# Patient Record
Sex: Male | Born: 1950
Health system: Southern US, Community
[De-identification: ages and names within clinical notes are randomized; demographics above are authoritative.]

## PROBLEM LIST (undated history)

## (undated) DIAGNOSIS — E119 Type 2 diabetes mellitus without complications: Secondary | ICD-10-CM

## (undated) DIAGNOSIS — E7800 Pure hypercholesterolemia, unspecified: Secondary | ICD-10-CM

## (undated) DIAGNOSIS — R0600 Dyspnea, unspecified: Secondary | ICD-10-CM

## (undated) DIAGNOSIS — I10 Essential (primary) hypertension: Secondary | ICD-10-CM

## (undated) DIAGNOSIS — J449 Chronic obstructive pulmonary disease, unspecified: Secondary | ICD-10-CM

## (undated) DIAGNOSIS — H409 Unspecified glaucoma: Secondary | ICD-10-CM

## (undated) DIAGNOSIS — E109 Type 1 diabetes mellitus without complications: Secondary | ICD-10-CM

## (undated) DIAGNOSIS — C61 Malignant neoplasm of prostate: Secondary | ICD-10-CM

## (undated) DIAGNOSIS — Z973 Presence of spectacles and contact lenses: Secondary | ICD-10-CM

## (undated) HISTORY — PX: PROSTATE BIOPSY: SHX241

## (undated) HISTORY — PX: NO PAST SURGERIES: SHX2092

---

## 2001-07-19 ENCOUNTER — Ambulatory Visit (HOSPITAL_COMMUNITY): Admission: RE | Admit: 2001-07-19 | Discharge: 2001-07-19 | Payer: Self-pay | Admitting: Family Medicine

## 2001-07-19 ENCOUNTER — Encounter: Payer: Self-pay | Admitting: Family Medicine

## 2004-11-26 ENCOUNTER — Ambulatory Visit (HOSPITAL_COMMUNITY): Admission: RE | Admit: 2004-11-26 | Discharge: 2004-11-26 | Payer: Self-pay | Admitting: Family Medicine

## 2008-03-04 ENCOUNTER — Encounter: Payer: Self-pay | Admitting: Internal Medicine

## 2008-03-04 ENCOUNTER — Ambulatory Visit (HOSPITAL_COMMUNITY): Admission: RE | Admit: 2008-03-04 | Discharge: 2008-03-04 | Payer: Self-pay | Admitting: Family Medicine

## 2008-03-10 ENCOUNTER — Ambulatory Visit (HOSPITAL_COMMUNITY): Admission: RE | Admit: 2008-03-10 | Discharge: 2008-03-10 | Payer: Self-pay | Admitting: Family Medicine

## 2008-03-10 ENCOUNTER — Encounter: Payer: Self-pay | Admitting: Internal Medicine

## 2008-03-24 ENCOUNTER — Ambulatory Visit: Payer: Self-pay | Admitting: Internal Medicine

## 2008-03-24 DIAGNOSIS — J449 Chronic obstructive pulmonary disease, unspecified: Secondary | ICD-10-CM | POA: Insufficient documentation

## 2008-03-24 DIAGNOSIS — J438 Other emphysema: Secondary | ICD-10-CM | POA: Insufficient documentation

## 2008-03-24 DIAGNOSIS — J4489 Other specified chronic obstructive pulmonary disease: Secondary | ICD-10-CM | POA: Insufficient documentation

## 2008-03-24 DIAGNOSIS — J189 Pneumonia, unspecified organism: Secondary | ICD-10-CM | POA: Insufficient documentation

## 2008-03-24 DIAGNOSIS — E785 Hyperlipidemia, unspecified: Secondary | ICD-10-CM | POA: Insufficient documentation

## 2008-04-10 ENCOUNTER — Ambulatory Visit: Payer: Self-pay | Admitting: Internal Medicine

## 2008-04-10 DIAGNOSIS — J984 Other disorders of lung: Secondary | ICD-10-CM

## 2008-07-21 ENCOUNTER — Telehealth (INDEPENDENT_AMBULATORY_CARE_PROVIDER_SITE_OTHER): Payer: Self-pay | Admitting: *Deleted

## 2008-07-29 ENCOUNTER — Encounter: Payer: Self-pay | Admitting: Internal Medicine

## 2008-08-04 ENCOUNTER — Ambulatory Visit (HOSPITAL_COMMUNITY): Admission: RE | Admit: 2008-08-04 | Discharge: 2008-08-04 | Payer: Self-pay | Admitting: Internal Medicine

## 2009-10-09 IMAGING — CR DG CHEST 2V
2 series · 2 of 2 positions shown · non-contrast
Comparison: None

CLINICAL DATA: Cough, wheezing, sore throat, fever

CHEST - 2 VIEW

[view not recorded (1 of 2)]
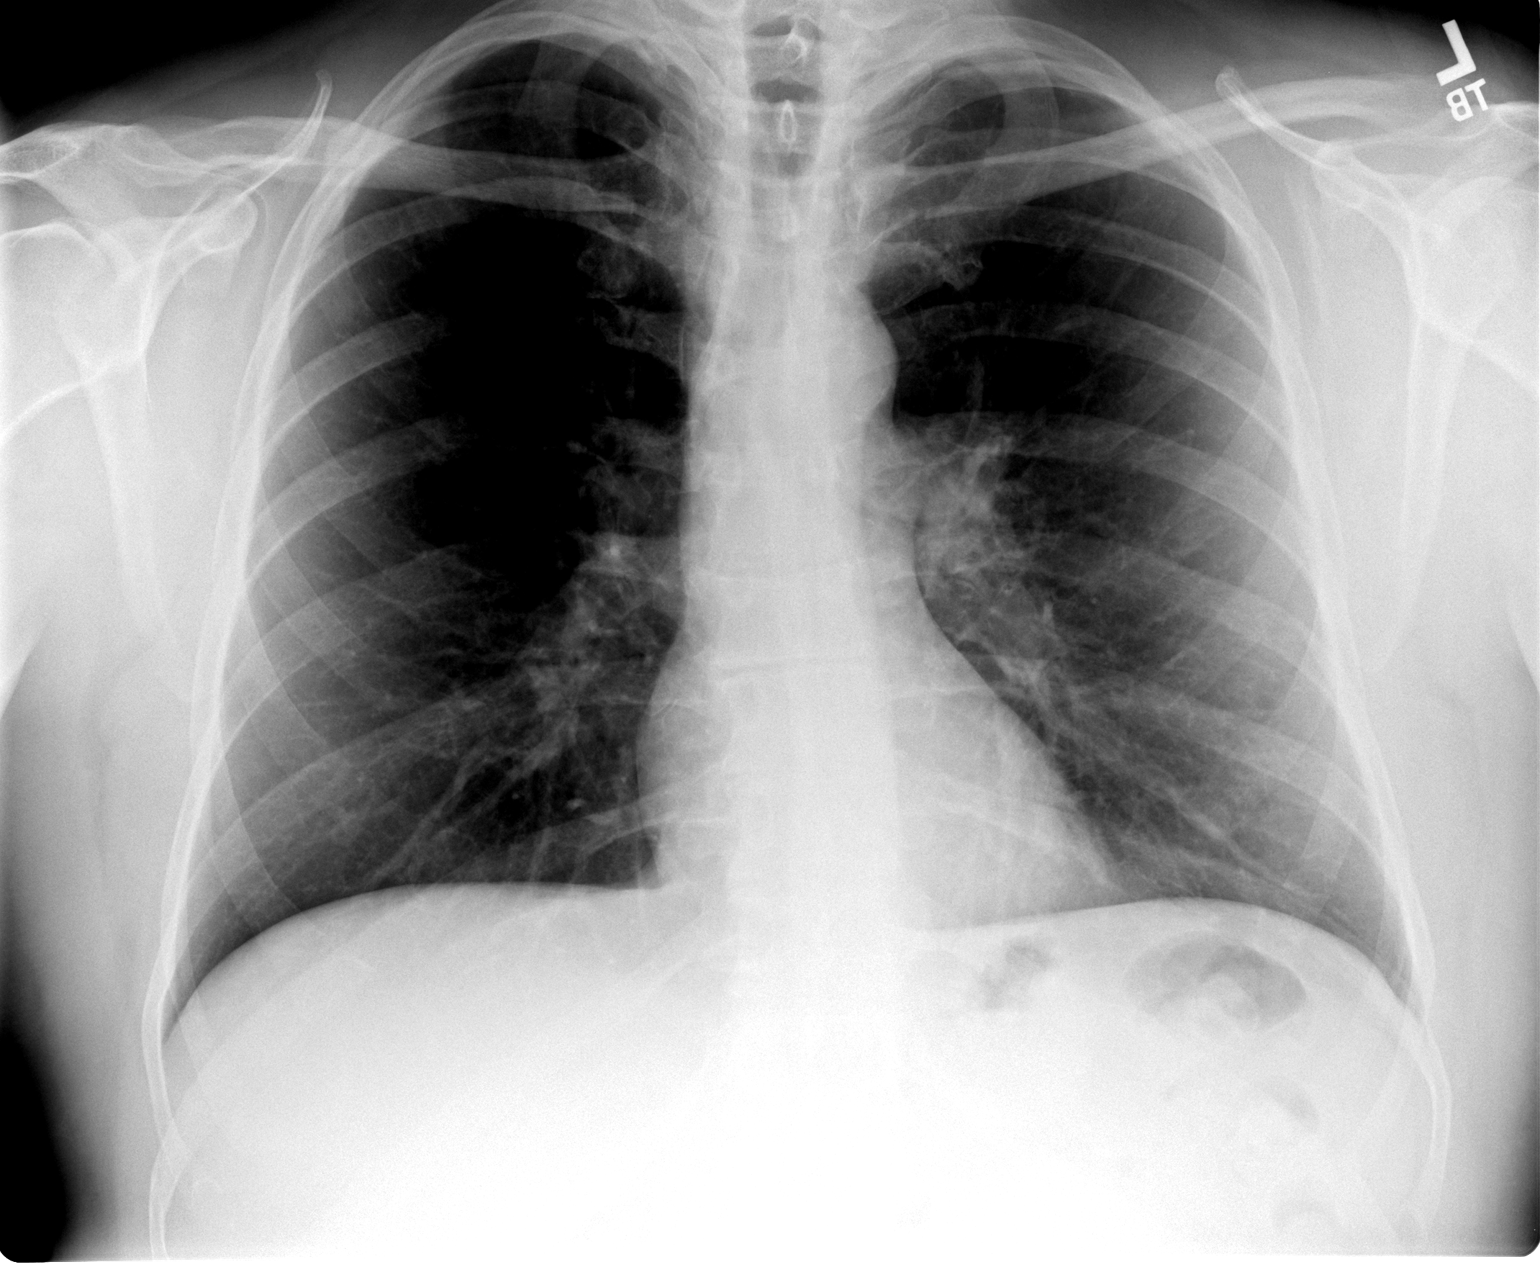

[view not recorded (2 of 2)]
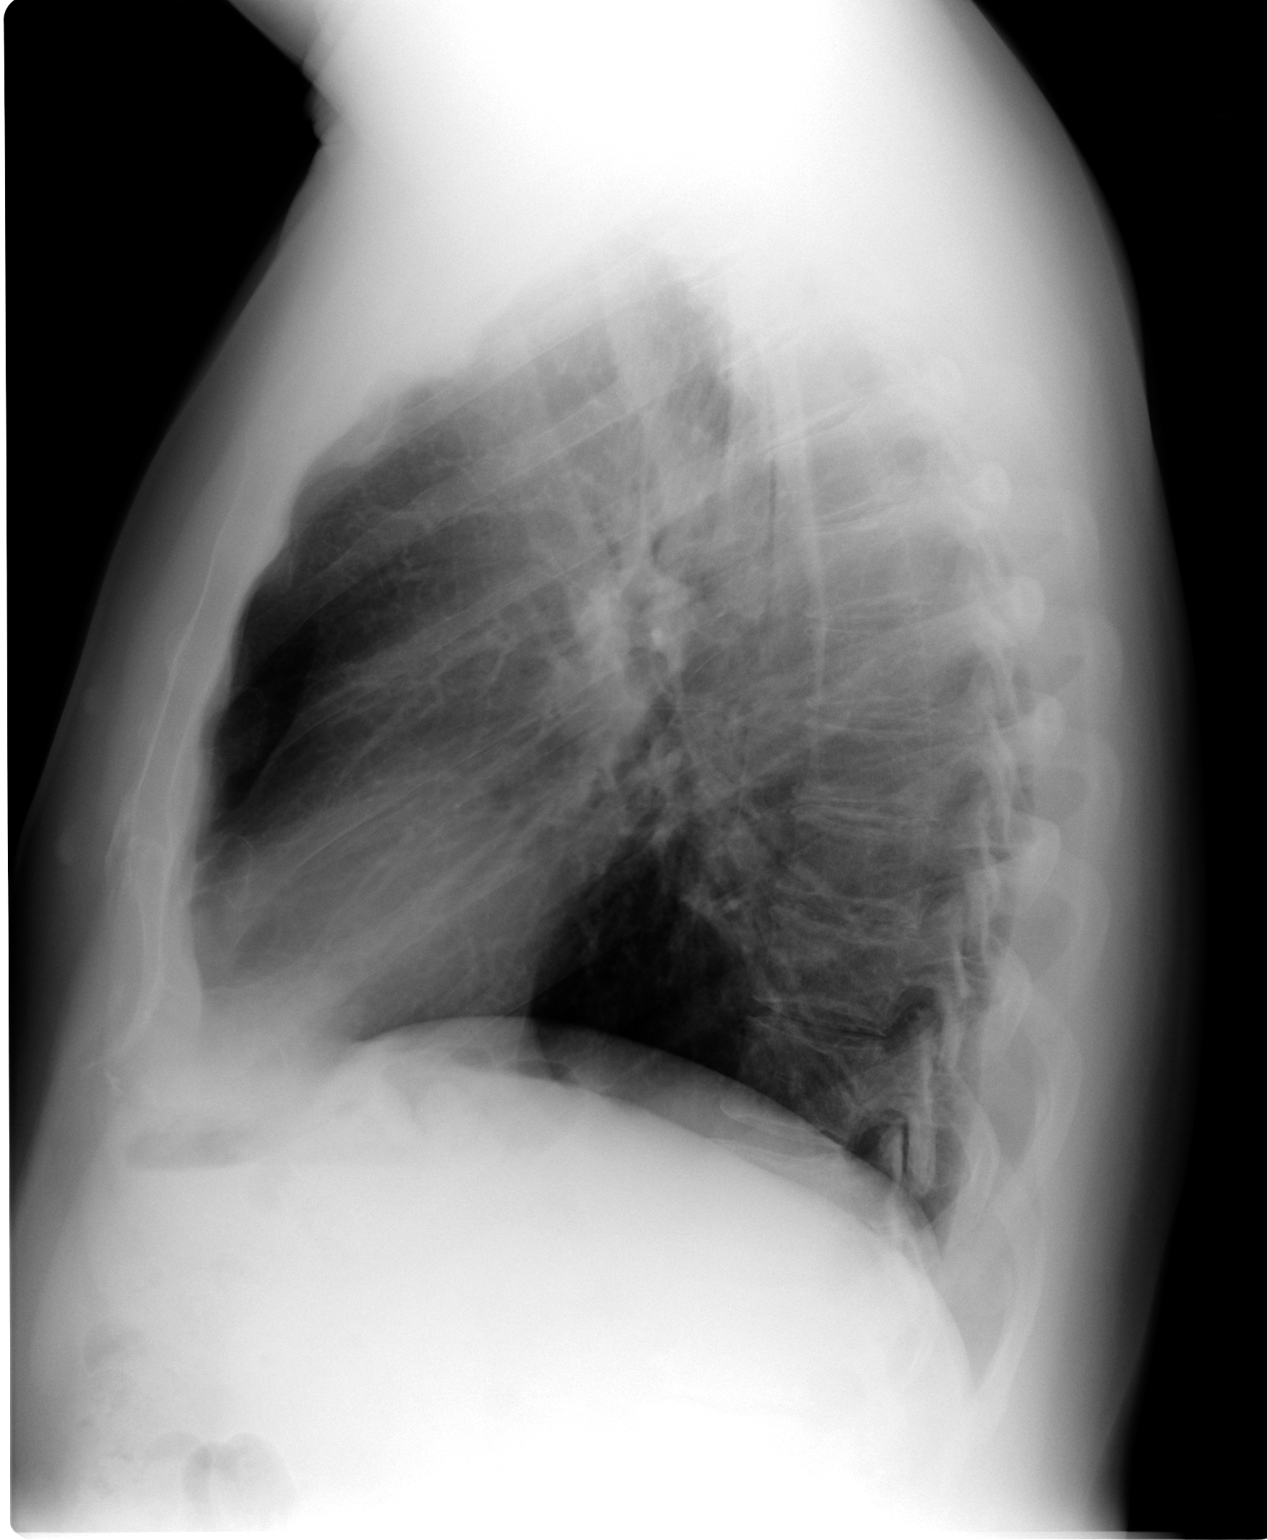

[2 of 2 positions shown; findings below may reference images not displayed]

FINDINGS: Normal heart size.
Slightly prominent left pulmonary hilum.
Intrapulmonary vascular markings normal.
No pulmonary infiltrate or pleural effusion.
Minimal bronchitic changes.
Bones unremarkable.
IMPRESSION: Minimal bronchitic changes.
Prominence of left pulmonary hilum, potentially related to vascular
markings but adenopathy not completely excluded.
If the patient has prior outside chest radiographs, recommend these
be obtained for comparison.
In the absence of prior studies, recommend CT chest with contrast
to exclude hilar adenopathy.

## 2009-11-15 IMAGING — CR DG CHEST 2V
2 series · 2 of 2 positions shown · non-contrast
Comparison: 03/04/2008

CLINICAL DATA: Follow up pneumonia.

CHEST - 2 VIEW

[view not recorded (1 of 2)]
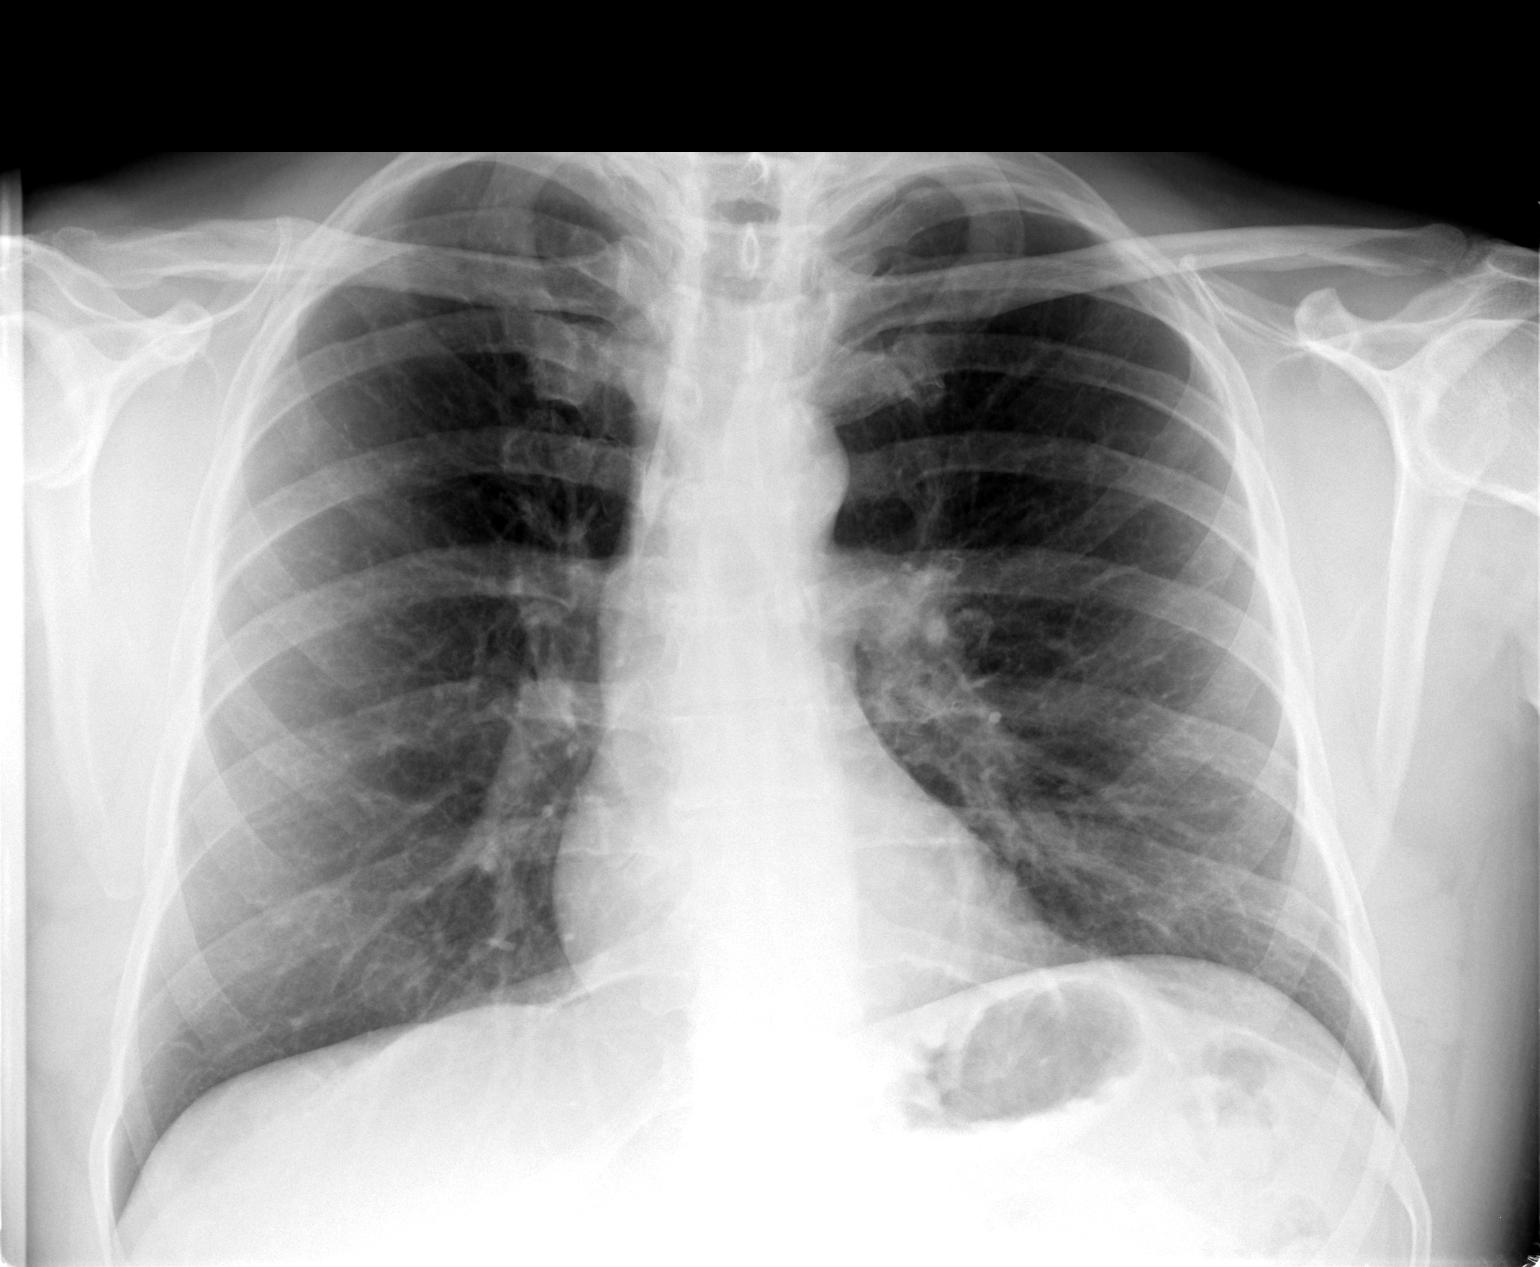

[view not recorded (2 of 2)]
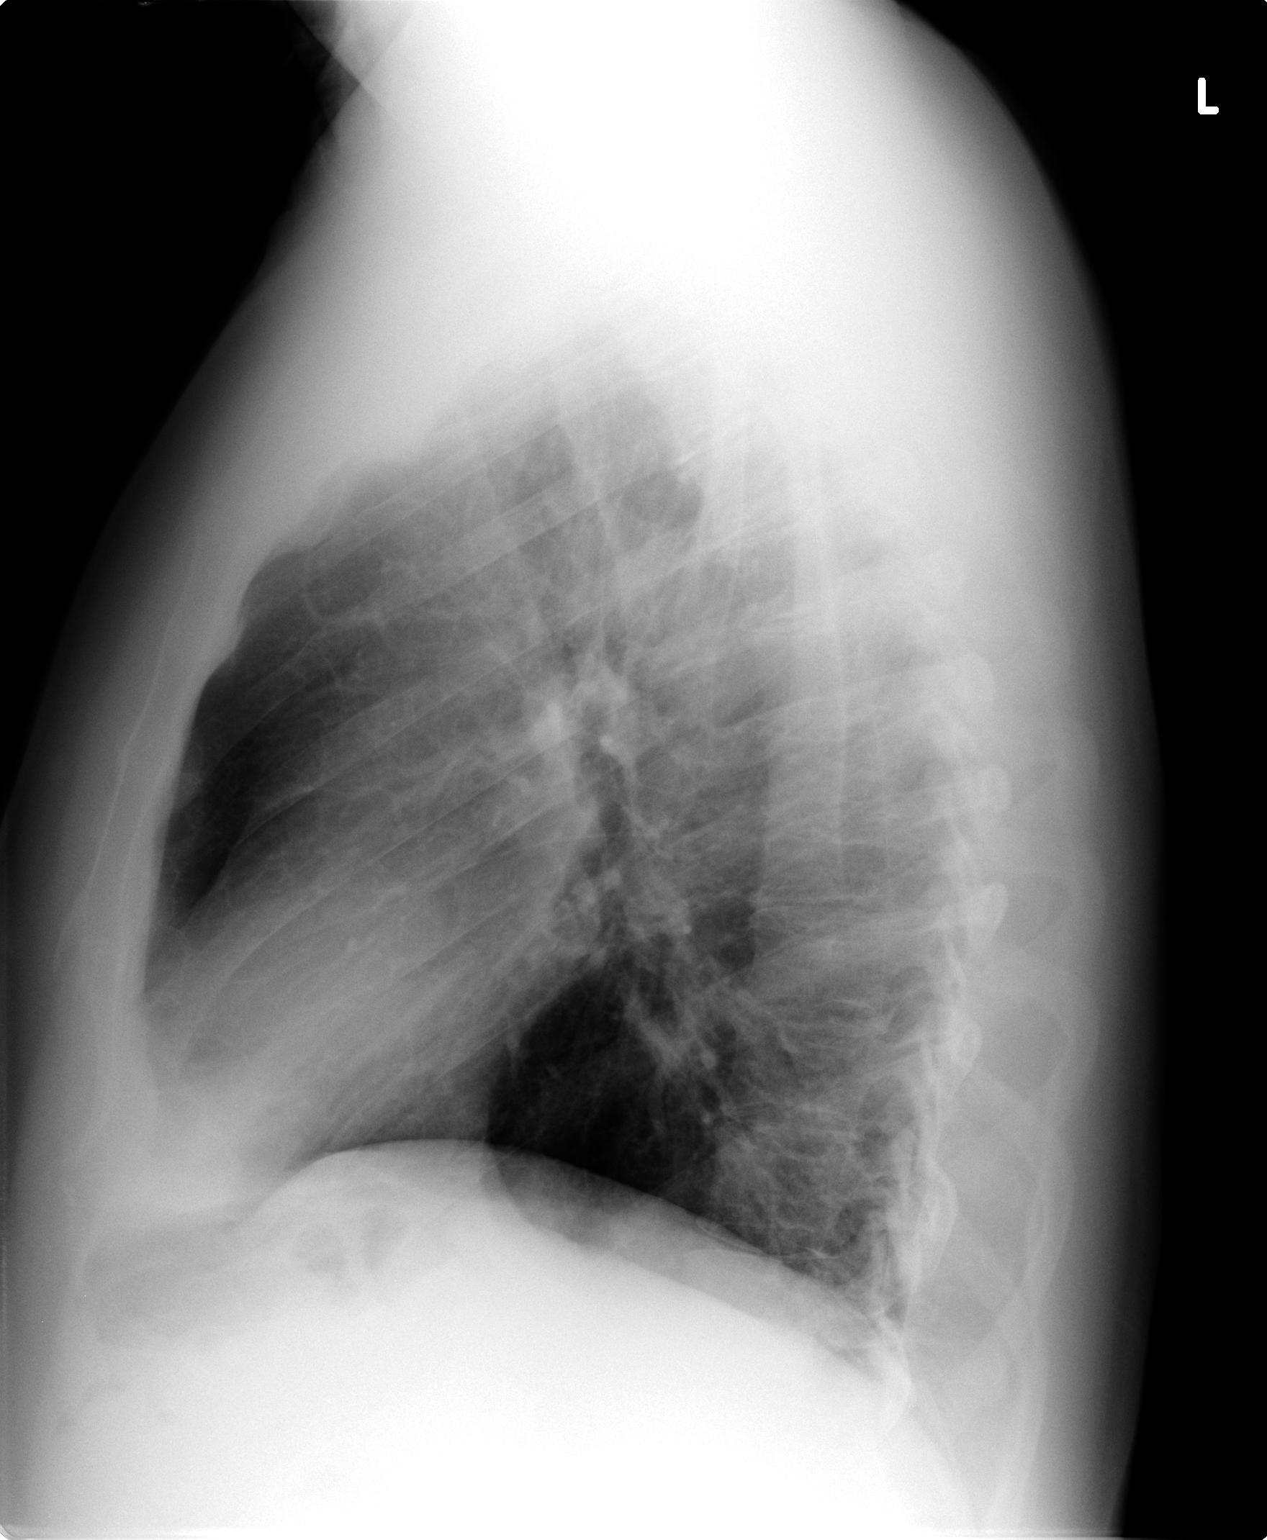

[2 of 2 positions shown; findings below may reference images not displayed]

FINDINGS: The heart size and mediastinal contours are within normal
limits.  Both lungs are clear.  The visualized skeletal structures
are unremarkable.
IMPRESSION: No active cardiopulmonary disease.

## 2010-12-08 ENCOUNTER — Ambulatory Visit (INDEPENDENT_AMBULATORY_CARE_PROVIDER_SITE_OTHER): Payer: Medicare Other | Admitting: Ophthalmology

## 2010-12-08 DIAGNOSIS — H2510 Age-related nuclear cataract, unspecified eye: Secondary | ICD-10-CM

## 2010-12-08 DIAGNOSIS — H43819 Vitreous degeneration, unspecified eye: Secondary | ICD-10-CM

## 2010-12-08 DIAGNOSIS — E11319 Type 2 diabetes mellitus with unspecified diabetic retinopathy without macular edema: Secondary | ICD-10-CM

## 2010-12-08 DIAGNOSIS — H3581 Retinal edema: Secondary | ICD-10-CM

## 2011-04-08 ENCOUNTER — Ambulatory Visit (INDEPENDENT_AMBULATORY_CARE_PROVIDER_SITE_OTHER): Payer: Medicare Other | Admitting: Ophthalmology

## 2011-08-02 DIAGNOSIS — E109 Type 1 diabetes mellitus without complications: Secondary | ICD-10-CM | POA: Insufficient documentation

## 2011-10-17 ENCOUNTER — Ambulatory Visit (INDEPENDENT_AMBULATORY_CARE_PROVIDER_SITE_OTHER): Payer: Managed Care, Other (non HMO) | Admitting: Ophthalmology

## 2011-10-17 DIAGNOSIS — E11319 Type 2 diabetes mellitus with unspecified diabetic retinopathy without macular edema: Secondary | ICD-10-CM

## 2011-10-17 DIAGNOSIS — E11359 Type 2 diabetes mellitus with proliferative diabetic retinopathy without macular edema: Secondary | ICD-10-CM

## 2011-10-17 DIAGNOSIS — H43819 Vitreous degeneration, unspecified eye: Secondary | ICD-10-CM

## 2011-10-17 DIAGNOSIS — E1039 Type 1 diabetes mellitus with other diabetic ophthalmic complication: Secondary | ICD-10-CM

## 2011-10-17 DIAGNOSIS — E11311 Type 2 diabetes mellitus with unspecified diabetic retinopathy with macular edema: Secondary | ICD-10-CM

## 2011-10-17 DIAGNOSIS — H35039 Hypertensive retinopathy, unspecified eye: Secondary | ICD-10-CM

## 2011-10-17 DIAGNOSIS — I10 Essential (primary) hypertension: Secondary | ICD-10-CM

## 2011-10-17 DIAGNOSIS — H2510 Age-related nuclear cataract, unspecified eye: Secondary | ICD-10-CM

## 2011-10-26 ENCOUNTER — Ambulatory Visit (INDEPENDENT_AMBULATORY_CARE_PROVIDER_SITE_OTHER): Payer: Managed Care, Other (non HMO) | Admitting: Ophthalmology

## 2011-10-26 DIAGNOSIS — E11311 Type 2 diabetes mellitus with unspecified diabetic retinopathy with macular edema: Secondary | ICD-10-CM

## 2011-10-26 DIAGNOSIS — H2510 Age-related nuclear cataract, unspecified eye: Secondary | ICD-10-CM

## 2011-10-26 DIAGNOSIS — H35039 Hypertensive retinopathy, unspecified eye: Secondary | ICD-10-CM

## 2011-10-26 DIAGNOSIS — E1039 Type 1 diabetes mellitus with other diabetic ophthalmic complication: Secondary | ICD-10-CM

## 2011-10-26 DIAGNOSIS — E11359 Type 2 diabetes mellitus with proliferative diabetic retinopathy without macular edema: Secondary | ICD-10-CM

## 2011-10-26 DIAGNOSIS — I10 Essential (primary) hypertension: Secondary | ICD-10-CM

## 2011-10-26 DIAGNOSIS — H43819 Vitreous degeneration, unspecified eye: Secondary | ICD-10-CM

## 2011-10-26 DIAGNOSIS — E11319 Type 2 diabetes mellitus with unspecified diabetic retinopathy without macular edema: Secondary | ICD-10-CM

## 2011-11-21 ENCOUNTER — Encounter (INDEPENDENT_AMBULATORY_CARE_PROVIDER_SITE_OTHER): Payer: Managed Care, Other (non HMO) | Admitting: Ophthalmology

## 2011-11-21 DIAGNOSIS — H35039 Hypertensive retinopathy, unspecified eye: Secondary | ICD-10-CM

## 2011-11-21 DIAGNOSIS — H43819 Vitreous degeneration, unspecified eye: Secondary | ICD-10-CM

## 2011-11-21 DIAGNOSIS — I10 Essential (primary) hypertension: Secondary | ICD-10-CM

## 2011-11-21 DIAGNOSIS — E1039 Type 1 diabetes mellitus with other diabetic ophthalmic complication: Secondary | ICD-10-CM

## 2011-11-21 DIAGNOSIS — E1065 Type 1 diabetes mellitus with hyperglycemia: Secondary | ICD-10-CM

## 2011-11-21 DIAGNOSIS — E11359 Type 2 diabetes mellitus with proliferative diabetic retinopathy without macular edema: Secondary | ICD-10-CM

## 2011-11-21 DIAGNOSIS — H2510 Age-related nuclear cataract, unspecified eye: Secondary | ICD-10-CM

## 2011-11-21 DIAGNOSIS — E11311 Type 2 diabetes mellitus with unspecified diabetic retinopathy with macular edema: Secondary | ICD-10-CM

## 2011-11-21 DIAGNOSIS — E11319 Type 2 diabetes mellitus with unspecified diabetic retinopathy without macular edema: Secondary | ICD-10-CM

## 2011-12-26 ENCOUNTER — Encounter (INDEPENDENT_AMBULATORY_CARE_PROVIDER_SITE_OTHER): Payer: Managed Care, Other (non HMO) | Admitting: Ophthalmology

## 2011-12-26 DIAGNOSIS — E11311 Type 2 diabetes mellitus with unspecified diabetic retinopathy with macular edema: Secondary | ICD-10-CM

## 2011-12-26 DIAGNOSIS — I10 Essential (primary) hypertension: Secondary | ICD-10-CM

## 2011-12-26 DIAGNOSIS — E1039 Type 1 diabetes mellitus with other diabetic ophthalmic complication: Secondary | ICD-10-CM

## 2011-12-26 DIAGNOSIS — H2510 Age-related nuclear cataract, unspecified eye: Secondary | ICD-10-CM

## 2011-12-26 DIAGNOSIS — E11359 Type 2 diabetes mellitus with proliferative diabetic retinopathy without macular edema: Secondary | ICD-10-CM

## 2011-12-26 DIAGNOSIS — E11319 Type 2 diabetes mellitus with unspecified diabetic retinopathy without macular edema: Secondary | ICD-10-CM

## 2011-12-26 DIAGNOSIS — H35039 Hypertensive retinopathy, unspecified eye: Secondary | ICD-10-CM

## 2012-01-23 ENCOUNTER — Encounter (INDEPENDENT_AMBULATORY_CARE_PROVIDER_SITE_OTHER): Payer: Managed Care, Other (non HMO) | Admitting: Ophthalmology

## 2012-01-23 DIAGNOSIS — E11311 Type 2 diabetes mellitus with unspecified diabetic retinopathy with macular edema: Secondary | ICD-10-CM

## 2012-01-23 DIAGNOSIS — H35039 Hypertensive retinopathy, unspecified eye: Secondary | ICD-10-CM

## 2012-01-23 DIAGNOSIS — H43819 Vitreous degeneration, unspecified eye: Secondary | ICD-10-CM

## 2012-01-23 DIAGNOSIS — E11319 Type 2 diabetes mellitus with unspecified diabetic retinopathy without macular edema: Secondary | ICD-10-CM

## 2012-01-23 DIAGNOSIS — H2510 Age-related nuclear cataract, unspecified eye: Secondary | ICD-10-CM

## 2012-01-23 DIAGNOSIS — E1039 Type 1 diabetes mellitus with other diabetic ophthalmic complication: Secondary | ICD-10-CM

## 2012-01-23 DIAGNOSIS — E1065 Type 1 diabetes mellitus with hyperglycemia: Secondary | ICD-10-CM

## 2012-01-23 DIAGNOSIS — I10 Essential (primary) hypertension: Secondary | ICD-10-CM

## 2012-01-23 DIAGNOSIS — E11359 Type 2 diabetes mellitus with proliferative diabetic retinopathy without macular edema: Secondary | ICD-10-CM

## 2012-02-20 ENCOUNTER — Encounter (INDEPENDENT_AMBULATORY_CARE_PROVIDER_SITE_OTHER): Payer: Managed Care, Other (non HMO) | Admitting: Ophthalmology

## 2012-02-20 DIAGNOSIS — E11319 Type 2 diabetes mellitus with unspecified diabetic retinopathy without macular edema: Secondary | ICD-10-CM

## 2012-02-20 DIAGNOSIS — H43819 Vitreous degeneration, unspecified eye: Secondary | ICD-10-CM

## 2012-02-20 DIAGNOSIS — E1065 Type 1 diabetes mellitus with hyperglycemia: Secondary | ICD-10-CM

## 2012-02-20 DIAGNOSIS — E11311 Type 2 diabetes mellitus with unspecified diabetic retinopathy with macular edema: Secondary | ICD-10-CM

## 2012-02-20 DIAGNOSIS — I10 Essential (primary) hypertension: Secondary | ICD-10-CM

## 2012-02-20 DIAGNOSIS — H35039 Hypertensive retinopathy, unspecified eye: Secondary | ICD-10-CM

## 2012-02-20 DIAGNOSIS — H2510 Age-related nuclear cataract, unspecified eye: Secondary | ICD-10-CM

## 2012-02-20 DIAGNOSIS — E11359 Type 2 diabetes mellitus with proliferative diabetic retinopathy without macular edema: Secondary | ICD-10-CM

## 2012-03-08 ENCOUNTER — Other Ambulatory Visit (INDEPENDENT_AMBULATORY_CARE_PROVIDER_SITE_OTHER): Payer: Managed Care, Other (non HMO) | Admitting: Ophthalmology

## 2012-03-08 DIAGNOSIS — H3581 Retinal edema: Secondary | ICD-10-CM

## 2012-03-21 ENCOUNTER — Encounter (INDEPENDENT_AMBULATORY_CARE_PROVIDER_SITE_OTHER): Payer: Managed Care, Other (non HMO) | Admitting: Ophthalmology

## 2012-03-21 DIAGNOSIS — H2510 Age-related nuclear cataract, unspecified eye: Secondary | ICD-10-CM

## 2012-03-21 DIAGNOSIS — E1139 Type 2 diabetes mellitus with other diabetic ophthalmic complication: Secondary | ICD-10-CM

## 2012-03-21 DIAGNOSIS — E11359 Type 2 diabetes mellitus with proliferative diabetic retinopathy without macular edema: Secondary | ICD-10-CM

## 2012-03-21 DIAGNOSIS — E11311 Type 2 diabetes mellitus with unspecified diabetic retinopathy with macular edema: Secondary | ICD-10-CM

## 2012-03-21 DIAGNOSIS — E11319 Type 2 diabetes mellitus with unspecified diabetic retinopathy without macular edema: Secondary | ICD-10-CM

## 2012-03-21 DIAGNOSIS — H35039 Hypertensive retinopathy, unspecified eye: Secondary | ICD-10-CM

## 2012-03-21 DIAGNOSIS — I10 Essential (primary) hypertension: Secondary | ICD-10-CM

## 2012-03-21 DIAGNOSIS — H43819 Vitreous degeneration, unspecified eye: Secondary | ICD-10-CM

## 2012-04-26 ENCOUNTER — Encounter (INDEPENDENT_AMBULATORY_CARE_PROVIDER_SITE_OTHER): Payer: Managed Care, Other (non HMO) | Admitting: Ophthalmology

## 2012-04-26 DIAGNOSIS — E11319 Type 2 diabetes mellitus with unspecified diabetic retinopathy without macular edema: Secondary | ICD-10-CM

## 2012-04-26 DIAGNOSIS — H35039 Hypertensive retinopathy, unspecified eye: Secondary | ICD-10-CM

## 2012-04-26 DIAGNOSIS — E11359 Type 2 diabetes mellitus with proliferative diabetic retinopathy without macular edema: Secondary | ICD-10-CM

## 2012-04-26 DIAGNOSIS — E1165 Type 2 diabetes mellitus with hyperglycemia: Secondary | ICD-10-CM

## 2012-04-26 DIAGNOSIS — I10 Essential (primary) hypertension: Secondary | ICD-10-CM

## 2012-04-26 DIAGNOSIS — H2510 Age-related nuclear cataract, unspecified eye: Secondary | ICD-10-CM

## 2012-04-26 DIAGNOSIS — E11311 Type 2 diabetes mellitus with unspecified diabetic retinopathy with macular edema: Secondary | ICD-10-CM

## 2012-04-26 DIAGNOSIS — E1139 Type 2 diabetes mellitus with other diabetic ophthalmic complication: Secondary | ICD-10-CM

## 2012-06-07 ENCOUNTER — Encounter (INDEPENDENT_AMBULATORY_CARE_PROVIDER_SITE_OTHER): Payer: Managed Care, Other (non HMO) | Admitting: Ophthalmology

## 2012-06-07 DIAGNOSIS — I10 Essential (primary) hypertension: Secondary | ICD-10-CM

## 2012-06-07 DIAGNOSIS — H43819 Vitreous degeneration, unspecified eye: Secondary | ICD-10-CM

## 2012-06-07 DIAGNOSIS — H35039 Hypertensive retinopathy, unspecified eye: Secondary | ICD-10-CM

## 2012-06-07 DIAGNOSIS — E11359 Type 2 diabetes mellitus with proliferative diabetic retinopathy without macular edema: Secondary | ICD-10-CM

## 2012-06-07 DIAGNOSIS — H2510 Age-related nuclear cataract, unspecified eye: Secondary | ICD-10-CM

## 2012-06-07 DIAGNOSIS — E11311 Type 2 diabetes mellitus with unspecified diabetic retinopathy with macular edema: Secondary | ICD-10-CM

## 2012-07-10 ENCOUNTER — Encounter (INDEPENDENT_AMBULATORY_CARE_PROVIDER_SITE_OTHER): Payer: Managed Care, Other (non HMO) | Admitting: Ophthalmology

## 2012-07-12 ENCOUNTER — Encounter (INDEPENDENT_AMBULATORY_CARE_PROVIDER_SITE_OTHER): Payer: Managed Care, Other (non HMO) | Admitting: Ophthalmology

## 2012-07-12 DIAGNOSIS — E11359 Type 2 diabetes mellitus with proliferative diabetic retinopathy without macular edema: Secondary | ICD-10-CM

## 2012-07-12 DIAGNOSIS — E11311 Type 2 diabetes mellitus with unspecified diabetic retinopathy with macular edema: Secondary | ICD-10-CM

## 2012-07-12 DIAGNOSIS — E1039 Type 1 diabetes mellitus with other diabetic ophthalmic complication: Secondary | ICD-10-CM

## 2012-07-12 DIAGNOSIS — H43819 Vitreous degeneration, unspecified eye: Secondary | ICD-10-CM

## 2012-07-12 DIAGNOSIS — H35039 Hypertensive retinopathy, unspecified eye: Secondary | ICD-10-CM

## 2012-07-12 DIAGNOSIS — E11319 Type 2 diabetes mellitus with unspecified diabetic retinopathy without macular edema: Secondary | ICD-10-CM

## 2012-07-12 DIAGNOSIS — I10 Essential (primary) hypertension: Secondary | ICD-10-CM

## 2012-07-12 DIAGNOSIS — H2510 Age-related nuclear cataract, unspecified eye: Secondary | ICD-10-CM

## 2012-08-23 ENCOUNTER — Encounter (INDEPENDENT_AMBULATORY_CARE_PROVIDER_SITE_OTHER): Payer: Managed Care, Other (non HMO) | Admitting: Ophthalmology

## 2012-08-23 DIAGNOSIS — H35039 Hypertensive retinopathy, unspecified eye: Secondary | ICD-10-CM

## 2012-08-23 DIAGNOSIS — E11311 Type 2 diabetes mellitus with unspecified diabetic retinopathy with macular edema: Secondary | ICD-10-CM

## 2012-08-23 DIAGNOSIS — I10 Essential (primary) hypertension: Secondary | ICD-10-CM

## 2012-08-23 DIAGNOSIS — E1039 Type 1 diabetes mellitus with other diabetic ophthalmic complication: Secondary | ICD-10-CM

## 2012-08-23 DIAGNOSIS — H43819 Vitreous degeneration, unspecified eye: Secondary | ICD-10-CM

## 2012-08-23 DIAGNOSIS — E11319 Type 2 diabetes mellitus with unspecified diabetic retinopathy without macular edema: Secondary | ICD-10-CM

## 2012-08-23 DIAGNOSIS — E11359 Type 2 diabetes mellitus with proliferative diabetic retinopathy without macular edema: Secondary | ICD-10-CM

## 2012-10-04 ENCOUNTER — Encounter (INDEPENDENT_AMBULATORY_CARE_PROVIDER_SITE_OTHER): Payer: Managed Care, Other (non HMO) | Admitting: Ophthalmology

## 2012-10-04 DIAGNOSIS — I10 Essential (primary) hypertension: Secondary | ICD-10-CM

## 2012-10-04 DIAGNOSIS — H35039 Hypertensive retinopathy, unspecified eye: Secondary | ICD-10-CM

## 2012-10-04 DIAGNOSIS — H2510 Age-related nuclear cataract, unspecified eye: Secondary | ICD-10-CM

## 2012-10-04 DIAGNOSIS — E1039 Type 1 diabetes mellitus with other diabetic ophthalmic complication: Secondary | ICD-10-CM

## 2012-10-04 DIAGNOSIS — E11359 Type 2 diabetes mellitus with proliferative diabetic retinopathy without macular edema: Secondary | ICD-10-CM

## 2012-10-04 DIAGNOSIS — E11319 Type 2 diabetes mellitus with unspecified diabetic retinopathy without macular edema: Secondary | ICD-10-CM

## 2012-10-04 DIAGNOSIS — H43819 Vitreous degeneration, unspecified eye: Secondary | ICD-10-CM

## 2012-10-04 DIAGNOSIS — E11311 Type 2 diabetes mellitus with unspecified diabetic retinopathy with macular edema: Secondary | ICD-10-CM

## 2012-11-08 ENCOUNTER — Encounter (INDEPENDENT_AMBULATORY_CARE_PROVIDER_SITE_OTHER): Payer: Managed Care, Other (non HMO) | Admitting: Ophthalmology

## 2012-11-08 DIAGNOSIS — E11359 Type 2 diabetes mellitus with proliferative diabetic retinopathy without macular edema: Secondary | ICD-10-CM

## 2012-11-08 DIAGNOSIS — H2510 Age-related nuclear cataract, unspecified eye: Secondary | ICD-10-CM

## 2012-11-08 DIAGNOSIS — H35039 Hypertensive retinopathy, unspecified eye: Secondary | ICD-10-CM

## 2012-11-08 DIAGNOSIS — H43819 Vitreous degeneration, unspecified eye: Secondary | ICD-10-CM

## 2012-11-08 DIAGNOSIS — E11319 Type 2 diabetes mellitus with unspecified diabetic retinopathy without macular edema: Secondary | ICD-10-CM

## 2012-11-08 DIAGNOSIS — E11311 Type 2 diabetes mellitus with unspecified diabetic retinopathy with macular edema: Secondary | ICD-10-CM

## 2012-11-08 DIAGNOSIS — E1065 Type 1 diabetes mellitus with hyperglycemia: Secondary | ICD-10-CM

## 2012-11-08 DIAGNOSIS — I10 Essential (primary) hypertension: Secondary | ICD-10-CM

## 2013-01-09 ENCOUNTER — Encounter (INDEPENDENT_AMBULATORY_CARE_PROVIDER_SITE_OTHER): Payer: Managed Care, Other (non HMO) | Admitting: Ophthalmology

## 2013-01-09 DIAGNOSIS — I10 Essential (primary) hypertension: Secondary | ICD-10-CM

## 2013-01-09 DIAGNOSIS — E11359 Type 2 diabetes mellitus with proliferative diabetic retinopathy without macular edema: Secondary | ICD-10-CM

## 2013-01-09 DIAGNOSIS — E11311 Type 2 diabetes mellitus with unspecified diabetic retinopathy with macular edema: Secondary | ICD-10-CM

## 2013-01-09 DIAGNOSIS — E11319 Type 2 diabetes mellitus with unspecified diabetic retinopathy without macular edema: Secondary | ICD-10-CM

## 2013-01-09 DIAGNOSIS — E1039 Type 1 diabetes mellitus with other diabetic ophthalmic complication: Secondary | ICD-10-CM

## 2013-01-09 DIAGNOSIS — H35039 Hypertensive retinopathy, unspecified eye: Secondary | ICD-10-CM

## 2013-02-05 ENCOUNTER — Telehealth: Payer: Self-pay

## 2013-02-05 NOTE — Telephone Encounter (Signed)
Pt was referred by Dr. McGough for screening colonoscopy. LMOM for a return call.  

## 2013-02-08 DIAGNOSIS — I10 Essential (primary) hypertension: Secondary | ICD-10-CM | POA: Insufficient documentation

## 2013-02-21 NOTE — Telephone Encounter (Signed)
Letter to PCP

## 2013-03-08 NOTE — Telephone Encounter (Signed)
Patient called back to schedule his screening colonoscopy.  He can be reached at [phone].  Routing to [employer]

## 2013-03-11 ENCOUNTER — Encounter (INDEPENDENT_AMBULATORY_CARE_PROVIDER_SITE_OTHER): Payer: Managed Care, Other (non HMO) | Admitting: Ophthalmology

## 2013-03-11 DIAGNOSIS — H43819 Vitreous degeneration, unspecified eye: Secondary | ICD-10-CM

## 2013-03-11 DIAGNOSIS — H2510 Age-related nuclear cataract, unspecified eye: Secondary | ICD-10-CM

## 2013-03-11 DIAGNOSIS — H35039 Hypertensive retinopathy, unspecified eye: Secondary | ICD-10-CM

## 2013-03-11 DIAGNOSIS — E11359 Type 2 diabetes mellitus with proliferative diabetic retinopathy without macular edema: Secondary | ICD-10-CM

## 2013-03-11 DIAGNOSIS — I10 Essential (primary) hypertension: Secondary | ICD-10-CM

## 2013-03-11 DIAGNOSIS — E11319 Type 2 diabetes mellitus with unspecified diabetic retinopathy without macular edema: Secondary | ICD-10-CM

## 2013-03-11 DIAGNOSIS — E11311 Type 2 diabetes mellitus with unspecified diabetic retinopathy with macular edema: Secondary | ICD-10-CM

## 2013-03-11 DIAGNOSIS — E1139 Type 2 diabetes mellitus with other diabetic ophthalmic complication: Secondary | ICD-10-CM

## 2013-03-13 ENCOUNTER — Telehealth: Payer: Self-pay

## 2013-03-13 ENCOUNTER — Other Ambulatory Visit: Payer: Self-pay

## 2013-03-13 DIAGNOSIS — Z1211 Encounter for screening for malignant neoplasm of colon: Secondary | ICD-10-CM

## 2013-03-18 NOTE — Telephone Encounter (Signed)
Gastroenterology Pre-Procedure Review  Request Date: 03/13/2013 Requesting Physician: Dr. Regino Schultze  PATIENT REVIEW QUESTIONS: The patient responded to the following health history questions as indicated:    1. Diabetes Melitis: YES 2. Joint replacements in the past 12 months: no 3. Major health problems in the past 3 months: no 4. Has an artificial valve or MVP: no 5. Has a defibrillator: no 6. Has been advised in past to take antibiotics in advance of a procedure like teeth cleaning: no    MEDICATIONS & ALLERGIES:    Patient reports the following regarding taking any blood thinners:   Plavix? no Aspirin? no Coumadin? no  Patient confirms/reports the following medications:  Current Outpatient Prescriptions  Medication Sig Dispense Refill  . atorvastatin (LIPITOR) 40 MG tablet Take 40 mg by mouth daily.      . enalapril (VASOTEC) 10 MG tablet Take 10 mg by mouth daily.      Marland Kitchen FLUoxetine (PROZAC) 20 MG capsule Take 20 mg by mouth daily. PT TAKES 60 MG DAILY      . hydrochlorothiazide (HYDRODIURIL) 25 MG tablet Take 25 mg by mouth daily.      . NON FORMULARY HUMULOG INSULIN PUMP       No current facility-administered medications for this visit.    Patient confirms/reports the following allergies:  Not on File  No orders of the defined types were placed in this encounter.    AUTHORIZATION INFORMATION Primary Insurance:   ID #:   Group #:  Pre-Cert / Auth required Pre-Cert / Auth #:   Secondary Insurance:   ID #:  Group #:  Pre-Cert / Auth required:  Pre-Cert / Auth #:   SCHEDULE INFORMATION: Procedure has been scheduled as follows:  Date: 04/05/2013                Time: 8:30 AM  Location: [facility] Short Stay  This Gastroenterology Pre-Precedure Review Form is being routed to the following provider(s): Jonette Eva, MD

## 2013-03-18 NOTE — Telephone Encounter (Signed)
Please see separate triage.

## 2013-03-18 NOTE — Telephone Encounter (Signed)
MOVI PREP SPLIT DOSING- CLEAR LIQUIDS WITH BREAKFAST.  TAKE HALF HCTZ ON DAY BEFORE TCS. HOLD HCTZ ON DAY OF TCS.

## 2013-03-19 MED ORDER — PEG-KCL-NACL-NASULF-NA ASC-C 100 G PO SOLR: 1.0000 | ORAL | Status: DC

## 2013-03-19 NOTE — Telephone Encounter (Signed)
Rx sent to the pharmacy and instructions mailed to pt.  

## 2013-04-01 ENCOUNTER — Encounter (HOSPITAL_COMMUNITY): Payer: Self-pay | Admitting: Pharmacy Technician

## 2013-04-04 ENCOUNTER — Other Ambulatory Visit: Payer: Self-pay | Admitting: Gastroenterology

## 2013-04-04 DIAGNOSIS — Z1211 Encounter for screening for malignant neoplasm of colon: Secondary | ICD-10-CM

## 2013-04-05 ENCOUNTER — Ambulatory Visit (HOSPITAL_COMMUNITY)
Admission: RE | Admit: 2013-04-05 | Discharge: 2013-04-05 | Disposition: A | Discharge status: Home / Self Care | Payer: Managed Care, Other (non HMO) | Source: Ambulatory Visit | Attending: Gastroenterology | Admitting: Gastroenterology

## 2013-04-05 ENCOUNTER — Encounter (HOSPITAL_COMMUNITY): Payer: Self-pay | Admitting: *Deleted

## 2013-04-05 ENCOUNTER — Encounter (HOSPITAL_COMMUNITY)
Admission: RE | Disposition: A | Discharge status: Home / Self Care | Payer: Self-pay | Source: Ambulatory Visit | Attending: Gastroenterology

## 2013-04-05 DIAGNOSIS — K621 Rectal polyp: Secondary | ICD-10-CM

## 2013-04-05 DIAGNOSIS — I10 Essential (primary) hypertension: Secondary | ICD-10-CM | POA: Insufficient documentation

## 2013-04-05 DIAGNOSIS — K648 Other hemorrhoids: Secondary | ICD-10-CM | POA: Insufficient documentation

## 2013-04-05 DIAGNOSIS — J4489 Other specified chronic obstructive pulmonary disease: Secondary | ICD-10-CM | POA: Insufficient documentation

## 2013-04-05 DIAGNOSIS — D126 Benign neoplasm of colon, unspecified: Secondary | ICD-10-CM

## 2013-04-05 DIAGNOSIS — Z794 Long term (current) use of insulin: Secondary | ICD-10-CM | POA: Insufficient documentation

## 2013-04-05 DIAGNOSIS — Z1211 Encounter for screening for malignant neoplasm of colon: Secondary | ICD-10-CM

## 2013-04-05 DIAGNOSIS — K620 Anal polyp: Secondary | ICD-10-CM

## 2013-04-05 DIAGNOSIS — Z9641 Presence of insulin pump (external) (internal): Secondary | ICD-10-CM | POA: Insufficient documentation

## 2013-04-05 DIAGNOSIS — J449 Chronic obstructive pulmonary disease, unspecified: Secondary | ICD-10-CM | POA: Insufficient documentation

## 2013-04-05 DIAGNOSIS — D128 Benign neoplasm of rectum: Secondary | ICD-10-CM | POA: Insufficient documentation

## 2013-04-05 DIAGNOSIS — E7800 Pure hypercholesterolemia, unspecified: Secondary | ICD-10-CM | POA: Insufficient documentation

## 2013-04-05 DIAGNOSIS — E119 Type 2 diabetes mellitus without complications: Secondary | ICD-10-CM | POA: Insufficient documentation

## 2013-04-05 DIAGNOSIS — Z01812 Encounter for preprocedural laboratory examination: Secondary | ICD-10-CM | POA: Insufficient documentation

## 2013-04-05 HISTORY — DX: Type 2 diabetes mellitus without complications: E11.9

## 2013-04-05 HISTORY — DX: Pure hypercholesterolemia, unspecified: E78.00

## 2013-04-05 HISTORY — PX: COLONOSCOPY: SHX5424

## 2013-04-05 HISTORY — DX: Essential (primary) hypertension: I10

## 2013-04-05 HISTORY — DX: Chronic obstructive pulmonary disease, unspecified: J44.9

## 2013-04-05 LAB — GLUCOSE, CAPILLARY
Glucose-Capillary: 160 mg/dL — ABNORMAL HIGH (ref 70–99)
Glucose-Capillary: 225 mg/dL — ABNORMAL HIGH (ref 70–99)
Glucose-Capillary: 227 mg/dL — ABNORMAL HIGH (ref 70–99)
Glucose-Capillary: 229 mg/dL — ABNORMAL HIGH (ref 70–99)

## 2013-04-05 SURGERY — COLONOSCOPY
Anesthesia: Moderate Sedation

## 2013-04-05 MED ORDER — MEPERIDINE HCL 100 MG/ML IJ SOLN
INTRAMUSCULAR | Status: DC | PRN
  Administered 2013-04-05 (×2): 25 mg via INTRAVENOUS

## 2013-04-05 MED ORDER — SODIUM CHLORIDE 0.9 % IV SOLN
INTRAVENOUS | Status: DC
  Administered 2013-04-05: 08:00:00 via INTRAVENOUS

## 2013-04-05 MED ORDER — MIDAZOLAM HCL 5 MG/5ML IJ SOLN
INTRAMUSCULAR | Status: DC | PRN
  Administered 2013-04-05 (×2): 2 mg via INTRAVENOUS

## 2013-04-05 MED ORDER — MIDAZOLAM HCL 5 MG/5ML IJ SOLN
INTRAMUSCULAR | Status: AC
  Filled 2013-04-05: qty 10

## 2013-04-05 MED ORDER — MEPERIDINE HCL 100 MG/ML IJ SOLN
INTRAMUSCULAR | Status: AC
  Filled 2013-04-05: qty 2

## 2013-04-05 MED ORDER — SODIUM CHLORIDE 0.9 % IV SOLN: INTRAVENOUS | Status: DC

## 2013-04-05 NOTE — Op Note (Signed)
[facility] [address] [place], [address]   COLONOSCOPY PROCEDURE REPORT  PATIENT: Douglas [name], Douglas [name]  MR#: [id number] BIRTHDATE: December 25, 1950 , 62  yrs. old GENDER: Male ENDOSCOPIST: Jonette Eva, MD REFERRED NW:GNFAOZH Regino Schultze, M.D. PROCEDURE DATE:  04/05/2013 PROCEDURE:   Colonoscopy with snare polypectomy INDICATIONS:Average risk patient for colon cancer. MEDICATIONS: Demerol 50 mg IV and Versed 4 mg IV  DESCRIPTION OF PROCEDURE:    Physical exam was performed.  Informed consent was obtained from the patient after explaining the benefits, risks, and alternatives to procedure.  The patient was connected to monitor and placed in left lateral position. Continuous oxygen was provided by nasal cannula and IV medicine administered through an indwelling cannula.  After administration of sedation and rectal exam, the patients rectum was intubated and the EC-[id number] ([id number])  colonoscope was advanced under direct visualization to the ileum.  The scope was removed slowly by carefully examining the color, texture, anatomy, and integrity mucosa on the way out.  The patient was recovered in endoscopy and discharged home in satisfactory condition.    COLON FINDINGS: The mucosa appeared normal in the terminal ileum.  , Three sessile polyps measuring 6-12 mm in size were found in the distal transverse colon, ascending colon, and rectum.  A polypectomy was performed using snare cautery.  , and Moderate sized internal hemorrhoids were found.  PREP QUALITY: good.  CECAL W/D TIME: 12 minutes     COMPLICATIONS: None  ENDOSCOPIC IMPRESSION: 1.   Three COLON polyps REMOVED 2.   Moderate sized internal hemorrhoids  RECOMMENDATIONS: FOLLOW A HIGH FIBER DIET.  AVOID ITEMS THAT CAUSE BLOATING & GAS. BIOPSY RESULTS SHOULD BE BACK IN 7 DAYS. Next colonoscopy in 1 YEAR IF ADVANCED POLYPS AND 3 years IF SIMPLE ADENOMAS. ALL SISTERS, BROTHERS, CHILDREN, AND PARENTS NEED TO HAVE  A COLONOSCOPY STARTING AT THE AGE OF 40.       _______________________________ Rosalie DoctorJonette Eva, MD 04/05/2013 9:35 AM

## 2013-04-05 NOTE — OR Nursing (Signed)
Dr. Darrick Penna notified of patient having an insulin pump and disconnecting it. Blood sugar is 160. Will check blood sugar in 15 minutes and give IV insulin if necessary per Dr. Darrick Penna.

## 2013-04-05 NOTE — H&P (Signed)
  Primary Care Physician:  Kirk Ruths, MD Primary Gastroenterologist:  Dr. Darrick Penna  Pre-Procedure History & Physical: HPI:  Douglas Correa. is a 62 y.o. male here for COLON CANCER SCREENING.  Past Medical History  Diagnosis Date  . Diabetes mellitus without complication   . Hypertension   . Hypercholesteremia   . COPD (chronic obstructive pulmonary disease)     Past Surgical History  Procedure Laterality Date  . No past surgeries      Prior to Admission medications   Medication Sig Start Date End Date Taking? Authorizing Provider  atorvastatin (LIPITOR) 40 MG tablet Take 40 mg by mouth daily.   Yes Historical Provider, MD  brimonidine (ALPHAGAN) 0.15 % ophthalmic solution Place 1 drop into the left eye 2 (two) times daily.   Yes Historical Provider, MD  enalapril (VASOTEC) 10 MG tablet Take 10 mg by mouth daily.   Yes Historical Provider, MD  FLUoxetine (PROZAC) 20 MG capsule Take 60 mg by mouth daily.    Yes Historical Provider, MD  hydrochlorothiazide (HYDRODIURIL) 25 MG tablet Take 25 mg by mouth daily.   Yes Historical Provider, MD  Insulin Human (INSULIN PUMP) 100 unit/ml SOLN Inject into the skin. Humalog Insulin Pump.  Approx. 60 units a day.   Yes Historical Provider, MD  peg 3350 powder (MOVIPREP) 100 G SOLR Take 1 kit (200 g total) by mouth as directed. 03/19/13  Yes [place] Bali, MD    Allergies as of 03/13/2013  . (Not on File)    Family History  Problem Relation Age of Onset  . Colon cancer Neg Hx     History   Social History  . Marital Status: Married    Spouse Name: N/A    Number of Children: N/A  . Years of Education: N/A   Occupational History  . Not on file.   Social History Main Topics  . Smoking status: Former Smoker -- 2.00 packs/day for 35 years    Types: Cigarettes  . Smokeless tobacco: Not on file  . Alcohol Use: No  . Drug Use: No  . Sexual Activity: Not on file   Other Topics Concern  . Not on file   Social History  Narrative  . No narrative on file    Review of Systems: See HPI, otherwise negative ROS   Physical Exam: BP 140/76  Pulse 62  Temp(Src) 98.6 F (37 C) (Oral)  Resp 14  Ht 5\' 9"  (1.753 m)  Wt 180 lb (81.647 kg)  BMI 26.57 kg/m2  SpO2 98% General:   Alert,  pleasant and cooperative in NAD Head:  Normocephalic and atraumatic. Neck:  Supple; Lungs:  Clear throughout to auscultation.    Heart:  Regular rate and rhythm. Abdomen:  Soft, nontender and nondistended. Normal bowel sounds, without guarding, and without rebound.   Neurologic:  Alert and  oriented x4;  grossly normal neurologically.  Impression/Plan:     SCREENING  Plan:  1. TCS TODAY

## 2013-04-09 ENCOUNTER — Telehealth: Payer: Self-pay | Admitting: Gastroenterology

## 2013-04-09 ENCOUNTER — Encounter (HOSPITAL_COMMUNITY): Payer: Self-pay | Admitting: Gastroenterology

## 2013-04-09 NOTE — Telephone Encounter (Signed)
Please call pt. HE had simple adenomas removed from HIS colon.    FOLLOW A HIGH FIBER DIET. AVOID ITEMS THAT CAUSE BLOATING & GAS.  Next colonoscopy in 3 years. YOUR SISTERS, BROTHERS, CHILDREN, AND PARENTS NEED TO HAVE A COLONOSCOPY STARTING AT THE AGE OF 40.

## 2013-04-09 NOTE — Telephone Encounter (Signed)
Pt returned call and was informed.  

## 2013-04-09 NOTE — Telephone Encounter (Signed)
LMOM to call.

## 2013-05-13 ENCOUNTER — Encounter (INDEPENDENT_AMBULATORY_CARE_PROVIDER_SITE_OTHER): Payer: Managed Care, Other (non HMO) | Admitting: Ophthalmology

## 2013-05-13 DIAGNOSIS — H2510 Age-related nuclear cataract, unspecified eye: Secondary | ICD-10-CM

## 2013-05-13 DIAGNOSIS — E11319 Type 2 diabetes mellitus with unspecified diabetic retinopathy without macular edema: Secondary | ICD-10-CM

## 2013-05-13 DIAGNOSIS — E1139 Type 2 diabetes mellitus with other diabetic ophthalmic complication: Secondary | ICD-10-CM

## 2013-05-13 DIAGNOSIS — E11359 Type 2 diabetes mellitus with proliferative diabetic retinopathy without macular edema: Secondary | ICD-10-CM

## 2013-05-13 DIAGNOSIS — E11311 Type 2 diabetes mellitus with unspecified diabetic retinopathy with macular edema: Secondary | ICD-10-CM

## 2013-05-13 DIAGNOSIS — H43819 Vitreous degeneration, unspecified eye: Secondary | ICD-10-CM

## 2013-05-13 DIAGNOSIS — E1165 Type 2 diabetes mellitus with hyperglycemia: Secondary | ICD-10-CM

## 2013-05-13 DIAGNOSIS — I10 Essential (primary) hypertension: Secondary | ICD-10-CM

## 2013-05-13 DIAGNOSIS — H35039 Hypertensive retinopathy, unspecified eye: Secondary | ICD-10-CM

## 2013-05-28 ENCOUNTER — Ambulatory Visit (INDEPENDENT_AMBULATORY_CARE_PROVIDER_SITE_OTHER): Payer: Managed Care, Other (non HMO) | Admitting: Ophthalmology

## 2013-05-28 DIAGNOSIS — E1165 Type 2 diabetes mellitus with hyperglycemia: Secondary | ICD-10-CM

## 2013-05-28 DIAGNOSIS — E1139 Type 2 diabetes mellitus with other diabetic ophthalmic complication: Secondary | ICD-10-CM

## 2013-05-28 DIAGNOSIS — E11359 Type 2 diabetes mellitus with proliferative diabetic retinopathy without macular edema: Secondary | ICD-10-CM

## 2013-07-09 ENCOUNTER — Encounter (INDEPENDENT_AMBULATORY_CARE_PROVIDER_SITE_OTHER): Payer: Managed Care, Other (non HMO) | Admitting: Ophthalmology

## 2013-07-09 DIAGNOSIS — E1065 Type 1 diabetes mellitus with hyperglycemia: Secondary | ICD-10-CM

## 2013-07-09 DIAGNOSIS — E11311 Type 2 diabetes mellitus with unspecified diabetic retinopathy with macular edema: Secondary | ICD-10-CM

## 2013-07-09 DIAGNOSIS — E11359 Type 2 diabetes mellitus with proliferative diabetic retinopathy without macular edema: Secondary | ICD-10-CM

## 2013-07-09 DIAGNOSIS — E1039 Type 1 diabetes mellitus with other diabetic ophthalmic complication: Secondary | ICD-10-CM

## 2013-08-27 ENCOUNTER — Encounter (INDEPENDENT_AMBULATORY_CARE_PROVIDER_SITE_OTHER): Payer: Managed Care, Other (non HMO) | Admitting: Ophthalmology

## 2013-08-27 DIAGNOSIS — I10 Essential (primary) hypertension: Secondary | ICD-10-CM

## 2013-08-27 DIAGNOSIS — H2510 Age-related nuclear cataract, unspecified eye: Secondary | ICD-10-CM

## 2013-08-27 DIAGNOSIS — E11311 Type 2 diabetes mellitus with unspecified diabetic retinopathy with macular edema: Secondary | ICD-10-CM

## 2013-08-27 DIAGNOSIS — H43819 Vitreous degeneration, unspecified eye: Secondary | ICD-10-CM

## 2013-08-27 DIAGNOSIS — E1039 Type 1 diabetes mellitus with other diabetic ophthalmic complication: Secondary | ICD-10-CM

## 2013-08-27 DIAGNOSIS — E1065 Type 1 diabetes mellitus with hyperglycemia: Secondary | ICD-10-CM

## 2013-08-27 DIAGNOSIS — H35039 Hypertensive retinopathy, unspecified eye: Secondary | ICD-10-CM

## 2013-08-27 DIAGNOSIS — E11359 Type 2 diabetes mellitus with proliferative diabetic retinopathy without macular edema: Secondary | ICD-10-CM

## 2013-10-08 ENCOUNTER — Encounter (INDEPENDENT_AMBULATORY_CARE_PROVIDER_SITE_OTHER): Payer: Managed Care, Other (non HMO) | Admitting: Ophthalmology

## 2013-10-08 DIAGNOSIS — H35039 Hypertensive retinopathy, unspecified eye: Secondary | ICD-10-CM

## 2013-10-08 DIAGNOSIS — E11359 Type 2 diabetes mellitus with proliferative diabetic retinopathy without macular edema: Secondary | ICD-10-CM

## 2013-10-08 DIAGNOSIS — E1065 Type 1 diabetes mellitus with hyperglycemia: Secondary | ICD-10-CM

## 2013-10-08 DIAGNOSIS — I10 Essential (primary) hypertension: Secondary | ICD-10-CM

## 2013-10-08 DIAGNOSIS — E1039 Type 1 diabetes mellitus with other diabetic ophthalmic complication: Secondary | ICD-10-CM

## 2013-10-08 DIAGNOSIS — E11311 Type 2 diabetes mellitus with unspecified diabetic retinopathy with macular edema: Secondary | ICD-10-CM

## 2013-10-08 DIAGNOSIS — H43819 Vitreous degeneration, unspecified eye: Secondary | ICD-10-CM

## 2013-11-19 ENCOUNTER — Encounter (INDEPENDENT_AMBULATORY_CARE_PROVIDER_SITE_OTHER): Payer: Managed Care, Other (non HMO) | Admitting: Ophthalmology

## 2013-11-26 ENCOUNTER — Encounter (INDEPENDENT_AMBULATORY_CARE_PROVIDER_SITE_OTHER): Payer: Managed Care, Other (non HMO) | Admitting: Ophthalmology

## 2013-11-26 DIAGNOSIS — H35039 Hypertensive retinopathy, unspecified eye: Secondary | ICD-10-CM

## 2013-11-26 DIAGNOSIS — E1039 Type 1 diabetes mellitus with other diabetic ophthalmic complication: Secondary | ICD-10-CM

## 2013-11-26 DIAGNOSIS — E1065 Type 1 diabetes mellitus with hyperglycemia: Secondary | ICD-10-CM

## 2013-11-26 DIAGNOSIS — H43819 Vitreous degeneration, unspecified eye: Secondary | ICD-10-CM

## 2013-11-26 DIAGNOSIS — E11311 Type 2 diabetes mellitus with unspecified diabetic retinopathy with macular edema: Secondary | ICD-10-CM

## 2013-11-26 DIAGNOSIS — I10 Essential (primary) hypertension: Secondary | ICD-10-CM

## 2013-11-26 DIAGNOSIS — E11359 Type 2 diabetes mellitus with proliferative diabetic retinopathy without macular edema: Secondary | ICD-10-CM

## 2013-11-26 DIAGNOSIS — H2510 Age-related nuclear cataract, unspecified eye: Secondary | ICD-10-CM

## 2014-01-07 ENCOUNTER — Encounter (INDEPENDENT_AMBULATORY_CARE_PROVIDER_SITE_OTHER): Payer: Managed Care, Other (non HMO) | Admitting: Ophthalmology

## 2014-01-07 DIAGNOSIS — E11359 Type 2 diabetes mellitus with proliferative diabetic retinopathy without macular edema: Secondary | ICD-10-CM

## 2014-01-07 DIAGNOSIS — E1065 Type 1 diabetes mellitus with hyperglycemia: Secondary | ICD-10-CM

## 2014-01-07 DIAGNOSIS — H43819 Vitreous degeneration, unspecified eye: Secondary | ICD-10-CM

## 2014-01-07 DIAGNOSIS — E11311 Type 2 diabetes mellitus with unspecified diabetic retinopathy with macular edema: Secondary | ICD-10-CM

## 2014-01-07 DIAGNOSIS — H35039 Hypertensive retinopathy, unspecified eye: Secondary | ICD-10-CM

## 2014-01-07 DIAGNOSIS — E1039 Type 1 diabetes mellitus with other diabetic ophthalmic complication: Secondary | ICD-10-CM

## 2014-01-07 DIAGNOSIS — I10 Essential (primary) hypertension: Secondary | ICD-10-CM

## 2014-02-18 ENCOUNTER — Encounter (INDEPENDENT_AMBULATORY_CARE_PROVIDER_SITE_OTHER): Payer: Managed Care, Other (non HMO) | Admitting: Ophthalmology

## 2014-02-18 DIAGNOSIS — E10311 Type 1 diabetes mellitus with unspecified diabetic retinopathy with macular edema: Secondary | ICD-10-CM

## 2014-02-18 DIAGNOSIS — E10351 Type 1 diabetes mellitus with proliferative diabetic retinopathy with macular edema: Secondary | ICD-10-CM

## 2014-02-18 DIAGNOSIS — H43813 Vitreous degeneration, bilateral: Secondary | ICD-10-CM

## 2014-02-18 DIAGNOSIS — H35033 Hypertensive retinopathy, bilateral: Secondary | ICD-10-CM

## 2014-02-18 DIAGNOSIS — I10 Essential (primary) hypertension: Secondary | ICD-10-CM

## 2014-02-26 ENCOUNTER — Encounter: Payer: Self-pay | Admitting: Gastroenterology

## 2014-04-01 ENCOUNTER — Encounter (INDEPENDENT_AMBULATORY_CARE_PROVIDER_SITE_OTHER): Payer: Managed Care, Other (non HMO) | Admitting: Ophthalmology

## 2014-04-01 DIAGNOSIS — E10351 Type 1 diabetes mellitus with proliferative diabetic retinopathy with macular edema: Secondary | ICD-10-CM

## 2014-04-01 DIAGNOSIS — H43813 Vitreous degeneration, bilateral: Secondary | ICD-10-CM

## 2014-04-01 DIAGNOSIS — I10 Essential (primary) hypertension: Secondary | ICD-10-CM

## 2014-04-01 DIAGNOSIS — E10311 Type 1 diabetes mellitus with unspecified diabetic retinopathy with macular edema: Secondary | ICD-10-CM

## 2014-04-01 DIAGNOSIS — H35033 Hypertensive retinopathy, bilateral: Secondary | ICD-10-CM

## 2014-05-13 ENCOUNTER — Encounter (INDEPENDENT_AMBULATORY_CARE_PROVIDER_SITE_OTHER): Payer: Managed Care, Other (non HMO) | Admitting: Ophthalmology

## 2014-05-13 DIAGNOSIS — E10351 Type 1 diabetes mellitus with proliferative diabetic retinopathy with macular edema: Secondary | ICD-10-CM

## 2014-05-13 DIAGNOSIS — H43813 Vitreous degeneration, bilateral: Secondary | ICD-10-CM

## 2014-05-13 DIAGNOSIS — E10311 Type 1 diabetes mellitus with unspecified diabetic retinopathy with macular edema: Secondary | ICD-10-CM

## 2014-05-13 DIAGNOSIS — H35033 Hypertensive retinopathy, bilateral: Secondary | ICD-10-CM

## 2014-05-13 DIAGNOSIS — I10 Essential (primary) hypertension: Secondary | ICD-10-CM

## 2014-06-24 ENCOUNTER — Encounter (INDEPENDENT_AMBULATORY_CARE_PROVIDER_SITE_OTHER): Payer: Managed Care, Other (non HMO) | Admitting: Ophthalmology

## 2014-07-02 ENCOUNTER — Encounter (INDEPENDENT_AMBULATORY_CARE_PROVIDER_SITE_OTHER): Payer: Managed Care, Other (non HMO) | Admitting: Ophthalmology

## 2014-08-26 ENCOUNTER — Encounter (INDEPENDENT_AMBULATORY_CARE_PROVIDER_SITE_OTHER): Payer: Medicare Other | Admitting: Ophthalmology

## 2014-08-26 DIAGNOSIS — H35033 Hypertensive retinopathy, bilateral: Secondary | ICD-10-CM

## 2014-08-26 DIAGNOSIS — I10 Essential (primary) hypertension: Secondary | ICD-10-CM

## 2014-08-26 DIAGNOSIS — H43813 Vitreous degeneration, bilateral: Secondary | ICD-10-CM

## 2014-08-26 DIAGNOSIS — E10351 Type 1 diabetes mellitus with proliferative diabetic retinopathy with macular edema: Secondary | ICD-10-CM

## 2014-08-26 DIAGNOSIS — E10311 Type 1 diabetes mellitus with unspecified diabetic retinopathy with macular edema: Secondary | ICD-10-CM | POA: Diagnosis not present

## 2014-10-07 ENCOUNTER — Encounter (INDEPENDENT_AMBULATORY_CARE_PROVIDER_SITE_OTHER): Payer: Medicare Other | Admitting: Ophthalmology

## 2014-10-07 DIAGNOSIS — E10351 Type 1 diabetes mellitus with proliferative diabetic retinopathy with macular edema: Secondary | ICD-10-CM | POA: Diagnosis not present

## 2014-10-07 DIAGNOSIS — I10 Essential (primary) hypertension: Secondary | ICD-10-CM | POA: Diagnosis not present

## 2014-10-07 DIAGNOSIS — E10311 Type 1 diabetes mellitus with unspecified diabetic retinopathy with macular edema: Secondary | ICD-10-CM | POA: Diagnosis not present

## 2014-10-07 DIAGNOSIS — H35033 Hypertensive retinopathy, bilateral: Secondary | ICD-10-CM | POA: Diagnosis not present

## 2014-10-07 DIAGNOSIS — H43813 Vitreous degeneration, bilateral: Secondary | ICD-10-CM | POA: Diagnosis not present

## 2014-11-25 ENCOUNTER — Encounter (INDEPENDENT_AMBULATORY_CARE_PROVIDER_SITE_OTHER): Payer: Medicare Other | Admitting: Ophthalmology

## 2014-11-25 DIAGNOSIS — I10 Essential (primary) hypertension: Secondary | ICD-10-CM

## 2014-11-25 DIAGNOSIS — E10351 Type 1 diabetes mellitus with proliferative diabetic retinopathy with macular edema: Secondary | ICD-10-CM | POA: Diagnosis not present

## 2014-11-25 DIAGNOSIS — H43813 Vitreous degeneration, bilateral: Secondary | ICD-10-CM

## 2014-11-25 DIAGNOSIS — H35033 Hypertensive retinopathy, bilateral: Secondary | ICD-10-CM

## 2014-11-25 DIAGNOSIS — E10311 Type 1 diabetes mellitus with unspecified diabetic retinopathy with macular edema: Secondary | ICD-10-CM

## 2015-01-06 ENCOUNTER — Encounter (INDEPENDENT_AMBULATORY_CARE_PROVIDER_SITE_OTHER): Payer: Medicare Other | Admitting: Ophthalmology

## 2015-01-06 DIAGNOSIS — I10 Essential (primary) hypertension: Secondary | ICD-10-CM

## 2015-01-06 DIAGNOSIS — H35033 Hypertensive retinopathy, bilateral: Secondary | ICD-10-CM

## 2015-01-06 DIAGNOSIS — H43813 Vitreous degeneration, bilateral: Secondary | ICD-10-CM

## 2015-01-06 DIAGNOSIS — E10351 Type 1 diabetes mellitus with proliferative diabetic retinopathy with macular edema: Secondary | ICD-10-CM

## 2015-01-06 DIAGNOSIS — E10311 Type 1 diabetes mellitus with unspecified diabetic retinopathy with macular edema: Secondary | ICD-10-CM | POA: Diagnosis not present

## 2015-01-19 ENCOUNTER — Encounter (INDEPENDENT_AMBULATORY_CARE_PROVIDER_SITE_OTHER): Payer: Medicare Other | Admitting: Ophthalmology

## 2015-01-19 DIAGNOSIS — E103513 Type 1 diabetes mellitus with proliferative diabetic retinopathy with macular edema, bilateral: Secondary | ICD-10-CM | POA: Diagnosis not present

## 2015-01-19 DIAGNOSIS — E10311 Type 1 diabetes mellitus with unspecified diabetic retinopathy with macular edema: Secondary | ICD-10-CM | POA: Diagnosis not present

## 2015-03-03 ENCOUNTER — Encounter (INDEPENDENT_AMBULATORY_CARE_PROVIDER_SITE_OTHER): Payer: Medicare Other | Admitting: Ophthalmology

## 2015-03-03 DIAGNOSIS — E103513 Type 1 diabetes mellitus with proliferative diabetic retinopathy with macular edema, bilateral: Secondary | ICD-10-CM | POA: Diagnosis not present

## 2015-03-03 DIAGNOSIS — H35033 Hypertensive retinopathy, bilateral: Secondary | ICD-10-CM

## 2015-03-03 DIAGNOSIS — H43813 Vitreous degeneration, bilateral: Secondary | ICD-10-CM | POA: Diagnosis not present

## 2015-03-03 DIAGNOSIS — E10311 Type 1 diabetes mellitus with unspecified diabetic retinopathy with macular edema: Secondary | ICD-10-CM | POA: Diagnosis not present

## 2015-03-03 DIAGNOSIS — I10 Essential (primary) hypertension: Secondary | ICD-10-CM

## 2015-04-27 ENCOUNTER — Encounter (INDEPENDENT_AMBULATORY_CARE_PROVIDER_SITE_OTHER): Payer: Medicare Other | Admitting: Ophthalmology

## 2015-05-06 ENCOUNTER — Encounter (INDEPENDENT_AMBULATORY_CARE_PROVIDER_SITE_OTHER): Payer: Medicare Other | Admitting: Ophthalmology

## 2015-05-06 DIAGNOSIS — H35033 Hypertensive retinopathy, bilateral: Secondary | ICD-10-CM | POA: Diagnosis not present

## 2015-05-06 DIAGNOSIS — E11311 Type 2 diabetes mellitus with unspecified diabetic retinopathy with macular edema: Secondary | ICD-10-CM | POA: Diagnosis not present

## 2015-05-06 DIAGNOSIS — I10 Essential (primary) hypertension: Secondary | ICD-10-CM

## 2015-05-06 DIAGNOSIS — H43813 Vitreous degeneration, bilateral: Secondary | ICD-10-CM | POA: Diagnosis not present

## 2015-05-06 DIAGNOSIS — E113513 Type 2 diabetes mellitus with proliferative diabetic retinopathy with macular edema, bilateral: Secondary | ICD-10-CM

## 2015-06-17 ENCOUNTER — Encounter (INDEPENDENT_AMBULATORY_CARE_PROVIDER_SITE_OTHER): Payer: Medicare Other | Admitting: Ophthalmology

## 2015-07-02 ENCOUNTER — Encounter (INDEPENDENT_AMBULATORY_CARE_PROVIDER_SITE_OTHER): Payer: Medicare Other | Admitting: Ophthalmology

## 2015-07-02 DIAGNOSIS — H35033 Hypertensive retinopathy, bilateral: Secondary | ICD-10-CM | POA: Diagnosis not present

## 2015-07-02 DIAGNOSIS — E103513 Type 1 diabetes mellitus with proliferative diabetic retinopathy with macular edema, bilateral: Secondary | ICD-10-CM | POA: Diagnosis not present

## 2015-07-02 DIAGNOSIS — H26493 Other secondary cataract, bilateral: Secondary | ICD-10-CM | POA: Diagnosis not present

## 2015-07-02 DIAGNOSIS — H43813 Vitreous degeneration, bilateral: Secondary | ICD-10-CM

## 2015-07-02 DIAGNOSIS — I10 Essential (primary) hypertension: Secondary | ICD-10-CM

## 2015-07-02 DIAGNOSIS — E10311 Type 1 diabetes mellitus with unspecified diabetic retinopathy with macular edema: Secondary | ICD-10-CM

## 2015-08-13 ENCOUNTER — Encounter (INDEPENDENT_AMBULATORY_CARE_PROVIDER_SITE_OTHER): Payer: Medicare Other | Admitting: Ophthalmology

## 2015-08-13 DIAGNOSIS — E10311 Type 1 diabetes mellitus with unspecified diabetic retinopathy with macular edema: Secondary | ICD-10-CM | POA: Diagnosis not present

## 2015-08-13 DIAGNOSIS — H35033 Hypertensive retinopathy, bilateral: Secondary | ICD-10-CM | POA: Diagnosis not present

## 2015-08-13 DIAGNOSIS — I10 Essential (primary) hypertension: Secondary | ICD-10-CM

## 2015-08-13 DIAGNOSIS — H43813 Vitreous degeneration, bilateral: Secondary | ICD-10-CM | POA: Diagnosis not present

## 2015-08-13 DIAGNOSIS — E103513 Type 1 diabetes mellitus with proliferative diabetic retinopathy with macular edema, bilateral: Secondary | ICD-10-CM | POA: Diagnosis not present

## 2015-09-24 ENCOUNTER — Encounter (INDEPENDENT_AMBULATORY_CARE_PROVIDER_SITE_OTHER): Payer: Medicare Other | Admitting: Ophthalmology

## 2015-09-24 DIAGNOSIS — I10 Essential (primary) hypertension: Secondary | ICD-10-CM

## 2015-09-24 DIAGNOSIS — E10311 Type 1 diabetes mellitus with unspecified diabetic retinopathy with macular edema: Secondary | ICD-10-CM | POA: Diagnosis not present

## 2015-09-24 DIAGNOSIS — H35033 Hypertensive retinopathy, bilateral: Secondary | ICD-10-CM

## 2015-09-24 DIAGNOSIS — E103513 Type 1 diabetes mellitus with proliferative diabetic retinopathy with macular edema, bilateral: Secondary | ICD-10-CM | POA: Diagnosis not present

## 2015-09-24 DIAGNOSIS — H43813 Vitreous degeneration, bilateral: Secondary | ICD-10-CM

## 2015-11-05 ENCOUNTER — Encounter (INDEPENDENT_AMBULATORY_CARE_PROVIDER_SITE_OTHER): Payer: Medicare Other | Admitting: Ophthalmology

## 2015-11-05 DIAGNOSIS — H43813 Vitreous degeneration, bilateral: Secondary | ICD-10-CM

## 2015-11-05 DIAGNOSIS — H35033 Hypertensive retinopathy, bilateral: Secondary | ICD-10-CM | POA: Diagnosis not present

## 2015-11-05 DIAGNOSIS — E11311 Type 2 diabetes mellitus with unspecified diabetic retinopathy with macular edema: Secondary | ICD-10-CM

## 2015-11-05 DIAGNOSIS — I10 Essential (primary) hypertension: Secondary | ICD-10-CM | POA: Diagnosis not present

## 2015-11-05 DIAGNOSIS — E113513 Type 2 diabetes mellitus with proliferative diabetic retinopathy with macular edema, bilateral: Secondary | ICD-10-CM

## 2015-12-17 ENCOUNTER — Encounter (INDEPENDENT_AMBULATORY_CARE_PROVIDER_SITE_OTHER): Payer: Medicare Other | Admitting: Ophthalmology

## 2015-12-17 DIAGNOSIS — H35033 Hypertensive retinopathy, bilateral: Secondary | ICD-10-CM

## 2015-12-17 DIAGNOSIS — H43813 Vitreous degeneration, bilateral: Secondary | ICD-10-CM | POA: Diagnosis not present

## 2015-12-17 DIAGNOSIS — I10 Essential (primary) hypertension: Secondary | ICD-10-CM | POA: Diagnosis not present

## 2015-12-17 DIAGNOSIS — E10311 Type 1 diabetes mellitus with unspecified diabetic retinopathy with macular edema: Secondary | ICD-10-CM

## 2015-12-17 DIAGNOSIS — E103513 Type 1 diabetes mellitus with proliferative diabetic retinopathy with macular edema, bilateral: Secondary | ICD-10-CM

## 2016-01-28 ENCOUNTER — Encounter (INDEPENDENT_AMBULATORY_CARE_PROVIDER_SITE_OTHER): Payer: Medicare Other | Admitting: Ophthalmology

## 2016-01-28 DIAGNOSIS — H43813 Vitreous degeneration, bilateral: Secondary | ICD-10-CM | POA: Diagnosis not present

## 2016-01-28 DIAGNOSIS — E103513 Type 1 diabetes mellitus with proliferative diabetic retinopathy with macular edema, bilateral: Secondary | ICD-10-CM | POA: Diagnosis not present

## 2016-01-28 DIAGNOSIS — I10 Essential (primary) hypertension: Secondary | ICD-10-CM | POA: Diagnosis not present

## 2016-01-28 DIAGNOSIS — E10311 Type 1 diabetes mellitus with unspecified diabetic retinopathy with macular edema: Secondary | ICD-10-CM

## 2016-01-28 DIAGNOSIS — H35033 Hypertensive retinopathy, bilateral: Secondary | ICD-10-CM | POA: Diagnosis not present

## 2016-03-14 ENCOUNTER — Encounter (INDEPENDENT_AMBULATORY_CARE_PROVIDER_SITE_OTHER): Payer: Medicare Other | Admitting: Ophthalmology

## 2016-04-06 ENCOUNTER — Encounter (INDEPENDENT_AMBULATORY_CARE_PROVIDER_SITE_OTHER): Payer: Medicare Other | Admitting: Ophthalmology

## 2016-04-06 DIAGNOSIS — H35033 Hypertensive retinopathy, bilateral: Secondary | ICD-10-CM

## 2016-04-06 DIAGNOSIS — I10 Essential (primary) hypertension: Secondary | ICD-10-CM

## 2016-04-06 DIAGNOSIS — E10311 Type 1 diabetes mellitus with unspecified diabetic retinopathy with macular edema: Secondary | ICD-10-CM

## 2016-04-06 DIAGNOSIS — E103513 Type 1 diabetes mellitus with proliferative diabetic retinopathy with macular edema, bilateral: Secondary | ICD-10-CM

## 2016-04-06 DIAGNOSIS — H43813 Vitreous degeneration, bilateral: Secondary | ICD-10-CM | POA: Diagnosis not present

## 2016-05-18 ENCOUNTER — Encounter (INDEPENDENT_AMBULATORY_CARE_PROVIDER_SITE_OTHER): Payer: Medicare Other | Admitting: Ophthalmology

## 2016-05-18 DIAGNOSIS — H43813 Vitreous degeneration, bilateral: Secondary | ICD-10-CM

## 2016-05-18 DIAGNOSIS — H35373 Puckering of macula, bilateral: Secondary | ICD-10-CM

## 2016-05-18 DIAGNOSIS — I10 Essential (primary) hypertension: Secondary | ICD-10-CM

## 2016-05-18 DIAGNOSIS — E103513 Type 1 diabetes mellitus with proliferative diabetic retinopathy with macular edema, bilateral: Secondary | ICD-10-CM | POA: Diagnosis not present

## 2016-05-18 DIAGNOSIS — H35033 Hypertensive retinopathy, bilateral: Secondary | ICD-10-CM

## 2016-05-18 DIAGNOSIS — E10311 Type 1 diabetes mellitus with unspecified diabetic retinopathy with macular edema: Secondary | ICD-10-CM

## 2016-06-29 ENCOUNTER — Encounter (INDEPENDENT_AMBULATORY_CARE_PROVIDER_SITE_OTHER): Payer: Medicare Other | Admitting: Ophthalmology

## 2016-06-29 DIAGNOSIS — E103513 Type 1 diabetes mellitus with proliferative diabetic retinopathy with macular edema, bilateral: Secondary | ICD-10-CM

## 2016-06-29 DIAGNOSIS — E10311 Type 1 diabetes mellitus with unspecified diabetic retinopathy with macular edema: Secondary | ICD-10-CM | POA: Diagnosis not present

## 2016-06-29 DIAGNOSIS — H43813 Vitreous degeneration, bilateral: Secondary | ICD-10-CM | POA: Diagnosis not present

## 2016-06-29 DIAGNOSIS — I10 Essential (primary) hypertension: Secondary | ICD-10-CM

## 2016-06-29 DIAGNOSIS — H35033 Hypertensive retinopathy, bilateral: Secondary | ICD-10-CM

## 2016-08-10 ENCOUNTER — Encounter (INDEPENDENT_AMBULATORY_CARE_PROVIDER_SITE_OTHER): Payer: Medicare Other | Admitting: Ophthalmology

## 2016-08-17 ENCOUNTER — Encounter (INDEPENDENT_AMBULATORY_CARE_PROVIDER_SITE_OTHER): Payer: Medicare Other | Admitting: Ophthalmology

## 2016-08-17 DIAGNOSIS — E103513 Type 1 diabetes mellitus with proliferative diabetic retinopathy with macular edema, bilateral: Secondary | ICD-10-CM

## 2016-08-17 DIAGNOSIS — I10 Essential (primary) hypertension: Secondary | ICD-10-CM

## 2016-08-17 DIAGNOSIS — H35033 Hypertensive retinopathy, bilateral: Secondary | ICD-10-CM

## 2016-08-17 DIAGNOSIS — H35372 Puckering of macula, left eye: Secondary | ICD-10-CM

## 2016-08-17 DIAGNOSIS — E10311 Type 1 diabetes mellitus with unspecified diabetic retinopathy with macular edema: Secondary | ICD-10-CM | POA: Diagnosis not present

## 2016-08-17 DIAGNOSIS — H43813 Vitreous degeneration, bilateral: Secondary | ICD-10-CM

## 2016-09-28 ENCOUNTER — Encounter (INDEPENDENT_AMBULATORY_CARE_PROVIDER_SITE_OTHER): Payer: Medicare Other | Admitting: Ophthalmology

## 2016-09-28 DIAGNOSIS — H43813 Vitreous degeneration, bilateral: Secondary | ICD-10-CM

## 2016-09-28 DIAGNOSIS — E10311 Type 1 diabetes mellitus with unspecified diabetic retinopathy with macular edema: Secondary | ICD-10-CM

## 2016-09-28 DIAGNOSIS — I10 Essential (primary) hypertension: Secondary | ICD-10-CM

## 2016-09-28 DIAGNOSIS — E103513 Type 1 diabetes mellitus with proliferative diabetic retinopathy with macular edema, bilateral: Secondary | ICD-10-CM | POA: Diagnosis not present

## 2016-09-28 DIAGNOSIS — H35033 Hypertensive retinopathy, bilateral: Secondary | ICD-10-CM

## 2016-09-29 ENCOUNTER — Encounter (INDEPENDENT_AMBULATORY_CARE_PROVIDER_SITE_OTHER): Payer: Medicare Other | Admitting: Ophthalmology

## 2016-11-09 ENCOUNTER — Encounter (INDEPENDENT_AMBULATORY_CARE_PROVIDER_SITE_OTHER): Payer: Medicare Other | Admitting: Ophthalmology

## 2016-11-09 DIAGNOSIS — H43813 Vitreous degeneration, bilateral: Secondary | ICD-10-CM

## 2016-11-09 DIAGNOSIS — H35033 Hypertensive retinopathy, bilateral: Secondary | ICD-10-CM | POA: Diagnosis not present

## 2016-11-09 DIAGNOSIS — I10 Essential (primary) hypertension: Secondary | ICD-10-CM | POA: Diagnosis not present

## 2016-11-09 DIAGNOSIS — E10311 Type 1 diabetes mellitus with unspecified diabetic retinopathy with macular edema: Secondary | ICD-10-CM

## 2016-11-09 DIAGNOSIS — E103513 Type 1 diabetes mellitus with proliferative diabetic retinopathy with macular edema, bilateral: Secondary | ICD-10-CM | POA: Diagnosis not present

## 2016-12-21 ENCOUNTER — Encounter (INDEPENDENT_AMBULATORY_CARE_PROVIDER_SITE_OTHER): Payer: Medicare Other | Admitting: Ophthalmology

## 2016-12-21 DIAGNOSIS — I10 Essential (primary) hypertension: Secondary | ICD-10-CM

## 2016-12-21 DIAGNOSIS — E10311 Type 1 diabetes mellitus with unspecified diabetic retinopathy with macular edema: Secondary | ICD-10-CM | POA: Diagnosis not present

## 2016-12-21 DIAGNOSIS — H43813 Vitreous degeneration, bilateral: Secondary | ICD-10-CM | POA: Diagnosis not present

## 2016-12-21 DIAGNOSIS — E103513 Type 1 diabetes mellitus with proliferative diabetic retinopathy with macular edema, bilateral: Secondary | ICD-10-CM

## 2016-12-21 DIAGNOSIS — H35033 Hypertensive retinopathy, bilateral: Secondary | ICD-10-CM | POA: Diagnosis not present

## 2016-12-21 DIAGNOSIS — H35371 Puckering of macula, right eye: Secondary | ICD-10-CM | POA: Diagnosis not present

## 2017-02-01 ENCOUNTER — Encounter (INDEPENDENT_AMBULATORY_CARE_PROVIDER_SITE_OTHER): Payer: Medicare Other | Admitting: Ophthalmology

## 2017-02-01 DIAGNOSIS — E11311 Type 2 diabetes mellitus with unspecified diabetic retinopathy with macular edema: Secondary | ICD-10-CM | POA: Diagnosis not present

## 2017-02-01 DIAGNOSIS — I10 Essential (primary) hypertension: Secondary | ICD-10-CM | POA: Diagnosis not present

## 2017-02-01 DIAGNOSIS — E113513 Type 2 diabetes mellitus with proliferative diabetic retinopathy with macular edema, bilateral: Secondary | ICD-10-CM | POA: Diagnosis not present

## 2017-02-01 DIAGNOSIS — H43813 Vitreous degeneration, bilateral: Secondary | ICD-10-CM

## 2017-02-01 DIAGNOSIS — H35033 Hypertensive retinopathy, bilateral: Secondary | ICD-10-CM | POA: Diagnosis not present

## 2017-03-15 ENCOUNTER — Encounter (INDEPENDENT_AMBULATORY_CARE_PROVIDER_SITE_OTHER): Payer: Medicare Other | Admitting: Ophthalmology

## 2017-03-15 DIAGNOSIS — I10 Essential (primary) hypertension: Secondary | ICD-10-CM

## 2017-03-15 DIAGNOSIS — H43813 Vitreous degeneration, bilateral: Secondary | ICD-10-CM

## 2017-03-15 DIAGNOSIS — E103513 Type 1 diabetes mellitus with proliferative diabetic retinopathy with macular edema, bilateral: Secondary | ICD-10-CM

## 2017-03-15 DIAGNOSIS — E10311 Type 1 diabetes mellitus with unspecified diabetic retinopathy with macular edema: Secondary | ICD-10-CM

## 2017-03-15 DIAGNOSIS — H35033 Hypertensive retinopathy, bilateral: Secondary | ICD-10-CM | POA: Diagnosis not present

## 2017-04-26 ENCOUNTER — Encounter (INDEPENDENT_AMBULATORY_CARE_PROVIDER_SITE_OTHER): Payer: Medicare Other | Admitting: Ophthalmology

## 2017-04-26 DIAGNOSIS — E10311 Type 1 diabetes mellitus with unspecified diabetic retinopathy with macular edema: Secondary | ICD-10-CM

## 2017-04-26 DIAGNOSIS — H35033 Hypertensive retinopathy, bilateral: Secondary | ICD-10-CM | POA: Diagnosis not present

## 2017-04-26 DIAGNOSIS — E103513 Type 1 diabetes mellitus with proliferative diabetic retinopathy with macular edema, bilateral: Secondary | ICD-10-CM | POA: Diagnosis not present

## 2017-04-26 DIAGNOSIS — I10 Essential (primary) hypertension: Secondary | ICD-10-CM

## 2017-05-31 DIAGNOSIS — E109 Type 1 diabetes mellitus without complications: Secondary | ICD-10-CM | POA: Diagnosis not present

## 2017-05-31 DIAGNOSIS — Z794 Long term (current) use of insulin: Secondary | ICD-10-CM | POA: Diagnosis not present

## 2017-05-31 DIAGNOSIS — E119 Type 2 diabetes mellitus without complications: Secondary | ICD-10-CM | POA: Diagnosis not present

## 2017-06-07 ENCOUNTER — Encounter (INDEPENDENT_AMBULATORY_CARE_PROVIDER_SITE_OTHER): Payer: Medicare Other | Admitting: Ophthalmology

## 2017-06-12 ENCOUNTER — Encounter (INDEPENDENT_AMBULATORY_CARE_PROVIDER_SITE_OTHER): Payer: Medicare Other | Admitting: Ophthalmology

## 2017-06-12 DIAGNOSIS — I10 Essential (primary) hypertension: Secondary | ICD-10-CM

## 2017-06-12 DIAGNOSIS — H35033 Hypertensive retinopathy, bilateral: Secondary | ICD-10-CM | POA: Diagnosis not present

## 2017-06-12 DIAGNOSIS — E103513 Type 1 diabetes mellitus with proliferative diabetic retinopathy with macular edema, bilateral: Secondary | ICD-10-CM

## 2017-06-12 DIAGNOSIS — H43813 Vitreous degeneration, bilateral: Secondary | ICD-10-CM | POA: Diagnosis not present

## 2017-06-12 DIAGNOSIS — E10311 Type 1 diabetes mellitus with unspecified diabetic retinopathy with macular edema: Secondary | ICD-10-CM | POA: Diagnosis not present

## 2017-07-10 DIAGNOSIS — E109 Type 1 diabetes mellitus without complications: Secondary | ICD-10-CM | POA: Diagnosis not present

## 2017-07-25 ENCOUNTER — Encounter (INDEPENDENT_AMBULATORY_CARE_PROVIDER_SITE_OTHER): Payer: Medicare Other | Admitting: Ophthalmology

## 2017-07-25 DIAGNOSIS — H35033 Hypertensive retinopathy, bilateral: Secondary | ICD-10-CM

## 2017-07-25 DIAGNOSIS — E103513 Type 1 diabetes mellitus with proliferative diabetic retinopathy with macular edema, bilateral: Secondary | ICD-10-CM

## 2017-07-25 DIAGNOSIS — E10311 Type 1 diabetes mellitus with unspecified diabetic retinopathy with macular edema: Secondary | ICD-10-CM

## 2017-07-25 DIAGNOSIS — H43813 Vitreous degeneration, bilateral: Secondary | ICD-10-CM | POA: Diagnosis not present

## 2017-07-25 DIAGNOSIS — I10 Essential (primary) hypertension: Secondary | ICD-10-CM

## 2017-08-16 DIAGNOSIS — E109 Type 1 diabetes mellitus without complications: Secondary | ICD-10-CM | POA: Diagnosis not present

## 2017-09-05 ENCOUNTER — Encounter (INDEPENDENT_AMBULATORY_CARE_PROVIDER_SITE_OTHER): Payer: Medicare Other | Admitting: Ophthalmology

## 2017-09-05 DIAGNOSIS — E10311 Type 1 diabetes mellitus with unspecified diabetic retinopathy with macular edema: Secondary | ICD-10-CM

## 2017-09-05 DIAGNOSIS — H35033 Hypertensive retinopathy, bilateral: Secondary | ICD-10-CM | POA: Diagnosis not present

## 2017-09-05 DIAGNOSIS — H35371 Puckering of macula, right eye: Secondary | ICD-10-CM | POA: Diagnosis not present

## 2017-09-05 DIAGNOSIS — E103513 Type 1 diabetes mellitus with proliferative diabetic retinopathy with macular edema, bilateral: Secondary | ICD-10-CM

## 2017-09-05 DIAGNOSIS — I10 Essential (primary) hypertension: Secondary | ICD-10-CM | POA: Diagnosis not present

## 2017-09-05 DIAGNOSIS — H43813 Vitreous degeneration, bilateral: Secondary | ICD-10-CM | POA: Diagnosis not present

## 2017-10-02 DIAGNOSIS — E109 Type 1 diabetes mellitus without complications: Secondary | ICD-10-CM | POA: Diagnosis not present

## 2017-10-02 DIAGNOSIS — E119 Type 2 diabetes mellitus without complications: Secondary | ICD-10-CM | POA: Diagnosis not present

## 2017-10-17 ENCOUNTER — Encounter (INDEPENDENT_AMBULATORY_CARE_PROVIDER_SITE_OTHER): Payer: Medicare Other | Admitting: Ophthalmology

## 2017-10-17 DIAGNOSIS — E11311 Type 2 diabetes mellitus with unspecified diabetic retinopathy with macular edema: Secondary | ICD-10-CM | POA: Diagnosis not present

## 2017-10-17 DIAGNOSIS — E113513 Type 2 diabetes mellitus with proliferative diabetic retinopathy with macular edema, bilateral: Secondary | ICD-10-CM | POA: Diagnosis not present

## 2017-10-17 DIAGNOSIS — H43813 Vitreous degeneration, bilateral: Secondary | ICD-10-CM | POA: Diagnosis not present

## 2017-10-17 DIAGNOSIS — I10 Essential (primary) hypertension: Secondary | ICD-10-CM

## 2017-10-17 DIAGNOSIS — H35033 Hypertensive retinopathy, bilateral: Secondary | ICD-10-CM | POA: Diagnosis not present

## 2017-10-23 DIAGNOSIS — H401131 Primary open-angle glaucoma, bilateral, mild stage: Secondary | ICD-10-CM | POA: Diagnosis not present

## 2017-10-23 DIAGNOSIS — Z961 Presence of intraocular lens: Secondary | ICD-10-CM | POA: Diagnosis not present

## 2017-10-23 DIAGNOSIS — H26492 Other secondary cataract, left eye: Secondary | ICD-10-CM | POA: Diagnosis not present

## 2017-10-23 DIAGNOSIS — E083513 Diabetes mellitus due to underlying condition with proliferative diabetic retinopathy with macular edema, bilateral: Secondary | ICD-10-CM | POA: Diagnosis not present

## 2017-10-24 DIAGNOSIS — E1065 Type 1 diabetes mellitus with hyperglycemia: Secondary | ICD-10-CM | POA: Diagnosis not present

## 2017-11-11 DIAGNOSIS — E119 Type 2 diabetes mellitus without complications: Secondary | ICD-10-CM | POA: Diagnosis not present

## 2017-11-11 DIAGNOSIS — E109 Type 1 diabetes mellitus without complications: Secondary | ICD-10-CM | POA: Diagnosis not present

## 2017-11-29 ENCOUNTER — Encounter (INDEPENDENT_AMBULATORY_CARE_PROVIDER_SITE_OTHER): Payer: Medicare Other | Admitting: Ophthalmology

## 2017-11-29 DIAGNOSIS — H35033 Hypertensive retinopathy, bilateral: Secondary | ICD-10-CM | POA: Diagnosis not present

## 2017-11-29 DIAGNOSIS — E10311 Type 1 diabetes mellitus with unspecified diabetic retinopathy with macular edema: Secondary | ICD-10-CM

## 2017-11-29 DIAGNOSIS — E103513 Type 1 diabetes mellitus with proliferative diabetic retinopathy with macular edema, bilateral: Secondary | ICD-10-CM | POA: Diagnosis not present

## 2017-11-29 DIAGNOSIS — I10 Essential (primary) hypertension: Secondary | ICD-10-CM | POA: Diagnosis not present

## 2017-11-29 DIAGNOSIS — H43813 Vitreous degeneration, bilateral: Secondary | ICD-10-CM

## 2017-12-23 DIAGNOSIS — Z794 Long term (current) use of insulin: Secondary | ICD-10-CM | POA: Diagnosis not present

## 2017-12-23 DIAGNOSIS — E109 Type 1 diabetes mellitus without complications: Secondary | ICD-10-CM | POA: Diagnosis not present

## 2017-12-23 DIAGNOSIS — E119 Type 2 diabetes mellitus without complications: Secondary | ICD-10-CM | POA: Diagnosis not present

## 2018-01-10 ENCOUNTER — Encounter (INDEPENDENT_AMBULATORY_CARE_PROVIDER_SITE_OTHER): Payer: Medicare Other | Admitting: Ophthalmology

## 2018-01-10 DIAGNOSIS — I10 Essential (primary) hypertension: Secondary | ICD-10-CM | POA: Diagnosis not present

## 2018-01-10 DIAGNOSIS — H35372 Puckering of macula, left eye: Secondary | ICD-10-CM

## 2018-01-10 DIAGNOSIS — H35033 Hypertensive retinopathy, bilateral: Secondary | ICD-10-CM | POA: Diagnosis not present

## 2018-01-10 DIAGNOSIS — E11311 Type 2 diabetes mellitus with unspecified diabetic retinopathy with macular edema: Secondary | ICD-10-CM | POA: Diagnosis not present

## 2018-01-10 DIAGNOSIS — E113513 Type 2 diabetes mellitus with proliferative diabetic retinopathy with macular edema, bilateral: Secondary | ICD-10-CM | POA: Diagnosis not present

## 2018-01-10 DIAGNOSIS — H43813 Vitreous degeneration, bilateral: Secondary | ICD-10-CM

## 2018-01-18 DIAGNOSIS — Z23 Encounter for immunization: Secondary | ICD-10-CM | POA: Diagnosis not present

## 2018-01-19 DIAGNOSIS — Z23 Encounter for immunization: Secondary | ICD-10-CM | POA: Diagnosis not present

## 2018-01-26 DIAGNOSIS — E109 Type 1 diabetes mellitus without complications: Secondary | ICD-10-CM | POA: Diagnosis not present

## 2018-01-26 DIAGNOSIS — E119 Type 2 diabetes mellitus without complications: Secondary | ICD-10-CM | POA: Diagnosis not present

## 2018-01-26 DIAGNOSIS — Z794 Long term (current) use of insulin: Secondary | ICD-10-CM | POA: Diagnosis not present

## 2018-02-22 ENCOUNTER — Encounter (INDEPENDENT_AMBULATORY_CARE_PROVIDER_SITE_OTHER): Payer: Medicare Other | Admitting: Ophthalmology

## 2018-02-22 DIAGNOSIS — I10 Essential (primary) hypertension: Secondary | ICD-10-CM

## 2018-02-22 DIAGNOSIS — E11311 Type 2 diabetes mellitus with unspecified diabetic retinopathy with macular edema: Secondary | ICD-10-CM

## 2018-02-22 DIAGNOSIS — H35033 Hypertensive retinopathy, bilateral: Secondary | ICD-10-CM

## 2018-02-22 DIAGNOSIS — E113513 Type 2 diabetes mellitus with proliferative diabetic retinopathy with macular edema, bilateral: Secondary | ICD-10-CM | POA: Diagnosis not present

## 2018-02-22 DIAGNOSIS — H43813 Vitreous degeneration, bilateral: Secondary | ICD-10-CM

## 2018-03-17 DIAGNOSIS — E109 Type 1 diabetes mellitus without complications: Secondary | ICD-10-CM | POA: Diagnosis not present

## 2018-03-17 DIAGNOSIS — Z794 Long term (current) use of insulin: Secondary | ICD-10-CM | POA: Diagnosis not present

## 2018-03-17 DIAGNOSIS — E119 Type 2 diabetes mellitus without complications: Secondary | ICD-10-CM | POA: Diagnosis not present

## 2018-04-05 ENCOUNTER — Encounter (INDEPENDENT_AMBULATORY_CARE_PROVIDER_SITE_OTHER): Payer: Medicare Other | Admitting: Ophthalmology

## 2018-04-05 DIAGNOSIS — H35033 Hypertensive retinopathy, bilateral: Secondary | ICD-10-CM

## 2018-04-05 DIAGNOSIS — H35372 Puckering of macula, left eye: Secondary | ICD-10-CM

## 2018-04-05 DIAGNOSIS — I10 Essential (primary) hypertension: Secondary | ICD-10-CM | POA: Diagnosis not present

## 2018-04-05 DIAGNOSIS — H43813 Vitreous degeneration, bilateral: Secondary | ICD-10-CM

## 2018-04-05 DIAGNOSIS — E11311 Type 2 diabetes mellitus with unspecified diabetic retinopathy with macular edema: Secondary | ICD-10-CM | POA: Diagnosis not present

## 2018-04-05 DIAGNOSIS — E113513 Type 2 diabetes mellitus with proliferative diabetic retinopathy with macular edema, bilateral: Secondary | ICD-10-CM | POA: Diagnosis not present

## 2018-04-17 DIAGNOSIS — J209 Acute bronchitis, unspecified: Secondary | ICD-10-CM | POA: Diagnosis not present

## 2018-04-17 DIAGNOSIS — J029 Acute pharyngitis, unspecified: Secondary | ICD-10-CM | POA: Diagnosis not present

## 2018-04-17 DIAGNOSIS — J069 Acute upper respiratory infection, unspecified: Secondary | ICD-10-CM | POA: Diagnosis not present

## 2018-04-24 DIAGNOSIS — H401131 Primary open-angle glaucoma, bilateral, mild stage: Secondary | ICD-10-CM | POA: Diagnosis not present

## 2018-04-24 DIAGNOSIS — H26492 Other secondary cataract, left eye: Secondary | ICD-10-CM | POA: Diagnosis not present

## 2018-04-27 DIAGNOSIS — E119 Type 2 diabetes mellitus without complications: Secondary | ICD-10-CM | POA: Diagnosis not present

## 2018-04-27 DIAGNOSIS — Z794 Long term (current) use of insulin: Secondary | ICD-10-CM | POA: Diagnosis not present

## 2018-04-27 DIAGNOSIS — E109 Type 1 diabetes mellitus without complications: Secondary | ICD-10-CM | POA: Diagnosis not present

## 2018-05-17 ENCOUNTER — Encounter (INDEPENDENT_AMBULATORY_CARE_PROVIDER_SITE_OTHER): Payer: Medicare Other | Admitting: Ophthalmology

## 2018-05-17 DIAGNOSIS — E113513 Type 2 diabetes mellitus with proliferative diabetic retinopathy with macular edema, bilateral: Secondary | ICD-10-CM | POA: Diagnosis not present

## 2018-05-17 DIAGNOSIS — H35371 Puckering of macula, right eye: Secondary | ICD-10-CM

## 2018-05-17 DIAGNOSIS — E11311 Type 2 diabetes mellitus with unspecified diabetic retinopathy with macular edema: Secondary | ICD-10-CM | POA: Diagnosis not present

## 2018-05-17 DIAGNOSIS — H43813 Vitreous degeneration, bilateral: Secondary | ICD-10-CM

## 2018-05-17 DIAGNOSIS — I10 Essential (primary) hypertension: Secondary | ICD-10-CM

## 2018-05-17 DIAGNOSIS — H35033 Hypertensive retinopathy, bilateral: Secondary | ICD-10-CM

## 2018-05-31 DIAGNOSIS — E119 Type 2 diabetes mellitus without complications: Secondary | ICD-10-CM | POA: Diagnosis not present

## 2018-05-31 DIAGNOSIS — E109 Type 1 diabetes mellitus without complications: Secondary | ICD-10-CM | POA: Diagnosis not present

## 2018-05-31 DIAGNOSIS — Z794 Long term (current) use of insulin: Secondary | ICD-10-CM | POA: Diagnosis not present

## 2018-06-27 ENCOUNTER — Encounter (INDEPENDENT_AMBULATORY_CARE_PROVIDER_SITE_OTHER): Payer: Medicare Other | Admitting: Ophthalmology

## 2018-06-27 DIAGNOSIS — E113513 Type 2 diabetes mellitus with proliferative diabetic retinopathy with macular edema, bilateral: Secondary | ICD-10-CM

## 2018-06-27 DIAGNOSIS — H43813 Vitreous degeneration, bilateral: Secondary | ICD-10-CM

## 2018-06-27 DIAGNOSIS — E11311 Type 2 diabetes mellitus with unspecified diabetic retinopathy with macular edema: Secondary | ICD-10-CM

## 2018-06-27 DIAGNOSIS — I10 Essential (primary) hypertension: Secondary | ICD-10-CM

## 2018-06-27 DIAGNOSIS — H35033 Hypertensive retinopathy, bilateral: Secondary | ICD-10-CM | POA: Diagnosis not present

## 2018-07-03 DIAGNOSIS — E119 Type 2 diabetes mellitus without complications: Secondary | ICD-10-CM | POA: Diagnosis not present

## 2018-07-03 DIAGNOSIS — Z794 Long term (current) use of insulin: Secondary | ICD-10-CM | POA: Diagnosis not present

## 2018-07-03 DIAGNOSIS — E109 Type 1 diabetes mellitus without complications: Secondary | ICD-10-CM | POA: Diagnosis not present

## 2018-08-08 ENCOUNTER — Encounter (INDEPENDENT_AMBULATORY_CARE_PROVIDER_SITE_OTHER): Payer: Medicare Other | Admitting: Ophthalmology

## 2018-08-09 DIAGNOSIS — E785 Hyperlipidemia, unspecified: Secondary | ICD-10-CM | POA: Diagnosis not present

## 2018-08-09 DIAGNOSIS — J309 Allergic rhinitis, unspecified: Secondary | ICD-10-CM | POA: Diagnosis not present

## 2018-08-09 DIAGNOSIS — F17210 Nicotine dependence, cigarettes, uncomplicated: Secondary | ICD-10-CM | POA: Diagnosis not present

## 2018-08-09 DIAGNOSIS — E11319 Type 2 diabetes mellitus with unspecified diabetic retinopathy without macular edema: Secondary | ICD-10-CM | POA: Diagnosis not present

## 2018-08-14 ENCOUNTER — Encounter (HOSPITAL_COMMUNITY): Payer: Self-pay | Admitting: Emergency Medicine

## 2018-08-14 ENCOUNTER — Other Ambulatory Visit: Payer: Self-pay

## 2018-08-14 ENCOUNTER — Emergency Department (HOSPITAL_COMMUNITY)
Admission: EM | Admit: 2018-08-14 | Discharge: 2018-08-15 | Disposition: A | Discharge status: Home / Self Care | Payer: Medicare Other | Attending: Emergency Medicine | Admitting: Emergency Medicine

## 2018-08-14 DIAGNOSIS — R410 Disorientation, unspecified: Secondary | ICD-10-CM | POA: Diagnosis not present

## 2018-08-14 DIAGNOSIS — Z79899 Other long term (current) drug therapy: Secondary | ICD-10-CM | POA: Diagnosis not present

## 2018-08-14 DIAGNOSIS — J449 Chronic obstructive pulmonary disease, unspecified: Secondary | ICD-10-CM | POA: Diagnosis not present

## 2018-08-14 DIAGNOSIS — Z794 Long term (current) use of insulin: Secondary | ICD-10-CM | POA: Diagnosis not present

## 2018-08-14 DIAGNOSIS — E161 Other hypoglycemia: Secondary | ICD-10-CM | POA: Diagnosis not present

## 2018-08-14 DIAGNOSIS — E162 Hypoglycemia, unspecified: Secondary | ICD-10-CM

## 2018-08-14 DIAGNOSIS — E11649 Type 2 diabetes mellitus with hypoglycemia without coma: Secondary | ICD-10-CM | POA: Insufficient documentation

## 2018-08-14 DIAGNOSIS — Z87891 Personal history of nicotine dependence: Secondary | ICD-10-CM | POA: Insufficient documentation

## 2018-08-14 DIAGNOSIS — I10 Essential (primary) hypertension: Secondary | ICD-10-CM | POA: Diagnosis not present

## 2018-08-14 DIAGNOSIS — E109 Type 1 diabetes mellitus without complications: Secondary | ICD-10-CM | POA: Diagnosis not present

## 2018-08-14 DIAGNOSIS — E119 Type 2 diabetes mellitus without complications: Secondary | ICD-10-CM | POA: Diagnosis not present

## 2018-08-14 LAB — CBC WITH DIFFERENTIAL/PLATELET
Abs Immature Granulocytes: 0.03 10*3/uL (ref 0.00–0.07)
Basophils Absolute: 0 10*3/uL (ref 0.0–0.1)
Basophils Relative: 0 %
Eosinophils Absolute: 0 10*3/uL (ref 0.0–0.5)
Eosinophils Relative: 0 %
HCT: 45.6 % (ref 39.0–52.0)
Hemoglobin: 15.8 g/dL (ref 13.0–17.0)
Immature Granulocytes: 0 %
Lymphocytes Relative: 8 %
Lymphs Abs: 0.8 10*3/uL (ref 0.7–4.0)
MCH: 32.6 pg (ref 26.0–34.0)
MCHC: 34.6 g/dL (ref 30.0–36.0)
MCV: 94 fL (ref 80.0–100.0)
Monocytes Absolute: 0.4 10*3/uL (ref 0.1–1.0)
Monocytes Relative: 4 %
Neutro Abs: 9.9 10*3/uL — ABNORMAL HIGH (ref 1.7–7.7)
Neutrophils Relative %: 88 %
Platelets: 295 10*3/uL (ref 150–400)
RBC: 4.85 MIL/uL (ref 4.22–5.81)
RDW: 12.8 % (ref 11.5–15.5)
WBC: 11.2 10*3/uL — ABNORMAL HIGH (ref 4.0–10.5)
nRBC: 0 % (ref 0.0–0.2)

## 2018-08-14 LAB — COMPREHENSIVE METABOLIC PANEL
ALT: 28 U/L (ref 0–44)
AST: 26 U/L (ref 15–41)
Albumin: 4.3 g/dL (ref 3.5–5.0)
Alkaline Phosphatase: 92 U/L (ref 38–126)
Anion gap: 13 (ref 5–15)
BUN: 18 mg/dL (ref 8–23)
CO2: 30 mmol/L (ref 22–32)
Calcium: 9.3 mg/dL (ref 8.9–10.3)
Chloride: 96 mmol/L — ABNORMAL LOW (ref 98–111)
Creatinine, Ser: 0.97 mg/dL (ref 0.61–1.24)
GFR calc Af Amer: >60 mL/min (ref >60)
GFR calc non Af Amer: >60 mL/min (ref >60)
Glucose, Bld: 224 mg/dL — ABNORMAL HIGH (ref 70–99)
Potassium: 4.2 mmol/L (ref 3.5–5.1)
Sodium: 139 mmol/L (ref 135–145)
Total Bilirubin: 1 mg/dL (ref 0.3–1.2)
Total Protein: 7.5 g/dL (ref 6.5–8.1)

## 2018-08-14 LAB — URINALYSIS, ROUTINE W REFLEX MICROSCOPIC
Bilirubin Urine: NEGATIVE
Glucose, UA: 50 mg/dL — AB
Hgb urine dipstick: NEGATIVE
Ketones, ur: 20 mg/dL — AB
Leukocytes,Ua: NEGATIVE
Nitrite: NEGATIVE
Protein, ur: NEGATIVE mg/dL
Specific Gravity, Urine: 1.013 (ref 1.005–1.030)
pH: 5 (ref 5.0–8.0)

## 2018-08-14 LAB — CBG MONITORING, ED: Glucose-Capillary: 142 mg/dL — ABNORMAL HIGH (ref 70–99)

## 2018-08-14 NOTE — ED Notes (Signed)
Pt ambulated  to bathroom no help did  just fine.

## 2018-08-14 NOTE — ED Notes (Signed)
Pt ambulated to restroom without difficulty

## 2018-08-14 NOTE — Discharge Instructions (Addendum)
Return if any problems. Monitor your glucose carefully

## 2018-08-14 NOTE — ED Triage Notes (Signed)
Pt brought from home via [facility]. Pt states around 1300 today he walked to store and he "felt funny when I came out of the store." Pt states he got home around 1330 and the next he remembers is "waking up at 1900 this afternoon. Pt states he ate a banana, crackers, and peanut butter then felt better. Also reports he felt weak in his legs and knees and could not get up from his chair.

## 2018-08-15 NOTE — ED Provider Notes (Signed)
[facility] EMERGENCY DEPARTMENT Provider Note   CSN: [id number] Arrival date & time: 08/14/18  2026    History   Chief Complaint Chief Complaint  Patient presents with  . Hypoglycemia    HPI Douglas [name]. is a 68 y.o. male.     The history is provided by the patient. No language interpreter was used.  Hypoglycemia  Initial blood sugar:  224 Severity:  Moderate Onset quality:  Gradual Timing:  Constant Diabetic status:  Controlled with insulin Context: exercise   Relieved by:  Eating Ineffective treatments:  None tried Associated symptoms: no altered mental status   Risk factors: no alcohol abuse   Pt reports he went for a walk around 3 and sat down in his recliner.  Pt reports when he woke up it was 7pm. Pt thinks his glucose may have dropped.  Pt reports he has had episodes of hypoglycemia in the past.  Pt reports when e woke up both legs were numb and tingly.  Pt reports both legs are normal now  Past Medical History:  Diagnosis Date  . COPD (chronic obstructive pulmonary disease) ([place])   . Diabetes mellitus without complication ([place])   . Hypercholesteremia   . Hypertension     Patient Active Problem List   Diagnosis Date Noted  . PULMONARY NODULE 04/10/2008  . HYPERLIPIDEMIA 03/24/2008  . PNEUMONIA ORGANISM NOS 03/24/2008  . EMPHYSEMA 03/24/2008  . COPD UNSPECIFIED 03/24/2008    Past Surgical History:  Procedure Laterality Date  . COLONOSCOPY N/A 04/05/2013   Procedure: COLONOSCOPY;  Surgeon: Danie Binder, MD;  Location: [facility];  Service: Endoscopy;  Laterality: N/A;  8:30 AM  . NO PAST SURGERIES          Home Medications    Prior to Admission medications   Medication Sig Start Date End Date Taking? Authorizing Provider  atorvastatin (LIPITOR) 40 MG tablet Take 40 mg by mouth daily.   Yes [provider]  brimonidine (ALPHAGAN) 0.2 % ophthalmic solution Place 1 drop into the left eye 2 (two) times daily.    Yes [provider]  dorzolamide (TRUSOPT) 2 % ophthalmic solution  07/25/18  Yes [provider]  enalapril (VASOTEC) 10 MG tablet Take 10 mg by mouth daily.   Yes [provider]  fluticasone (FLONASE) 50 MCG/ACT nasal spray Place 2 sprays into both nostrils daily.  08/09/18  Yes [provider]  hydrochlorothiazide (HYDRODIURIL) 25 MG tablet Take 25 mg by mouth daily.   Yes [provider]  Insulin Human (INSULIN PUMP) 100 unit/ml SOLN Inject into the skin. Humalog Insulin Pump.  Approx. 60 units a day.   Yes [provider]  insulin lispro (HUMALOG KWIKPEN) 100 UNIT/ML KwikPen USE BEFORE MEALS INJECT SUBCUTANEOUSLY UP TO 65 UNITS DAILY. 02/21/18  Yes [provider]  insulin NPH Human (HUMULIN N) 100 UNIT/ML injection INJECT 10 UNITS IN THE MORNING AND 5 UNITS AT BEDTIME. 06/26/18  Yes [provider]  timolol (TIMOPTIC) 0.25 % ophthalmic solution  07/25/18  Yes [provider]  FLUoxetine (PROZAC) 20 MG capsule Take 60 mg by mouth daily.     [provider]    Family History Family History  Problem Relation Age of Onset  . Colon cancer Neg Hx     Social History Social History   Tobacco Use  . Smoking status: Former Smoker    Packs/day: 2.00    Years: 35.00    Pack years: 70.00  Types: Cigarettes  . Smokeless tobacco: Never Used  Substance Use Topics  . Alcohol use: No  . Drug use: No     Allergies   Patient has no known allergies.   Review of Systems Review of Systems  All other systems reviewed and are negative.    Physical Exam Updated Vital Signs BP (!) 148/75 Comment: Simultaneous filing. User may not have seen previous data.  Pulse 86   Temp (!) 97.5 F (36.4 C) (Oral)   Resp 16   Ht 5\' 9"  (1.753 m)   Wt 78 kg   SpO2 97%   BMI 25.40 kg/m   Physical Exam Vitals signs and nursing note reviewed.  Constitutional:      Appearance: He is well-developed.  HENT:     Head:  Normocephalic.     Right Ear: External ear normal.     Left Ear: External ear normal.     Mouth/Throat:     Mouth: Mucous membranes are moist.  Eyes:     Pupils: Pupils are equal, round, and reactive to light.  Neck:     Musculoskeletal: Normal range of motion.  Cardiovascular:     Rate and Rhythm: Normal rate and regular rhythm.  Pulmonary:     Effort: Pulmonary effort is normal.  Abdominal:     General: Abdomen is flat. There is no distension.  Musculoskeletal: Normal range of motion.  Skin:    General: Skin is warm.  Neurological:     General: No focal deficit present.     Mental Status: He is alert and oriented to person, place, and time.  Psychiatric:        Mood and Affect: Mood normal.      ED Treatments / Results  Labs (all labs ordered are listed, but only abnormal results are displayed) Labs Reviewed  CBC WITH DIFFERENTIAL/PLATELET - Abnormal; Notable for the following components:      Result Value   WBC 11.2 (*)    Neutro Abs 9.9 (*)    All other components within normal limits  COMPREHENSIVE METABOLIC PANEL - Abnormal; Notable for the following components:   Chloride 96 (*)    Glucose, Bld 224 (*)    All other components within normal limits  URINALYSIS, ROUTINE W REFLEX MICROSCOPIC - Abnormal; Notable for the following components:   APPearance HAZY (*)    Glucose, UA 50 (*)    Ketones, ur 20 (*)    All other components within normal limits  CBG MONITORING, ED - Abnormal; Notable for the following components:   Glucose-Capillary 142 (*)    All other components within normal limits    EKG None  Radiology No results found.  Procedures Procedures (including critical care time)  Medications Ordered in ED Medications - No data to display   Initial Impression / Assessment and Plan / ED Course  I have reviewed the triage vital signs and the nursing notes.  Pertinent labs & imaging results that were available during my care of the patient were  reviewed by me and considered in my medical decision making (see chart for details).        MDM  Pt has a normal gait.  Pt feels normal.  Dr. Rogene Houston in to see and examine.  Pt has normal neuro exam.  Pt observed.  Pt advised to monitor glucose carefully   Final Clinical Impressions(s) / ED Diagnoses   Final diagnoses:  Hypoglycemia    ED Discharge Orders    None  Fransico Meadow, PA-C 08/16/18 0004    Fredia Sorrow, MD 08/21/18 [id number]

## 2018-08-23 ENCOUNTER — Other Ambulatory Visit: Payer: Self-pay

## 2018-08-23 ENCOUNTER — Encounter (INDEPENDENT_AMBULATORY_CARE_PROVIDER_SITE_OTHER): Payer: Medicare Other | Admitting: Ophthalmology

## 2018-08-23 DIAGNOSIS — I10 Essential (primary) hypertension: Secondary | ICD-10-CM

## 2018-08-23 DIAGNOSIS — E11311 Type 2 diabetes mellitus with unspecified diabetic retinopathy with macular edema: Secondary | ICD-10-CM | POA: Diagnosis not present

## 2018-08-23 DIAGNOSIS — E113513 Type 2 diabetes mellitus with proliferative diabetic retinopathy with macular edema, bilateral: Secondary | ICD-10-CM

## 2018-08-23 DIAGNOSIS — H43813 Vitreous degeneration, bilateral: Secondary | ICD-10-CM

## 2018-08-23 DIAGNOSIS — H35033 Hypertensive retinopathy, bilateral: Secondary | ICD-10-CM | POA: Diagnosis not present

## 2018-09-24 DIAGNOSIS — E109 Type 1 diabetes mellitus without complications: Secondary | ICD-10-CM | POA: Diagnosis not present

## 2018-09-24 DIAGNOSIS — E119 Type 2 diabetes mellitus without complications: Secondary | ICD-10-CM | POA: Diagnosis not present

## 2018-09-24 DIAGNOSIS — Z794 Long term (current) use of insulin: Secondary | ICD-10-CM | POA: Diagnosis not present

## 2018-10-04 ENCOUNTER — Encounter (INDEPENDENT_AMBULATORY_CARE_PROVIDER_SITE_OTHER): Payer: Medicare Other | Admitting: Ophthalmology

## 2018-10-04 ENCOUNTER — Other Ambulatory Visit: Payer: Self-pay

## 2018-10-04 DIAGNOSIS — E113513 Type 2 diabetes mellitus with proliferative diabetic retinopathy with macular edema, bilateral: Secondary | ICD-10-CM

## 2018-10-04 DIAGNOSIS — E11311 Type 2 diabetes mellitus with unspecified diabetic retinopathy with macular edema: Secondary | ICD-10-CM

## 2018-10-04 DIAGNOSIS — H35371 Puckering of macula, right eye: Secondary | ICD-10-CM

## 2018-10-04 DIAGNOSIS — I10 Essential (primary) hypertension: Secondary | ICD-10-CM

## 2018-10-04 DIAGNOSIS — H43813 Vitreous degeneration, bilateral: Secondary | ICD-10-CM

## 2018-10-04 DIAGNOSIS — H35033 Hypertensive retinopathy, bilateral: Secondary | ICD-10-CM | POA: Diagnosis not present

## 2018-11-01 DIAGNOSIS — Z961 Presence of intraocular lens: Secondary | ICD-10-CM | POA: Diagnosis not present

## 2018-11-01 DIAGNOSIS — H35033 Hypertensive retinopathy, bilateral: Secondary | ICD-10-CM | POA: Diagnosis not present

## 2018-11-01 DIAGNOSIS — H401131 Primary open-angle glaucoma, bilateral, mild stage: Secondary | ICD-10-CM | POA: Diagnosis not present

## 2018-11-01 DIAGNOSIS — E113513 Type 2 diabetes mellitus with proliferative diabetic retinopathy with macular edema, bilateral: Secondary | ICD-10-CM | POA: Diagnosis not present

## 2018-11-06 DIAGNOSIS — E1042 Type 1 diabetes mellitus with diabetic polyneuropathy: Secondary | ICD-10-CM | POA: Diagnosis not present

## 2018-11-06 DIAGNOSIS — E162 Hypoglycemia, unspecified: Secondary | ICD-10-CM | POA: Diagnosis not present

## 2018-11-06 DIAGNOSIS — Z794 Long term (current) use of insulin: Secondary | ICD-10-CM | POA: Diagnosis not present

## 2018-11-06 DIAGNOSIS — E7800 Pure hypercholesterolemia, unspecified: Secondary | ICD-10-CM | POA: Diagnosis not present

## 2018-11-06 DIAGNOSIS — E10319 Type 1 diabetes mellitus with unspecified diabetic retinopathy without macular edema: Secondary | ICD-10-CM | POA: Diagnosis not present

## 2018-11-06 DIAGNOSIS — I10 Essential (primary) hypertension: Secondary | ICD-10-CM | POA: Diagnosis not present

## 2018-11-06 DIAGNOSIS — E1065 Type 1 diabetes mellitus with hyperglycemia: Secondary | ICD-10-CM | POA: Diagnosis not present

## 2018-11-06 DIAGNOSIS — Z79899 Other long term (current) drug therapy: Secondary | ICD-10-CM | POA: Diagnosis not present

## 2018-11-07 DIAGNOSIS — E785 Hyperlipidemia, unspecified: Secondary | ICD-10-CM | POA: Diagnosis not present

## 2018-11-07 DIAGNOSIS — Z125 Encounter for screening for malignant neoplasm of prostate: Secondary | ICD-10-CM | POA: Diagnosis not present

## 2018-11-07 DIAGNOSIS — Z79899 Other long term (current) drug therapy: Secondary | ICD-10-CM | POA: Diagnosis not present

## 2018-11-07 DIAGNOSIS — I10 Essential (primary) hypertension: Secondary | ICD-10-CM | POA: Diagnosis not present

## 2018-11-08 ENCOUNTER — Encounter (INDEPENDENT_AMBULATORY_CARE_PROVIDER_SITE_OTHER): Payer: Medicare Other | Admitting: Ophthalmology

## 2018-11-08 ENCOUNTER — Other Ambulatory Visit: Payer: Self-pay

## 2018-11-08 DIAGNOSIS — I10 Essential (primary) hypertension: Secondary | ICD-10-CM | POA: Diagnosis not present

## 2018-11-08 DIAGNOSIS — E103513 Type 1 diabetes mellitus with proliferative diabetic retinopathy with macular edema, bilateral: Secondary | ICD-10-CM | POA: Diagnosis not present

## 2018-11-08 DIAGNOSIS — H35033 Hypertensive retinopathy, bilateral: Secondary | ICD-10-CM | POA: Diagnosis not present

## 2018-11-08 DIAGNOSIS — H43813 Vitreous degeneration, bilateral: Secondary | ICD-10-CM

## 2018-11-08 DIAGNOSIS — E10311 Type 1 diabetes mellitus with unspecified diabetic retinopathy with macular edema: Secondary | ICD-10-CM | POA: Diagnosis not present

## 2018-11-13 DIAGNOSIS — H409 Unspecified glaucoma: Secondary | ICD-10-CM | POA: Diagnosis not present

## 2018-11-13 DIAGNOSIS — E103513 Type 1 diabetes mellitus with proliferative diabetic retinopathy with macular edema, bilateral: Secondary | ICD-10-CM | POA: Diagnosis not present

## 2018-11-13 DIAGNOSIS — I10 Essential (primary) hypertension: Secondary | ICD-10-CM | POA: Diagnosis not present

## 2018-11-13 DIAGNOSIS — Z794 Long term (current) use of insulin: Secondary | ICD-10-CM | POA: Diagnosis not present

## 2018-12-12 ENCOUNTER — Other Ambulatory Visit: Payer: Self-pay

## 2018-12-12 ENCOUNTER — Encounter (INDEPENDENT_AMBULATORY_CARE_PROVIDER_SITE_OTHER): Payer: Medicare Other | Admitting: Ophthalmology

## 2018-12-12 DIAGNOSIS — H35033 Hypertensive retinopathy, bilateral: Secondary | ICD-10-CM

## 2018-12-12 DIAGNOSIS — I10 Essential (primary) hypertension: Secondary | ICD-10-CM | POA: Diagnosis not present

## 2018-12-12 DIAGNOSIS — E10311 Type 1 diabetes mellitus with unspecified diabetic retinopathy with macular edema: Secondary | ICD-10-CM | POA: Diagnosis not present

## 2018-12-12 DIAGNOSIS — H43813 Vitreous degeneration, bilateral: Secondary | ICD-10-CM

## 2018-12-12 DIAGNOSIS — E103513 Type 1 diabetes mellitus with proliferative diabetic retinopathy with macular edema, bilateral: Secondary | ICD-10-CM | POA: Diagnosis not present

## 2019-01-10 ENCOUNTER — Encounter (INDEPENDENT_AMBULATORY_CARE_PROVIDER_SITE_OTHER): Payer: Medicare Other | Admitting: Ophthalmology

## 2019-01-10 ENCOUNTER — Other Ambulatory Visit: Payer: Self-pay

## 2019-01-10 DIAGNOSIS — H35372 Puckering of macula, left eye: Secondary | ICD-10-CM

## 2019-01-10 DIAGNOSIS — E113513 Type 2 diabetes mellitus with proliferative diabetic retinopathy with macular edema, bilateral: Secondary | ICD-10-CM | POA: Diagnosis not present

## 2019-01-10 DIAGNOSIS — E11311 Type 2 diabetes mellitus with unspecified diabetic retinopathy with macular edema: Secondary | ICD-10-CM

## 2019-01-10 DIAGNOSIS — I10 Essential (primary) hypertension: Secondary | ICD-10-CM | POA: Diagnosis not present

## 2019-01-10 DIAGNOSIS — H35033 Hypertensive retinopathy, bilateral: Secondary | ICD-10-CM | POA: Diagnosis not present

## 2019-01-10 DIAGNOSIS — H43813 Vitreous degeneration, bilateral: Secondary | ICD-10-CM

## 2019-02-15 ENCOUNTER — Other Ambulatory Visit: Payer: Self-pay

## 2019-02-15 ENCOUNTER — Encounter (INDEPENDENT_AMBULATORY_CARE_PROVIDER_SITE_OTHER): Payer: Medicare Other | Admitting: Ophthalmology

## 2019-02-15 DIAGNOSIS — H43813 Vitreous degeneration, bilateral: Secondary | ICD-10-CM

## 2019-02-15 DIAGNOSIS — E10311 Type 1 diabetes mellitus with unspecified diabetic retinopathy with macular edema: Secondary | ICD-10-CM | POA: Diagnosis not present

## 2019-02-15 DIAGNOSIS — E103513 Type 1 diabetes mellitus with proliferative diabetic retinopathy with macular edema, bilateral: Secondary | ICD-10-CM | POA: Diagnosis not present

## 2019-02-15 DIAGNOSIS — H35033 Hypertensive retinopathy, bilateral: Secondary | ICD-10-CM

## 2019-02-15 DIAGNOSIS — I10 Essential (primary) hypertension: Secondary | ICD-10-CM | POA: Diagnosis not present

## 2019-03-11 DIAGNOSIS — R972 Elevated prostate specific antigen [PSA]: Secondary | ICD-10-CM | POA: Diagnosis not present

## 2019-03-11 DIAGNOSIS — I10 Essential (primary) hypertension: Secondary | ICD-10-CM | POA: Diagnosis not present

## 2019-03-11 DIAGNOSIS — E785 Hyperlipidemia, unspecified: Secondary | ICD-10-CM | POA: Diagnosis not present

## 2019-03-18 DIAGNOSIS — R972 Elevated prostate specific antigen [PSA]: Secondary | ICD-10-CM | POA: Diagnosis not present

## 2019-03-18 DIAGNOSIS — E785 Hyperlipidemia, unspecified: Secondary | ICD-10-CM | POA: Diagnosis not present

## 2019-03-18 DIAGNOSIS — I10 Essential (primary) hypertension: Secondary | ICD-10-CM | POA: Diagnosis not present

## 2019-03-18 DIAGNOSIS — E103513 Type 1 diabetes mellitus with proliferative diabetic retinopathy with macular edema, bilateral: Secondary | ICD-10-CM | POA: Diagnosis not present

## 2019-03-21 DIAGNOSIS — Z1159 Encounter for screening for other viral diseases: Secondary | ICD-10-CM | POA: Diagnosis not present

## 2019-03-21 DIAGNOSIS — F17210 Nicotine dependence, cigarettes, uncomplicated: Secondary | ICD-10-CM | POA: Diagnosis not present

## 2019-03-26 ENCOUNTER — Encounter (INDEPENDENT_AMBULATORY_CARE_PROVIDER_SITE_OTHER): Payer: Medicare Other | Admitting: Ophthalmology

## 2019-03-29 ENCOUNTER — Encounter (INDEPENDENT_AMBULATORY_CARE_PROVIDER_SITE_OTHER): Payer: Medicare Other | Admitting: Ophthalmology

## 2019-03-29 ENCOUNTER — Other Ambulatory Visit: Payer: Self-pay

## 2019-03-29 DIAGNOSIS — H43813 Vitreous degeneration, bilateral: Secondary | ICD-10-CM

## 2019-03-29 DIAGNOSIS — I10 Essential (primary) hypertension: Secondary | ICD-10-CM | POA: Diagnosis not present

## 2019-03-29 DIAGNOSIS — H35033 Hypertensive retinopathy, bilateral: Secondary | ICD-10-CM

## 2019-03-29 DIAGNOSIS — E10311 Type 1 diabetes mellitus with unspecified diabetic retinopathy with macular edema: Secondary | ICD-10-CM

## 2019-03-29 DIAGNOSIS — E103513 Type 1 diabetes mellitus with proliferative diabetic retinopathy with macular edema, bilateral: Secondary | ICD-10-CM

## 2019-04-08 ENCOUNTER — Encounter (INDEPENDENT_AMBULATORY_CARE_PROVIDER_SITE_OTHER): Payer: Medicare Other | Admitting: Ophthalmology

## 2019-05-03 ENCOUNTER — Encounter (INDEPENDENT_AMBULATORY_CARE_PROVIDER_SITE_OTHER): Payer: Medicare Other | Admitting: Ophthalmology

## 2019-05-03 DIAGNOSIS — I10 Essential (primary) hypertension: Secondary | ICD-10-CM

## 2019-05-03 DIAGNOSIS — E10311 Type 1 diabetes mellitus with unspecified diabetic retinopathy with macular edema: Secondary | ICD-10-CM

## 2019-05-03 DIAGNOSIS — H35033 Hypertensive retinopathy, bilateral: Secondary | ICD-10-CM

## 2019-05-03 DIAGNOSIS — H43813 Vitreous degeneration, bilateral: Secondary | ICD-10-CM

## 2019-05-03 DIAGNOSIS — E103513 Type 1 diabetes mellitus with proliferative diabetic retinopathy with macular edema, bilateral: Secondary | ICD-10-CM

## 2019-05-06 DIAGNOSIS — H401131 Primary open-angle glaucoma, bilateral, mild stage: Secondary | ICD-10-CM | POA: Diagnosis not present

## 2019-05-06 DIAGNOSIS — H1131 Conjunctival hemorrhage, right eye: Secondary | ICD-10-CM | POA: Diagnosis not present

## 2019-05-06 DIAGNOSIS — H20041 Secondary noninfectious iridocyclitis, right eye: Secondary | ICD-10-CM | POA: Diagnosis not present

## 2019-05-14 DIAGNOSIS — E1065 Type 1 diabetes mellitus with hyperglycemia: Secondary | ICD-10-CM | POA: Diagnosis not present

## 2019-05-14 DIAGNOSIS — E10319 Type 1 diabetes mellitus with unspecified diabetic retinopathy without macular edema: Secondary | ICD-10-CM | POA: Diagnosis not present

## 2019-05-14 DIAGNOSIS — Z79899 Other long term (current) drug therapy: Secondary | ICD-10-CM | POA: Diagnosis not present

## 2019-05-14 DIAGNOSIS — E10649 Type 1 diabetes mellitus with hypoglycemia without coma: Secondary | ICD-10-CM | POA: Diagnosis not present

## 2019-05-14 DIAGNOSIS — E1042 Type 1 diabetes mellitus with diabetic polyneuropathy: Secondary | ICD-10-CM | POA: Diagnosis not present

## 2019-05-14 DIAGNOSIS — I10 Essential (primary) hypertension: Secondary | ICD-10-CM | POA: Diagnosis not present

## 2019-05-14 DIAGNOSIS — E7800 Pure hypercholesterolemia, unspecified: Secondary | ICD-10-CM | POA: Diagnosis not present

## 2019-05-14 DIAGNOSIS — Z794 Long term (current) use of insulin: Secondary | ICD-10-CM | POA: Diagnosis not present

## 2019-06-10 DIAGNOSIS — E785 Hyperlipidemia, unspecified: Secondary | ICD-10-CM | POA: Diagnosis not present

## 2019-06-10 DIAGNOSIS — I10 Essential (primary) hypertension: Secondary | ICD-10-CM | POA: Diagnosis not present

## 2019-06-17 ENCOUNTER — Other Ambulatory Visit: Payer: Self-pay

## 2019-06-17 ENCOUNTER — Encounter (INDEPENDENT_AMBULATORY_CARE_PROVIDER_SITE_OTHER): Payer: Medicare Other | Admitting: Ophthalmology

## 2019-06-17 DIAGNOSIS — E785 Hyperlipidemia, unspecified: Secondary | ICD-10-CM | POA: Diagnosis not present

## 2019-06-17 DIAGNOSIS — H35033 Hypertensive retinopathy, bilateral: Secondary | ICD-10-CM

## 2019-06-17 DIAGNOSIS — E113513 Type 2 diabetes mellitus with proliferative diabetic retinopathy with macular edema, bilateral: Secondary | ICD-10-CM

## 2019-06-17 DIAGNOSIS — I10 Essential (primary) hypertension: Secondary | ICD-10-CM | POA: Diagnosis not present

## 2019-06-17 DIAGNOSIS — E103513 Type 1 diabetes mellitus with proliferative diabetic retinopathy with macular edema, bilateral: Secondary | ICD-10-CM | POA: Diagnosis not present

## 2019-06-17 DIAGNOSIS — E11311 Type 2 diabetes mellitus with unspecified diabetic retinopathy with macular edema: Secondary | ICD-10-CM | POA: Diagnosis not present

## 2019-06-17 DIAGNOSIS — Z794 Long term (current) use of insulin: Secondary | ICD-10-CM | POA: Diagnosis not present

## 2019-06-17 DIAGNOSIS — H43813 Vitreous degeneration, bilateral: Secondary | ICD-10-CM

## 2019-07-22 ENCOUNTER — Encounter (INDEPENDENT_AMBULATORY_CARE_PROVIDER_SITE_OTHER): Payer: Medicare Other | Admitting: Ophthalmology

## 2019-07-22 DIAGNOSIS — H43813 Vitreous degeneration, bilateral: Secondary | ICD-10-CM

## 2019-07-22 DIAGNOSIS — H35033 Hypertensive retinopathy, bilateral: Secondary | ICD-10-CM | POA: Diagnosis not present

## 2019-07-22 DIAGNOSIS — E11311 Type 2 diabetes mellitus with unspecified diabetic retinopathy with macular edema: Secondary | ICD-10-CM | POA: Diagnosis not present

## 2019-07-22 DIAGNOSIS — I10 Essential (primary) hypertension: Secondary | ICD-10-CM

## 2019-07-22 DIAGNOSIS — E113513 Type 2 diabetes mellitus with proliferative diabetic retinopathy with macular edema, bilateral: Secondary | ICD-10-CM

## 2019-09-04 ENCOUNTER — Other Ambulatory Visit: Payer: Self-pay

## 2019-09-04 ENCOUNTER — Encounter (INDEPENDENT_AMBULATORY_CARE_PROVIDER_SITE_OTHER): Payer: Medicare Other | Admitting: Ophthalmology

## 2019-09-04 DIAGNOSIS — I10 Essential (primary) hypertension: Secondary | ICD-10-CM | POA: Diagnosis not present

## 2019-09-04 DIAGNOSIS — H35033 Hypertensive retinopathy, bilateral: Secondary | ICD-10-CM

## 2019-09-04 DIAGNOSIS — E10311 Type 1 diabetes mellitus with unspecified diabetic retinopathy with macular edema: Secondary | ICD-10-CM | POA: Diagnosis not present

## 2019-09-04 DIAGNOSIS — E103513 Type 1 diabetes mellitus with proliferative diabetic retinopathy with macular edema, bilateral: Secondary | ICD-10-CM | POA: Diagnosis not present

## 2019-09-04 DIAGNOSIS — H43813 Vitreous degeneration, bilateral: Secondary | ICD-10-CM

## 2019-09-18 DIAGNOSIS — E119 Type 2 diabetes mellitus without complications: Secondary | ICD-10-CM | POA: Diagnosis not present

## 2019-09-18 DIAGNOSIS — E785 Hyperlipidemia, unspecified: Secondary | ICD-10-CM | POA: Diagnosis not present

## 2019-10-16 ENCOUNTER — Encounter (INDEPENDENT_AMBULATORY_CARE_PROVIDER_SITE_OTHER): Payer: Medicare Other | Admitting: Ophthalmology

## 2019-10-16 ENCOUNTER — Other Ambulatory Visit: Payer: Self-pay

## 2019-10-16 DIAGNOSIS — E103513 Type 1 diabetes mellitus with proliferative diabetic retinopathy with macular edema, bilateral: Secondary | ICD-10-CM | POA: Diagnosis not present

## 2019-10-16 DIAGNOSIS — I10 Essential (primary) hypertension: Secondary | ICD-10-CM | POA: Diagnosis not present

## 2019-10-16 DIAGNOSIS — H35033 Hypertensive retinopathy, bilateral: Secondary | ICD-10-CM

## 2019-10-16 DIAGNOSIS — H43813 Vitreous degeneration, bilateral: Secondary | ICD-10-CM

## 2019-10-16 DIAGNOSIS — E10311 Type 1 diabetes mellitus with unspecified diabetic retinopathy with macular edema: Secondary | ICD-10-CM | POA: Diagnosis not present

## 2019-11-07 DIAGNOSIS — E113513 Type 2 diabetes mellitus with proliferative diabetic retinopathy with macular edema, bilateral: Secondary | ICD-10-CM | POA: Diagnosis not present

## 2019-11-07 DIAGNOSIS — H401131 Primary open-angle glaucoma, bilateral, mild stage: Secondary | ICD-10-CM | POA: Diagnosis not present

## 2019-11-07 DIAGNOSIS — Z961 Presence of intraocular lens: Secondary | ICD-10-CM | POA: Diagnosis not present

## 2019-11-07 DIAGNOSIS — H26492 Other secondary cataract, left eye: Secondary | ICD-10-CM | POA: Diagnosis not present

## 2019-11-19 DIAGNOSIS — E7800 Pure hypercholesterolemia, unspecified: Secondary | ICD-10-CM | POA: Diagnosis not present

## 2019-11-19 DIAGNOSIS — I10 Essential (primary) hypertension: Secondary | ICD-10-CM | POA: Diagnosis not present

## 2019-11-19 DIAGNOSIS — Z79899 Other long term (current) drug therapy: Secondary | ICD-10-CM | POA: Diagnosis not present

## 2019-11-19 DIAGNOSIS — E1042 Type 1 diabetes mellitus with diabetic polyneuropathy: Secondary | ICD-10-CM | POA: Diagnosis not present

## 2019-11-19 DIAGNOSIS — E1065 Type 1 diabetes mellitus with hyperglycemia: Secondary | ICD-10-CM | POA: Diagnosis not present

## 2019-11-19 DIAGNOSIS — E10649 Type 1 diabetes mellitus with hypoglycemia without coma: Secondary | ICD-10-CM | POA: Diagnosis not present

## 2019-11-19 DIAGNOSIS — E10319 Type 1 diabetes mellitus with unspecified diabetic retinopathy without macular edema: Secondary | ICD-10-CM | POA: Diagnosis not present

## 2019-11-27 ENCOUNTER — Other Ambulatory Visit: Payer: Self-pay

## 2019-11-27 ENCOUNTER — Encounter (INDEPENDENT_AMBULATORY_CARE_PROVIDER_SITE_OTHER): Payer: Medicare Other | Admitting: Ophthalmology

## 2019-11-27 DIAGNOSIS — H43813 Vitreous degeneration, bilateral: Secondary | ICD-10-CM

## 2019-11-27 DIAGNOSIS — E103513 Type 1 diabetes mellitus with proliferative diabetic retinopathy with macular edema, bilateral: Secondary | ICD-10-CM | POA: Diagnosis not present

## 2019-11-27 DIAGNOSIS — I10 Essential (primary) hypertension: Secondary | ICD-10-CM

## 2019-11-27 DIAGNOSIS — E10311 Type 1 diabetes mellitus with unspecified diabetic retinopathy with macular edema: Secondary | ICD-10-CM

## 2019-11-27 DIAGNOSIS — H35033 Hypertensive retinopathy, bilateral: Secondary | ICD-10-CM | POA: Diagnosis not present

## 2020-01-01 DIAGNOSIS — I10 Essential (primary) hypertension: Secondary | ICD-10-CM | POA: Diagnosis not present

## 2020-01-01 DIAGNOSIS — Z125 Encounter for screening for malignant neoplasm of prostate: Secondary | ICD-10-CM | POA: Diagnosis not present

## 2020-01-01 DIAGNOSIS — E785 Hyperlipidemia, unspecified: Secondary | ICD-10-CM | POA: Diagnosis not present

## 2020-01-01 DIAGNOSIS — E103513 Type 1 diabetes mellitus with proliferative diabetic retinopathy with macular edema, bilateral: Secondary | ICD-10-CM | POA: Diagnosis not present

## 2020-01-07 ENCOUNTER — Other Ambulatory Visit: Payer: Self-pay

## 2020-01-07 ENCOUNTER — Encounter (INDEPENDENT_AMBULATORY_CARE_PROVIDER_SITE_OTHER): Payer: Medicare Other | Admitting: Ophthalmology

## 2020-01-07 DIAGNOSIS — H43813 Vitreous degeneration, bilateral: Secondary | ICD-10-CM

## 2020-01-07 DIAGNOSIS — I10 Essential (primary) hypertension: Secondary | ICD-10-CM | POA: Diagnosis not present

## 2020-01-07 DIAGNOSIS — H35033 Hypertensive retinopathy, bilateral: Secondary | ICD-10-CM | POA: Diagnosis not present

## 2020-01-07 DIAGNOSIS — E10311 Type 1 diabetes mellitus with unspecified diabetic retinopathy with macular edema: Secondary | ICD-10-CM | POA: Diagnosis not present

## 2020-01-07 DIAGNOSIS — E103513 Type 1 diabetes mellitus with proliferative diabetic retinopathy with macular edema, bilateral: Secondary | ICD-10-CM

## 2020-01-08 DIAGNOSIS — R972 Elevated prostate specific antigen [PSA]: Secondary | ICD-10-CM | POA: Diagnosis not present

## 2020-01-08 DIAGNOSIS — I10 Essential (primary) hypertension: Secondary | ICD-10-CM | POA: Diagnosis not present

## 2020-01-08 DIAGNOSIS — Z79899 Other long term (current) drug therapy: Secondary | ICD-10-CM | POA: Diagnosis not present

## 2020-01-08 DIAGNOSIS — E785 Hyperlipidemia, unspecified: Secondary | ICD-10-CM | POA: Diagnosis not present

## 2020-02-13 ENCOUNTER — Ambulatory Visit: Payer: Medicare Other | Attending: Internal Medicine

## 2020-02-13 DIAGNOSIS — Z23 Encounter for immunization: Secondary | ICD-10-CM

## 2020-02-13 NOTE — Progress Notes (Signed)
   [id number]  Name:  Douglas [name].    MRN: [id number] DOB: 1950-05-14  02/13/2020  [name] was observed post Covid-19 immunization for 15 minutes without incident. He was provided with Vaccine Information Sheet and instruction to access the V-Safe system.   [name] was instructed to call 911 with any severe reactions post vaccine: Marland Kitchen Difficulty breathing  . Swelling of face and throat  . A fast heartbeat  . A bad rash all over body  . Dizziness and weakness

## 2020-02-18 ENCOUNTER — Other Ambulatory Visit: Payer: Self-pay

## 2020-02-18 ENCOUNTER — Encounter (INDEPENDENT_AMBULATORY_CARE_PROVIDER_SITE_OTHER): Payer: Medicare Other | Admitting: Ophthalmology

## 2020-02-18 DIAGNOSIS — E10311 Type 1 diabetes mellitus with unspecified diabetic retinopathy with macular edema: Secondary | ICD-10-CM | POA: Diagnosis not present

## 2020-02-18 DIAGNOSIS — H43813 Vitreous degeneration, bilateral: Secondary | ICD-10-CM

## 2020-02-18 DIAGNOSIS — I10 Essential (primary) hypertension: Secondary | ICD-10-CM | POA: Diagnosis not present

## 2020-02-18 DIAGNOSIS — H35033 Hypertensive retinopathy, bilateral: Secondary | ICD-10-CM | POA: Diagnosis not present

## 2020-02-18 DIAGNOSIS — E103513 Type 1 diabetes mellitus with proliferative diabetic retinopathy with macular edema, bilateral: Secondary | ICD-10-CM | POA: Diagnosis not present

## 2020-03-05 ENCOUNTER — Encounter: Payer: Self-pay | Admitting: Urology

## 2020-03-05 ENCOUNTER — Other Ambulatory Visit: Payer: Self-pay

## 2020-03-05 ENCOUNTER — Ambulatory Visit (INDEPENDENT_AMBULATORY_CARE_PROVIDER_SITE_OTHER): Payer: Medicare Other | Admitting: Urology

## 2020-03-05 VITALS — BP 129/79 | HR 93 | Temp 99.3°F | Wt 185.0 lb

## 2020-03-05 DIAGNOSIS — R972 Elevated prostate specific antigen [PSA]: Secondary | ICD-10-CM | POA: Diagnosis not present

## 2020-03-05 LAB — URINALYSIS, ROUTINE W REFLEX MICROSCOPIC
Bilirubin, UA: NEGATIVE
Glucose, UA: NEGATIVE
Ketones, UA: NEGATIVE
Leukocytes,UA: NEGATIVE
Nitrite, UA: NEGATIVE
Protein,UA: NEGATIVE
RBC, UA: NEGATIVE
Specific Gravity, UA: 1.015 (ref 1.005–1.030)
Urobilinogen, Ur: 0.2 mg/dL (ref 0.2–1.0)
pH, UA: 7 (ref 5.0–7.5)

## 2020-03-05 MED ORDER — LEVOFLOXACIN 750 MG PO TABS: 750.0000 mg | ORAL_TABLET | Freq: Once | ORAL | 0 refills | Status: AC

## 2020-03-05 NOTE — Progress Notes (Signed)
03/05/2020 1:49 PM   Douglas [name]. 1950/08/25 [id number]  Referring provider: Sharilyn Sites, MD [address] [place],  [place] [address]  Elevated PSA  HPI: [name] is a [phone] here for evaluation of elevated PSA. PSA was 8.2-8.7 in 2020. No significant LUTS. No hematuria or dysuria. No family hx of prostate cancer.  No issues with ED   PMH: Past Medical History:  Diagnosis Date  . COPD (chronic obstructive pulmonary disease) ([place])   . Diabetes mellitus without complication ([place])   . Hypercholesteremia   . Hypertension     Surgical History: Past Surgical History:  Procedure Laterality Date  . COLONOSCOPY N/A 04/05/2013   Procedure: COLONOSCOPY;  Surgeon: Danie Binder, MD;  Location[facility];  Service: Endoscopy;  Laterality: N/A;  8:30 AM  . NO PAST SURGERIES      Home Medications:  Allergies as of 03/05/2020   No Known Allergies     Medication List       Accurate as of March 05, 2020  1:49 PM. If you have any questions, ask your nurse or doctor.        STOP taking these medications   HumuLIN N 100 UNIT/ML injection Generic drug: insulin NPH Human Stopped by: [name], MD   insulin pump Soln Stopped by: [name], MD   PROzac 20 MG capsule Generic drug: FLUoxetine Stopped by: [name], MD     TAKE these medications   atorvastatin 40 MG tablet Commonly known as: LIPITOR Take 40 mg by mouth daily.   brimonidine 0.2 % ophthalmic solution Commonly known as: ALPHAGAN Place 1 drop into the left eye 2 (two) times daily.   dorzolamide 2 % ophthalmic solution Commonly known as: TRUSOPT   enalapril 5 MG tablet Commonly known as: VASOTEC Take by mouth. What changed: Another medication with the same name was removed. Continue taking this medication, and follow the directions you see here. Changed by: [name], MD   fluticasone 50 MCG/ACT nasal spray Commonly known as: FLONASE Place 2 sprays into both  nostrils daily.   HumaLOG KwikPen 100 UNIT/ML KwikPen Generic drug: insulin lispro USE BEFORE MEALS INJECT SUBCUTANEOUSLY UP TO 65 UNITS DAILY.   hydrochlorothiazide 25 MG tablet Commonly known as: HYDRODIURIL Take 25 mg by mouth daily.   Lantus SoloStar 100 UNIT/ML Solostar Pen Generic drug: insulin glargine Inject into the skin.   timolol 0.25 % ophthalmic solution Commonly known as: TIMOPTIC       Allergies: No Known Allergies  Family History: Family History  Problem Relation Age of Onset  . Colon cancer Neg Hx     Social History:  reports that he has quit smoking. His smoking use included cigarettes. He has a 70.00 pack-year smoking history. He has never used smokeless tobacco. He reports that he does not drink alcohol and does not use drugs.  ROS: All other review of systems were reviewed and are negative except what is noted above in HPI  Physical Exam: BP 129/79   Pulse 93   Temp 99.3 F (37.4 C)   Wt 185 lb (83.9 kg)   BMI 27.32 kg/m   Constitutional:  Alert and oriented, No acute distress. HEENT: [place] AT, moist mucus membranes.  Trachea midline, no masses. Cardiovascular: No clubbing, cyanosis, or edema. Respiratory: Normal respiratory effort, no increased work of breathing. GI: Abdomen is soft, nontender, nondistended, no abdominal masses GU: No CVA tenderness. Circumcised phallus. No masses/lesions on penis, testis, scrotum. Prostate 60g smooth  no nodules no induration.  Lymph: No cervical or inguinal lymphadenopathy. Skin: No rashes, bruises or suspicious lesions. Neurologic: Grossly intact, no focal deficits, moving all 4 extremities. Psychiatric: Normal mood and affect.  Laboratory Data: Lab Results  Component Value Date   WBC 11.2 (H) 08/14/2018   HGB 15.8 08/14/2018   HCT 45.6 08/14/2018   MCV 94.0 08/14/2018   PLT 295 08/14/2018    Lab Results  Component Value Date   CREATININE 0.97 08/14/2018    No results found for: PSA  No results  found for: TESTOSTERONE  No results found for: HGBA1C  Urinalysis    Component Value Date/Time   COLORURINE YELLOW 08/14/2018 2159   APPEARANCEUR Clear 03/05/2020 1311   LABSPEC 1.013 08/14/2018 2159   PHURINE 5.0 08/14/2018 2159   GLUCOSEU Negative 03/05/2020 1311   HGBUR NEGATIVE 08/14/2018 2159   BILIRUBINUR Negative 03/05/2020 1311   KETONESUR 20 (A) 08/14/2018 2159   PROTEINUR Negative 03/05/2020 1311   PROTEINUR NEGATIVE 08/14/2018 2159   NITRITE Negative 03/05/2020 1311   NITRITE NEGATIVE 08/14/2018 2159   LEUKOCYTESUR Negative 03/05/2020 1311   LEUKOCYTESUR NEGATIVE 08/14/2018 2159    Lab Results  Component Value Date   LABMICR Comment 03/05/2020    Pertinent Imaging:  No results found for this or any previous visit.  No results found for this or any previous visit.  No results found for this or any previous visit.  No results found for this or any previous visit.  No results found for this or any previous visit.  No results found for this or any previous visit.  No results found for this or any previous visit.  No results found for this or any previous visit.   Assessment & Plan:    1. Elevated PSA The patient and I talked about etiologies of elevated PSA.  We discussed the possible relationship between elevated PSA, prostate cancer, BPH, prostatitis, and UTI.   Conservative treatment of elevated PSA with watchful waiting was discussed with the patient.  All questions were answered.        All of the risks and benefits along with alternatives to prostate biopsy were discussed with the patient.  The patient gave fully informed consent to proceed with a transrectal ultrasound guided biopsy of the prostate for the evaluation of their evated PSA.  Prostate biopsy instructions and antibiotics were given to the patient.  - Urinalysis, Routine w reflex microscopic   No follow-ups on file.  [name], MD  [facility] [place]

## 2020-03-05 NOTE — Progress Notes (Signed)
Urological Symptom Review  Patient is experiencing the following symptoms: Erection problem   Review of Systems  Gastrointestinal (upper)  : Negative for upper GI symptoms  Gastrointestinal (lower) : Negative for lower GI symptoms  Constitutional : Negative for symptoms  Skin: Negative for skin symptoms  Eyes: Negative for eye symptoms  Ear/Nose/Throat : Negative for Ear/Nose/Throat symptoms  Hematologic/Lymphatic: Negative for Hematologic/Lymphatic symptoms  Cardiovascular : Negative for cardiovascular symptoms  Respiratory : Cough Shortness of breath  Endocrine: Negative for endocrine symptoms  Musculoskeletal: Negative for musculoskeletal symptoms  Neurological: Negative for neurological symptoms  Psychologic: Negative for psychiatric symptoms

## 2020-03-05 NOTE — Patient Instructions (Addendum)
Appointment Time: 12pm arrival Appointment Date: 03/19/2020  Location: [facility] Radiology Department   Prostate Biopsy Instructions  Stop all aspirin or blood thinners (aspirin, plavix, coumadin, warfarin, motrin, ibuprofen, advil, aleve, naproxen, naprosyn) for 7 days prior to the procedure.  If you have any questions about stopping these medications, please contact your primary care physician or cardiologist.  Having a light meal prior to the procedure is recommended.  If you are diabetic or have low blood sugar please bring a small snack or glucose tablet.  A Fleets enema is needed to be purchased over the counter at a local pharmacy and used 2 hours before you scheduled appointment.  This can be purchased over the counter at any pharmacy.  Antibiotics will be administered in the clinic at the time of the procedure and 1 tablet has been sent to your pharmacy. Please take the antibiotic as prescribed.    Please bring someone with you to the procedure to drive you home if you are given a valium to take prior to your procedure.   If you have any questions or concerns, please feel free to call the office at [phone] or send a Mychart message.    Thank you, [facility]   Transrectal Ultrasound-Guided Prostate Biopsy This is a procedure to take samples of tissue from your prostate. Ultrasound images are used to guide the procedure. It is usually done to check for prostate cancer. What happens before the procedure? Staying hydrated Follow instructions about liquids. These may include:  Up to 2 hours before the procedure - you may drink clear liquids. This includes water, clear fruit juice, black coffee, and plain tea.  Eating and drinking restrictions Follow instructions about eating and drinking. These may include:  8 hours before the procedure - stop eating heavy meals or foods. This includes meat, fried foods, or fatty foods.  6 hours before the procedure -  stop eating light meals or foods. This includes toast or cereal.  6 hours before the procedure - stop drinking milk or drinks that have milk.  2 hours before the procedure - stop drinking clear liquids. Medicines Ask your doctor about:  Changing or stopping your normal medicines. This is important if you take diabetes medicines or blood thinners.  Taking over-the-counter medicines, vitamins, herbs, and supplements.  Taking medicines such as aspirin and ibuprofen. These medicines can thin your blood. Do not take these medicines unless your doctor tells you to take them. General instructions  You may be given antibiotic medicine. If so, take it as told by your doctor.  Liquid will be used to clear waste from your butt (enema).  You may have a blood sample taken.  You may have a pee (urine) sample taken.  Plan to have someone take you home after your procedure. What happens during the procedure?   To lower your risk of infection: ? Your health care team will wash or sanitize their hands. ? Hair may be removed from the area. ? Your skin will be cleaned with soap. ? You will be given antibiotics.  An IV will be placed into one of your veins.  You will be given one or both of these: ? A medicine to help you relax. ? A medicine to numb the area.  You will lie on your left side. Your knees will be bent.  A probe with gel on it will be placed in your butt. Pictures will be taken of your prostate and the  area around it.  Medicine will be used to numb your prostate.  A needle will be placed in your butt and moved to your prostate.  Prostate tissue will be removed.  The samples will be sent to a lab. The procedure may vary. What happens after the procedure?  You will be watched until the medicines you were given have worn off.  You may have some pain in your butt. You will be given medicine for it. Summary  This procedure is usually done to check for prostate  cancer.  Before the procedure, ask your doctor about changing or stopping your medicines.  You may have some pain in your butt. You will be given medicine for it.  Plan to have someone take you home after the procedure. This information is not intended to replace advice given to you by your health care provider. Make sure you discuss any questions you have with your health care provider. Document Revised: 07/25/2018 Document Reviewed: 07/01/2016 Elsevier Patient Education  [place].

## 2020-03-19 ENCOUNTER — Other Ambulatory Visit: Payer: Self-pay | Admitting: Urology

## 2020-03-19 ENCOUNTER — Encounter (HOSPITAL_COMMUNITY): Payer: Self-pay

## 2020-03-19 ENCOUNTER — Other Ambulatory Visit: Payer: Self-pay

## 2020-03-19 ENCOUNTER — Encounter: Payer: Self-pay | Admitting: Urology

## 2020-03-19 ENCOUNTER — Ambulatory Visit (HOSPITAL_BASED_OUTPATIENT_CLINIC_OR_DEPARTMENT_OTHER): Payer: Medicare Other | Admitting: Urology

## 2020-03-19 ENCOUNTER — Ambulatory Visit (HOSPITAL_COMMUNITY)
Admission: RE | Admit: 2020-03-19 | Discharge: 2020-03-19 | Disposition: A | Discharge status: Home / Self Care | Payer: Medicare Other | Source: Ambulatory Visit | Attending: Urology | Admitting: Urology

## 2020-03-19 DIAGNOSIS — R972 Elevated prostate specific antigen [PSA]: Secondary | ICD-10-CM | POA: Diagnosis not present

## 2020-03-19 MED ORDER — GENTAMICIN SULFATE 40 MG/ML IJ SOLN: 80.0000 mg | Freq: Once | INTRAMUSCULAR | Status: AC

## 2020-03-19 MED ORDER — GENTAMICIN SULFATE 40 MG/ML IJ SOLN
INTRAMUSCULAR | Status: AC
  Administered 2020-03-19: 80 mg via INTRAMUSCULAR
  Filled 2020-03-19: qty 2

## 2020-03-19 MED ORDER — LIDOCAINE HCL (PF) 2 % IJ SOLN: 10.0000 mL | Freq: Once | INTRAMUSCULAR | Status: AC

## 2020-03-19 MED ORDER — LIDOCAINE HCL (PF) 2 % IJ SOLN
INTRAMUSCULAR | Status: AC
  Administered 2020-03-19: 10 mL
  Filled 2020-03-19: qty 10

## 2020-03-19 NOTE — Patient Instructions (Signed)

## 2020-03-19 NOTE — Sedation Documentation (Signed)
PT tolerated prostate biopsy procedure well today. Labs obtained and sent for pathology. PT ambulatory at discharge with no acute distress noted and verbalized understanding of discharge instructions. PT to follow up with urologist as scheduled on 03/25/20.

## 2020-03-19 NOTE — Progress Notes (Signed)
Prostate Biopsy Procedure   Informed consent was obtained after discussing risks/benefits of the procedure.  A time out was performed to ensure correct patient identity.  Pre-Procedure: - Last PSA Level: No results found for: PSA - Gentamicin given prophylactically - Levaquin 500 mg administered PO -Transrectal Ultrasound performed revealing a 46.2 gm prostate -No significant hypoechoic or median lobe noted  Procedure: - Prostate block performed using 10 cc 1% lidocaine and biopsies taken from sextant areas, a total of 12 under ultrasound guidance.  Post-Procedure: - Patient tolerated the procedure well - He was counseled to seek immediate medical attention if experiences any severe pain, significant bleeding, or fevers - Return in one week to discuss biopsy results

## 2020-03-19 NOTE — Discharge Instructions (Signed)

## 2020-03-25 ENCOUNTER — Ambulatory Visit (INDEPENDENT_AMBULATORY_CARE_PROVIDER_SITE_OTHER): Payer: Medicare Other | Admitting: Urology

## 2020-03-25 ENCOUNTER — Other Ambulatory Visit: Payer: Self-pay

## 2020-03-25 VITALS — BP 152/88 | HR 80 | Temp 99.3°F | Ht 69.0 in | Wt 185.0 lb

## 2020-03-25 DIAGNOSIS — C61 Malignant neoplasm of prostate: Secondary | ICD-10-CM

## 2020-03-25 NOTE — Progress Notes (Signed)
03/25/2020 5:00 PM   Rip Harbour. 04-Jun-1950 [id number]  Referring provider: No referring provider defined for this encounter.  Followup prostate biopsy  HPI: [name] is a 69yo here for followup after prostate biopsy. Biopsy revealed Gleason 3+4=7 in 2/12 cores. 70-80% of core. Mild LUTS. Mild ED   PMH: Past Medical History:  Diagnosis Date  . COPD (chronic obstructive pulmonary disease) ([place])   . Diabetes mellitus without complication ([place])   . Hypercholesteremia   . Hypertension     Surgical History: Past Surgical History:  Procedure Laterality Date  . COLONOSCOPY N/A 04/05/2013   Procedure: COLONOSCOPY;  Surgeon: Danie Binder, MD;  [facility];  Service: Endoscopy;  Laterality: N/A;  8:30 AM  . NO PAST SURGERIES      Home Medications:  Allergies as of 03/25/2020   No Known Allergies     Medication List       Accurate as of March 25, 2020  5:00 PM. If you have any questions, ask your nurse or doctor.        atorvastatin 40 MG tablet Commonly known as: LIPITOR Take 40 mg by mouth daily.   brimonidine 0.2 % ophthalmic solution Commonly known as: ALPHAGAN Place 1 drop into the left eye 2 (two) times daily.   dorzolamide 2 % ophthalmic solution Commonly known as: TRUSOPT   enalapril 5 MG tablet Commonly known as: VASOTEC Take by mouth.   fluticasone 50 MCG/ACT nasal spray Commonly known as: FLONASE Place 2 sprays into both nostrils daily.   HumaLOG KwikPen 100 UNIT/ML KwikPen Generic drug: insulin lispro USE BEFORE MEALS INJECT SUBCUTANEOUSLY UP TO 65 UNITS DAILY.   hydrochlorothiazide 25 MG tablet Commonly known as: HYDRODIURIL Take 25 mg by mouth daily.   Lantus SoloStar 100 UNIT/ML Solostar Pen Generic drug: insulin glargine Inject into the skin.   timolol 0.25 % ophthalmic solution Commonly known as: TIMOPTIC       Allergies: No Known Allergies  Family History: Family History  Problem Relation Age of Onset   . Colon cancer Neg Hx     Social History:  reports that he has quit smoking. His smoking use included cigarettes. He has a 70.00 pack-year smoking history. He has never used smokeless tobacco. He reports that he does not drink alcohol and does not use drugs.  ROS: All other review of systems were reviewed and are negative except what is noted above in HPI  Physical Exam: BP (!) 152/88   Pulse 80   Temp 99.3 F (37.4 C)   Ht 5\' 9"  (1.753 m)   Wt 185 lb (83.9 kg)   BMI 27.32 kg/m   Constitutional:  Alert and oriented, No acute distress. HEENT: [place] AT, moist mucus membranes.  Trachea midline, no masses. Cardiovascular: No clubbing, cyanosis, or edema. Respiratory: Normal respiratory effort, no increased work of breathing. GI: Abdomen is soft, nontender, nondistended, no abdominal masses GU: No CVA tenderness.  Lymph: No cervical or inguinal lymphadenopathy. Skin: No rashes, bruises or suspicious lesions. Neurologic: Grossly intact, no focal deficits, moving all 4 extremities. Psychiatric: Normal mood and affect.  Laboratory Data: Lab Results  Component Value Date   WBC 11.2 (H) 08/14/2018   HGB 15.8 08/14/2018   HCT 45.6 08/14/2018   MCV 94.0 08/14/2018   PLT 295 08/14/2018    Lab Results  Component Value Date   CREATININE 0.97 08/14/2018    No results found for: PSA  No results found for: TESTOSTERONE  No  results found for: HGBA1C  Urinalysis    Component Value Date/Time   COLORURINE YELLOW 08/14/2018 2159   APPEARANCEUR Clear 03/05/2020 1311   LABSPEC 1.013 08/14/2018 2159   PHURINE 5.0 08/14/2018 2159   GLUCOSEU Negative 03/05/2020 1311   HGBUR NEGATIVE 08/14/2018 2159   BILIRUBINUR Negative 03/05/2020 1311   KETONESUR 20 (A) 08/14/2018 2159   PROTEINUR Negative 03/05/2020 1311   PROTEINUR NEGATIVE 08/14/2018 2159   NITRITE Negative 03/05/2020 1311   NITRITE NEGATIVE 08/14/2018 2159   LEUKOCYTESUR Negative 03/05/2020 1311   LEUKOCYTESUR NEGATIVE  08/14/2018 2159    Lab Results  Component Value Date   LABMICR Comment 03/05/2020    Pertinent Imaging:  No results found for this or any previous visit.  No results found for this or any previous visit.  No results found for this or any previous visit.  No results found for this or any previous visit.  No results found for this or any previous visit.  No results found for this or any previous visit.  No results found for this or any previous visit.  No results found for this or any previous visit.   Assessment & Plan:    1. Elevate PSA I discussed the natural history of favorable intermediate risk prostate cancer with the patient and the various treatment options including active surveillance, RALP, IMRT, brachytherapy, cryotherapy, HIFU and ADT. The patient will meet with Dr. Tresa Moore to consider RALP and Dr. Tammi Klippel for consideration of IMRT/brachytherapy  No follow-ups on file.  [name], MD  [facility] [place]

## 2020-03-31 ENCOUNTER — Encounter: Payer: Self-pay | Admitting: Urology

## 2020-03-31 ENCOUNTER — Encounter (INDEPENDENT_AMBULATORY_CARE_PROVIDER_SITE_OTHER): Payer: Medicare Other | Admitting: Ophthalmology

## 2020-03-31 ENCOUNTER — Other Ambulatory Visit: Payer: Self-pay

## 2020-03-31 DIAGNOSIS — E103513 Type 1 diabetes mellitus with proliferative diabetic retinopathy with macular edema, bilateral: Secondary | ICD-10-CM

## 2020-03-31 DIAGNOSIS — H35033 Hypertensive retinopathy, bilateral: Secondary | ICD-10-CM | POA: Diagnosis not present

## 2020-03-31 DIAGNOSIS — E10311 Type 1 diabetes mellitus with unspecified diabetic retinopathy with macular edema: Secondary | ICD-10-CM | POA: Diagnosis not present

## 2020-03-31 DIAGNOSIS — I10 Essential (primary) hypertension: Secondary | ICD-10-CM | POA: Diagnosis not present

## 2020-03-31 DIAGNOSIS — H43813 Vitreous degeneration, bilateral: Secondary | ICD-10-CM

## 2020-03-31 NOTE — Patient Instructions (Signed)
Prostate Cancer  The prostate is a male gland that helps make semen. Prostate cancer is when abnormal cells grow in this gland. Follow these instructions at home:  Take over-the-counter and prescription medicines only as told by your doctor.  Eat a healthy diet.  Get plenty of sleep.  Ask your doctor for help to find a support group for men with prostate cancer.  Keep all follow-up visits as told by your doctor. This is important.  If you have to go to the hospital, let your cancer doctor (oncologist) know.  Touch, hold, hug, and caress your partner to continue to show sexual feelings. Contact a doctor if:  You have trouble peeing (urinating).  You have blood in your pee (urine).  You have pain in your hips, back, or chest. Get help right away if:  You have weakness in your legs.  You lose feeling (have numbness) in your legs.  You cannot control your pee or your poop (stool).  You have trouble breathing.  You have sudden pain in your chest.  You have chills or a fever. Summary  The prostate is a male gland that helps make semen. Prostate cancer is when abnormal cells grow in this gland.  Ask your doctor for help to find a support group for men with prostate cancer.  Contact a doctor if you have problems peeing or have any new pain that you did not have before. This information is not intended to replace advice given to you by your health care provider. Make sure you discuss any questions you have with your health care provider. Document Revised: 03/17/2017 Document Reviewed: 12/14/2015 Elsevier Patient Education  2020 Elsevier Inc.  

## 2020-05-05 ENCOUNTER — Ambulatory Visit: Payer: Medicare Other | Admitting: Radiation Oncology

## 2020-05-05 ENCOUNTER — Ambulatory Visit: Payer: Medicare Other

## 2020-05-05 DIAGNOSIS — C61 Malignant neoplasm of prostate: Secondary | ICD-10-CM | POA: Insufficient documentation

## 2020-05-05 NOTE — Progress Notes (Signed)
Radiation Oncology         [phone]) [phone] ________________________________  Initial outpatient Consultation  Name: Douglas [name]. MRN: [id number]  Date: 05/07/2020  DOB: [birth year]  CC: Sharilyn Sites, MD  Alyson Ingles Candee Furbish, MD   REFERRING PHYSICIAN: Cleon Gustin, MD  DIAGNOSIS: 70 y.o. gentleman with stage T1c adenocarcinoma of the prostate with a Gleason's score of 3+4 and a PSA of 8.7  Oncology History  Cancer of prostate with intermediate recurrence risk (stage T2b-c or Gleason 7 or PSA 10-20) (Douglas [name])  05/05/2020 Initial Diagnosis   Cancer of prostate with intermediate recurrence risk (stage T2b-c or Gleason 7 or PSA 10-20) (Douglas [name])   05/07/2020 Cancer Staging   Staging form: Prostate, AJCC 8th Edition - Clinical stage from 05/07/2020: Stage IIB (cT1c, cN0, cM0, PSA: 8.7, Grade Group: 2) - Signed by [name], MD on 05/07/2020       ICD-10-CM   1. Cancer of prostate with intermediate recurrence risk (stage T2b-c or Gleason 7 or PSA 10-20) (Douglas [name])  C61 Ambulatory referral to [place] [facility] [name]. is a 70 y.o. gentleman.  He was noted to have an elevated PSA of 8.7 by his primary care physician, Dr. Hilma Favors.  Accordingly, he was referred for evaluation in urology by Dr. Alyson Ingles on 03/05/20,  digital rectal examination was performed at that time revealing a 60 gm smooth prostate with no nodules.  The patient proceeded to transrectal ultrasound with 12 biopsies of the prostate on 03/19/20.  The prostate volume measured 46.2 cc.  Out of 12 core biopsies, 2 were positive.  The maximum Gleason score was 3+4, and this was seen in the left base and left lateral base:    The patient reviewed the biopsy results with his urologist and he has kindly been referred today for discussion of potential radiation treatment options.  He'll also see Dr. Tresa Moore Monday to discuss prostatectomy.  PREVIOUS RADIATION THERAPY: No  PAST MEDICAL HISTORY:  has a  past medical history of COPD (chronic obstructive pulmonary disease) ([place]), Diabetes mellitus without complication ([place]), Hypercholesteremia, Hypertension, and Prostate cancer ([place]).    PAST SURGICAL HISTORY: Past Surgical History:  Procedure Laterality Date   COLONOSCOPY N/A 04/05/2013   Procedure: COLONOSCOPY;  Surgeon: Douglas Binder, MD;  Location[facility];  Service: Endoscopy;  Laterality: N/A;  8:30 AM   NO PAST SURGERIES     PROSTATE BIOPSY      FAMILY HISTORY: family history includes Breast cancer in his mother.  SOCIAL HISTORY:  reports that he has been smoking cigarettes. He has a 35.00 pack-year smoking history. He has never used smokeless tobacco. He reports that he does not drink alcohol and does not use drugs.  ALLERGIES: Patient has no known allergies.  MEDICATIONS:  Current Outpatient Medications  Medication Sig Dispense Refill   atorvastatin (LIPITOR) 40 MG tablet Take 40 mg by mouth daily.     brimonidine (ALPHAGAN) 0.2 % ophthalmic solution Place 1 drop into the left eye 2 (two) times daily.      dorzolamide (TRUSOPT) 2 % ophthalmic solution      enalapril (VASOTEC) 10 MG tablet Take 10 mg by mouth daily.     hydrochlorothiazide (HYDRODIURIL) 25 MG tablet Take 25 mg by mouth daily.     insulin lispro (HUMALOG) 100 UNIT/ML KwikPen USE BEFORE MEALS INJECT SUBCUTANEOUSLY UP TO 65 UNITS DAILY.     LANTUS SOLOSTAR 100 UNIT/ML Solostar Pen Inject into the  skin.     sildenafil (VIAGRA) 50 MG tablet TAKE 1 TABLET BY MOUTH AS NEEDED LIMIT TO 1 DAILY.     timolol (TIMOPTIC) 0.25 % ophthalmic solution      fluticasone (FLONASE) 50 MCG/ACT nasal spray Place 2 sprays into both nostrils daily.  (Patient not taking: Reported on 05/07/2020)     No current facility-administered medications for this encounter.    REVIEW OF SYSTEMS:  A 15 point review of systems is documented in the electronic medical record. This was obtained by the nursing staff. However, I  reviewed this with the patient to discuss relevant findings and make appropriate changes.  Pertinent items noted in HPI and remainder of comprehensive ROS otherwise negative..  The patient completed an IPSS and IIEF questionnaire.  His IPSS score was 2 indicating mild urinary outflow obstructive symptoms.  He indicated that his erectile function IIEF is 6.   PHYSICAL EXAM: This patient is in no acute distress.  He is alert and oriented.   height is 5\' 9"  (1.753 m) and weight is 182 lb (82.6 kg). His temporal temperature is 96.9 F (36.1 C) (abnormal). His blood pressure is 150/78 (abnormal) and his pulse is 72. His respiration is 18 and oxygen saturation is 98%.  He exhibits no respiratory distress or labored breathing.  He appears neurologically intact.  His mood is pleasant.  His affect is appropriate.  Please note the digital rectal exam findings described above.  KPS = 100  100 - Normal; no complaints; no evidence of disease. 90   - Able to carry on normal activity; minor signs or symptoms of disease. 80   - Normal activity with effort; some signs or symptoms of disease. 24   - Cares for self; unable to carry on normal activity or to do active work. 60   - Requires occasional assistance, but is able to care for most of his personal needs. 50   - Requires considerable assistance and frequent medical care. 63   - Disabled; requires special care and assistance. 8   - Severely disabled; hospital admission is indicated although death not imminent. 85   - Very sick; hospital admission necessary; active supportive treatment necessary. 10   - Moribund; fatal processes progressing rapidly. 0     - Dead  Karnofsky DA, Abelmann [place], Craver LS and Burchenal [facility] [phone]) The use of the nitrogen mustards in the palliative treatment of carcinoma: with particular reference to bronchogenic carcinoma Cancer 1 [phone]   LABORATORY DATA:  Lab Results  Component Value Date   WBC 11.2 (H) 08/14/2018   HGB 15.8  08/14/2018   HCT 45.6 08/14/2018   MCV 94.0 08/14/2018   PLT 295 08/14/2018   Lab Results  Component Value Date   NA 139 08/14/2018   K 4.2 08/14/2018   CL 96 (L) 08/14/2018   CO2 30 08/14/2018   Lab Results  Component Value Date   ALT 28 08/14/2018   AST 26 08/14/2018   ALKPHOS 92 08/14/2018   BILITOT 1.0 08/14/2018     RADIOGRAPHY: No results found.    IMPRESSION: This gentleman is a 70 y.o. gentleman with stage T1c adenocarcinoma of the prostate with a Gleason's score of 3+4 and a PSA of 8.7.  His Gleason's Score puts him into the favorable intermediate risk group.  Accordingly he is eligible for a variety of potential treatment options including prostatectomy, IMRT and prostate brachytherapy.  His COPD and 70 pack year history of smoking may influence his  surgical risk factors.  PLAN:Today I reviewed the findings and workup thus far.  We discussed the natural history of prostate cancer.  We reviewed the the implications of T-stage, Gleason's Score, and PSA on decision-making and outcomes in prostate cancer.  We discussed radiation treatment in the management of prostate cancer with regard to the logistics and delivery of external beam radiation treatment as well as the logistics and delivery of prostate brachytherapy.  We compared and contrasted each of these approaches and also compared these against prostatectomy.  The patient expressed some interest in prostate brachytherapy, pending consultation with Dr. Tresa Moore.  I filled out a patient counseling form for him with relevant treatment diagrams and we retained a copy for our records.   The patient will consult with Dr. Tresa Moore on Monday about prostatectomy.  If he decides he would like to proceed with prostate brachytherapy, I provided contact information for our seed scheduler, Enid Derry, to move forward with scheduling the procedure in the near future.     I enjoyed meeting with him today, and will look forward to participating in the  care of this very nice gentleman.   I spent 60 minutes in the preparation, performance and documentation of this encounter.   ------------------------------------------------  Sheral Apley Tammi Klippel, M.D.

## 2020-05-06 ENCOUNTER — Encounter: Payer: Self-pay | Admitting: Radiation Oncology

## 2020-05-06 NOTE — Progress Notes (Signed)
GU Location of Tumor / Histology: prostatic adenocarcinoma  If Prostate Cancer, Gleason Score is (3 + 4) and PSA is (8.7). Prostate volume: 46.2 gm.  Rip Harbour. explains that he was told a little less than 1 year ago his PSA was elevated by his endocrinologist. Patient went onto explain that some time passed before anyone arranged an appointment for him with urology.   Biopsies of prostate (if applicable) revealed:    Past/Anticipated interventions by urology, if any: prostate biopsy, referral to Dr. Tresa Moore to discuss RALP, referral to Dr. Tammi Klippel to discuss IMRT/brachytherapy  Past/Anticipated interventions by medical oncology, if any: no  Weight changes, if any: no  Bowel/Bladder complaints, if any: IPSS 2. SHIM 6. Denies dysuria or hematuria. Denies urinary incontinence or leakage. Denies any bowel complaints.   Nausea/Vomiting, if any: no  Pain issues, if any:  denies  SAFETY ISSUES:  Prior radiation? denies  Pacemaker/ICD? denies  Possible current pregnancy? no, male patient  Is the patient on methotrexate? denies  Current Complaints / other details:  70 year old male. Former smoker. Retired Biochemist, clinical. Single. No children. Mother with history of breast cancer.

## 2020-05-07 ENCOUNTER — Ambulatory Visit
Admission: RE | Admit: 2020-05-07 | Discharge: 2020-05-07 | Disposition: A | Discharge status: Home / Self Care | Payer: Medicare Other | Source: Ambulatory Visit | Attending: Radiation Oncology | Admitting: Radiation Oncology

## 2020-05-07 ENCOUNTER — Encounter: Payer: Self-pay | Admitting: Licensed Clinical Social Worker

## 2020-05-07 ENCOUNTER — Other Ambulatory Visit: Payer: Self-pay

## 2020-05-07 ENCOUNTER — Encounter: Payer: Self-pay | Admitting: Radiation Oncology

## 2020-05-07 VITALS — BP 150/78 | HR 72 | Temp 96.9°F | Resp 18 | Ht 69.0 in | Wt 182.0 lb

## 2020-05-07 DIAGNOSIS — C61 Malignant neoplasm of prostate: Secondary | ICD-10-CM | POA: Diagnosis not present

## 2020-05-07 DIAGNOSIS — E119 Type 2 diabetes mellitus without complications: Secondary | ICD-10-CM | POA: Insufficient documentation

## 2020-05-07 DIAGNOSIS — E7800 Pure hypercholesterolemia, unspecified: Secondary | ICD-10-CM | POA: Diagnosis not present

## 2020-05-07 DIAGNOSIS — J449 Chronic obstructive pulmonary disease, unspecified: Secondary | ICD-10-CM | POA: Insufficient documentation

## 2020-05-07 DIAGNOSIS — F17210 Nicotine dependence, cigarettes, uncomplicated: Secondary | ICD-10-CM | POA: Insufficient documentation

## 2020-05-07 DIAGNOSIS — Z79899 Other long term (current) drug therapy: Secondary | ICD-10-CM | POA: Insufficient documentation

## 2020-05-07 DIAGNOSIS — I10 Essential (primary) hypertension: Secondary | ICD-10-CM | POA: Diagnosis not present

## 2020-05-07 DIAGNOSIS — Z794 Long term (current) use of insulin: Secondary | ICD-10-CM | POA: Insufficient documentation

## 2020-05-07 DIAGNOSIS — R972 Elevated prostate specific antigen [PSA]: Secondary | ICD-10-CM | POA: Diagnosis not present

## 2020-05-07 DIAGNOSIS — Z803 Family history of malignant neoplasm of breast: Secondary | ICD-10-CM | POA: Diagnosis not present

## 2020-05-07 HISTORY — DX: Malignant neoplasm of prostate: C61

## 2020-05-07 NOTE — Progress Notes (Signed)
[place] Psychosocial Distress Screening Clinical Social Work  Clinical Social Work was referred by distress screening protocol.  The patient scored a 6 on the Psychosocial Distress Thermometer which indicates moderate distress. Clinical Social Worker contacted patient by phone to assess for distress and other psychosocial needs.  Patient states that he is doing better after appointment this morning and getting more information about treatment options. He will decide what he wants to do after seeing Dr. Tresa Moore on Monday.   CSW informed patient of the support team and support services at [facility]. Patient expressed no resource or support needs at this time but may be interested in support group in the future. CSW provided contact information and encouraged patient to call with any questions or concerns.  ONCBCN DISTRESS SCREENING 05/07/2020  Screening Type Initial Screening  Distress experienced in past week (1-10) 6  Emotional problem type Adjusting to illness;Isolation/feeling alone  Spiritual/Religous concerns type Loss of sense of purpose  Physician notified of physical symptoms Yes  Referral to clinical psychology No  Referral to clinical social work Yes  Referral to dietition No  Referral to support programs Yes  Referral to palliative care No    Clinical Social Worker follow up needed: No.  If yes, follow up plan:  MICHELLE E ZAVALA, LCSW

## 2020-05-11 DIAGNOSIS — C61 Malignant neoplasm of prostate: Secondary | ICD-10-CM | POA: Diagnosis not present

## 2020-05-13 ENCOUNTER — Encounter (INDEPENDENT_AMBULATORY_CARE_PROVIDER_SITE_OTHER): Payer: Medicare Other | Admitting: Ophthalmology

## 2020-05-13 ENCOUNTER — Other Ambulatory Visit: Payer: Self-pay

## 2020-05-13 DIAGNOSIS — H35033 Hypertensive retinopathy, bilateral: Secondary | ICD-10-CM

## 2020-05-13 DIAGNOSIS — I10 Essential (primary) hypertension: Secondary | ICD-10-CM | POA: Diagnosis not present

## 2020-05-13 DIAGNOSIS — H43813 Vitreous degeneration, bilateral: Secondary | ICD-10-CM

## 2020-05-13 DIAGNOSIS — E113513 Type 2 diabetes mellitus with proliferative diabetic retinopathy with macular edema, bilateral: Secondary | ICD-10-CM

## 2020-05-18 ENCOUNTER — Encounter: Payer: Self-pay | Admitting: Medical Oncology

## 2020-05-19 ENCOUNTER — Telehealth: Payer: Self-pay | Admitting: *Deleted

## 2020-05-19 NOTE — Telephone Encounter (Signed)
CALLED PATIENT TO ASK QUESTIONS SPOKE WITH PATIENT

## 2020-05-19 NOTE — Progress Notes (Signed)
Spoke with patient to introduce myself as the prostate nurse navigator and discuss my role. He consulted with Dr. Shepard General, 05/07/20. He  met with Dr. Tresa Moore, 04/19/20 to discuss robotic prostatectomy. After considering his options, he would like to move froward with brachytherapy. I explained Enid Derry will begin scheduling  appointments and she will be in contact. He voiced understanding. I asked him to call me if he has questions or concerns. He voiced understanding. [place], [place] notified.

## 2020-05-25 ENCOUNTER — Telehealth: Payer: Self-pay | Admitting: *Deleted

## 2020-05-25 NOTE — Telephone Encounter (Signed)
CALLED PATIENT TO UPDATE, LVM FOR A RETURN CALL 

## 2020-05-26 DIAGNOSIS — E10649 Type 1 diabetes mellitus with hypoglycemia without coma: Secondary | ICD-10-CM | POA: Diagnosis not present

## 2020-05-26 DIAGNOSIS — E7800 Pure hypercholesterolemia, unspecified: Secondary | ICD-10-CM | POA: Diagnosis not present

## 2020-05-26 DIAGNOSIS — E10319 Type 1 diabetes mellitus with unspecified diabetic retinopathy without macular edema: Secondary | ICD-10-CM | POA: Diagnosis not present

## 2020-05-26 DIAGNOSIS — Z794 Long term (current) use of insulin: Secondary | ICD-10-CM | POA: Diagnosis not present

## 2020-05-26 DIAGNOSIS — E1042 Type 1 diabetes mellitus with diabetic polyneuropathy: Secondary | ICD-10-CM | POA: Diagnosis not present

## 2020-05-26 DIAGNOSIS — Z79899 Other long term (current) drug therapy: Secondary | ICD-10-CM | POA: Diagnosis not present

## 2020-05-26 DIAGNOSIS — E1065 Type 1 diabetes mellitus with hyperglycemia: Secondary | ICD-10-CM | POA: Diagnosis not present

## 2020-05-26 DIAGNOSIS — I10 Essential (primary) hypertension: Secondary | ICD-10-CM | POA: Diagnosis not present

## 2020-05-26 DIAGNOSIS — C61 Malignant neoplasm of prostate: Secondary | ICD-10-CM | POA: Diagnosis not present

## 2020-06-02 ENCOUNTER — Telehealth: Payer: Self-pay | Admitting: *Deleted

## 2020-06-02 NOTE — Telephone Encounter (Signed)
Called patient to inform of implant date, spoke with patient and he is aware of this procedure °

## 2020-06-09 DIAGNOSIS — H40051 Ocular hypertension, right eye: Secondary | ICD-10-CM | POA: Diagnosis not present

## 2020-06-09 DIAGNOSIS — H401131 Primary open-angle glaucoma, bilateral, mild stage: Secondary | ICD-10-CM | POA: Diagnosis not present

## 2020-06-17 ENCOUNTER — Telehealth: Payer: Self-pay | Admitting: *Deleted

## 2020-06-17 NOTE — Progress Notes (Signed)
  Radiation Oncology         [phone] ________________________________  Name: Douglas [name]. MRN: [id number]  Date: 06/18/2020  DOB: Jan 10, 1951  SIMULATION AND TREATMENT PLANNING NOTE PUBIC ARCH STUDY  [id number], No Pcp Per  Cleon Gustin, MD  DIAGNOSIS: 70 y.o. gentleman with stage T1c adenocarcinoma of the prostate with a Gleason's score of 3+4 and a PSA of 8.7  Oncology History  Cancer of prostate with intermediate recurrence risk (stage T2b-c or Gleason 7 or PSA 10-20) ([place])  05/05/2020 Initial Diagnosis   Cancer of prostate with intermediate recurrence risk (stage T2b-c or Gleason 7 or PSA 10-20) ([place])   05/07/2020 Cancer Staging   Staging form: Prostate, AJCC 8th Edition - Clinical stage from 05/07/2020: Stage IIB (cT1c, cN0, cM0, PSA: 8.7, Grade Group: 2) - Signed by [name], MD on 05/07/2020       ICD-10-CM   1. Cancer of prostate with intermediate recurrence risk (stage T2b-c or Gleason 7 or PSA 10-20) ([place])  C61     COMPLEX SIMULATION:  The patient presented today for evaluation for possible prostate seed implant. He was brought to the radiation planning suite and placed supine on the CT couch. A 3-dimensional image study set was obtained in upload to the planning computer. There, on each axial slice, I contoured the prostate gland. Then, using three-dimensional radiation planning tools I reconstructed the prostate in view of the structures from the transperineal needle pathway to assess for possible pubic arch interference. In doing so, I did not appreciate any pubic arch interference. Also, the patient's prostate volume was estimated based on the drawn structure. The volume was 47 cc.  Given the pubic arch appearance and prostate volume, patient remains a good candidate to proceed with prostate seed implant. Today, he freely provided informed written consent to proceed.    PLAN: The patient will undergo prostate seed  implant.   ________________________________  Sheral Apley. Tammi Klippel, M.D.

## 2020-06-17 NOTE — Telephone Encounter (Signed)
Called patient to remind of pre-seed appts. for 06-18-20, spoke with patient and he is aware of these appts.

## 2020-06-18 ENCOUNTER — Ambulatory Visit
Admission: RE | Admit: 2020-06-18 | Discharge: 2020-06-18 | Disposition: A | Discharge status: Home / Self Care | Payer: Medicare Other | Source: Ambulatory Visit | Attending: Urology | Admitting: Urology

## 2020-06-18 ENCOUNTER — Ambulatory Visit (HOSPITAL_COMMUNITY)
Admission: RE | Admit: 2020-06-18 | Discharge: 2020-06-18 | Disposition: A | Discharge status: Home / Self Care | Payer: Medicare Other | Source: Ambulatory Visit | Attending: Urology | Admitting: Urology

## 2020-06-18 ENCOUNTER — Other Ambulatory Visit: Payer: Self-pay

## 2020-06-18 ENCOUNTER — Encounter (HOSPITAL_COMMUNITY)
Admission: RE | Admit: 2020-06-18 | Discharge: 2020-06-18 | Disposition: A | Discharge status: Home / Self Care | Payer: Medicare Other | Source: Ambulatory Visit | Attending: Urology | Admitting: Urology

## 2020-06-18 ENCOUNTER — Ambulatory Visit
Admission: RE | Admit: 2020-06-18 | Discharge: 2020-06-18 | Disposition: A | Discharge status: Home / Self Care | Payer: Medicare Other | Source: Ambulatory Visit | Attending: Radiation Oncology | Admitting: Radiation Oncology

## 2020-06-18 DIAGNOSIS — Z01818 Encounter for other preprocedural examination: Secondary | ICD-10-CM

## 2020-06-18 DIAGNOSIS — Z510 Encounter for antineoplastic radiation therapy: Secondary | ICD-10-CM | POA: Diagnosis not present

## 2020-06-18 DIAGNOSIS — C61 Malignant neoplasm of prostate: Secondary | ICD-10-CM

## 2020-06-24 ENCOUNTER — Other Ambulatory Visit: Payer: Self-pay

## 2020-06-24 ENCOUNTER — Encounter (INDEPENDENT_AMBULATORY_CARE_PROVIDER_SITE_OTHER): Payer: Medicare Other | Admitting: Ophthalmology

## 2020-06-24 DIAGNOSIS — H43813 Vitreous degeneration, bilateral: Secondary | ICD-10-CM

## 2020-06-24 DIAGNOSIS — H35033 Hypertensive retinopathy, bilateral: Secondary | ICD-10-CM | POA: Diagnosis not present

## 2020-06-24 DIAGNOSIS — E103513 Type 1 diabetes mellitus with proliferative diabetic retinopathy with macular edema, bilateral: Secondary | ICD-10-CM

## 2020-06-24 DIAGNOSIS — I10 Essential (primary) hypertension: Secondary | ICD-10-CM | POA: Diagnosis not present

## 2020-06-30 DIAGNOSIS — H40051 Ocular hypertension, right eye: Secondary | ICD-10-CM | POA: Diagnosis not present

## 2020-06-30 DIAGNOSIS — E785 Hyperlipidemia, unspecified: Secondary | ICD-10-CM | POA: Diagnosis not present

## 2020-06-30 DIAGNOSIS — I10 Essential (primary) hypertension: Secondary | ICD-10-CM | POA: Diagnosis not present

## 2020-06-30 DIAGNOSIS — H401111 Primary open-angle glaucoma, right eye, mild stage: Secondary | ICD-10-CM | POA: Diagnosis not present

## 2020-07-08 DIAGNOSIS — Z794 Long term (current) use of insulin: Secondary | ICD-10-CM | POA: Diagnosis not present

## 2020-07-08 DIAGNOSIS — I10 Essential (primary) hypertension: Secondary | ICD-10-CM | POA: Diagnosis not present

## 2020-07-08 DIAGNOSIS — E119 Type 2 diabetes mellitus without complications: Secondary | ICD-10-CM | POA: Diagnosis not present

## 2020-07-08 DIAGNOSIS — E785 Hyperlipidemia, unspecified: Secondary | ICD-10-CM | POA: Diagnosis not present

## 2020-07-14 ENCOUNTER — Other Ambulatory Visit: Payer: Self-pay

## 2020-07-14 ENCOUNTER — Encounter (HOSPITAL_BASED_OUTPATIENT_CLINIC_OR_DEPARTMENT_OTHER): Payer: Self-pay | Admitting: Urology

## 2020-07-14 ENCOUNTER — Telehealth: Payer: Self-pay | Admitting: *Deleted

## 2020-07-14 DIAGNOSIS — H401121 Primary open-angle glaucoma, left eye, mild stage: Secondary | ICD-10-CM | POA: Diagnosis not present

## 2020-07-14 NOTE — Telephone Encounter (Signed)
Called patient to remind of lab and Covid testing for 07-16-20, spoke with patient and he is aware of these appts.

## 2020-07-14 NOTE — Progress Notes (Addendum)
Spoke w/ via phone for pre-op interview---pt Lab needs dos----none               Lab results------has lab appt 07-16-2020 800  for cbc cmet pt ptt COVID test ------07-16-2020 900 Arrive at -------1100 am 07-20-2020 NPO after MN NO Solid Food.  Clear liquids from MN until---1000 am then npo Med rec completed Medications to take morning of surgery -----eye drop, atorvastatin Diabetic medication -----none day of surgery take 1/2 dose of lantus hs insulin night ebfore surgery (take 8 units) Patient instructed to bring photo id and insurance card day of surgery Patient aware to have Driver (ride ) / caregiver sister sandra roach    for 24 hours after surgery  Patient Special Instructions -----fleets enema am of surgery Pre-Op special Istructions -----no smoking 24 hours before surgery Patient verbalized understanding of instructions that were given at this phone interview. Patient denies shortness of breath, chest pain, fever, cough at this phone interview.  ekg 06-18-2020 epic Chest xray 06-18-2020 epic endocrinology lov dr Nicoletta Dress 05-26-2020 care everywhere

## 2020-07-16 ENCOUNTER — Encounter (HOSPITAL_COMMUNITY)
Admission: RE | Admit: 2020-07-16 | Discharge: 2020-07-16 | Disposition: A | Discharge status: Home / Self Care | Payer: Medicare Other | Source: Ambulatory Visit | Attending: Urology | Admitting: Urology

## 2020-07-16 ENCOUNTER — Other Ambulatory Visit (HOSPITAL_COMMUNITY)
Admission: RE | Admit: 2020-07-16 | Discharge: 2020-07-16 | Disposition: A | Discharge status: Home / Self Care | Payer: Medicare Other | Source: Ambulatory Visit | Attending: Urology | Admitting: Urology

## 2020-07-16 ENCOUNTER — Other Ambulatory Visit: Payer: Self-pay

## 2020-07-16 DIAGNOSIS — Z20822 Contact with and (suspected) exposure to covid-19: Secondary | ICD-10-CM | POA: Diagnosis not present

## 2020-07-16 DIAGNOSIS — Z01812 Encounter for preprocedural laboratory examination: Secondary | ICD-10-CM | POA: Insufficient documentation

## 2020-07-16 LAB — COMPREHENSIVE METABOLIC PANEL
ALT: 29 U/L (ref 0–44)
AST: 26 U/L (ref 15–41)
Albumin: 4.2 g/dL (ref 3.5–5.0)
Alkaline Phosphatase: 105 U/L (ref 38–126)
Anion gap: 10 (ref 5–15)
BUN: 17 mg/dL (ref 8–23)
CO2: 28 mmol/L (ref 22–32)
Calcium: 9.2 mg/dL (ref 8.9–10.3)
Chloride: 103 mmol/L (ref 98–111)
Creatinine, Ser: 0.92 mg/dL (ref 0.61–1.24)
GFR, Estimated: >60 mL/min (ref >60)
Glucose, Bld: 123 mg/dL — ABNORMAL HIGH (ref 70–99)
Potassium: 4.1 mmol/L (ref 3.5–5.1)
Sodium: 141 mmol/L (ref 135–145)
Total Bilirubin: 0.9 mg/dL (ref 0.3–1.2)
Total Protein: 7.4 g/dL (ref 6.5–8.1)

## 2020-07-16 LAB — CBC
HCT: 43.9 % (ref 39.0–52.0)
Hemoglobin: 15.3 g/dL (ref 13.0–17.0)
MCH: 32.3 pg (ref 26.0–34.0)
MCHC: 34.9 g/dL (ref 30.0–36.0)
MCV: 92.6 fL (ref 80.0–100.0)
Platelets: 297 10*3/uL (ref 150–400)
RBC: 4.74 MIL/uL (ref 4.22–5.81)
RDW: 12.5 % (ref 11.5–15.5)
WBC: 7.6 10*3/uL (ref 4.0–10.5)
nRBC: 0 % (ref 0.0–0.2)

## 2020-07-16 LAB — PROTIME-INR
INR: 1.1 (ref 0.8–1.2)
Prothrombin Time: 13.3 seconds (ref 11.4–15.2)

## 2020-07-16 LAB — SARS CORONAVIRUS 2 (TAT 6-24 HRS): SARS Coronavirus 2: NEGATIVE

## 2020-07-16 LAB — APTT: aPTT: 31 seconds (ref 24–36)

## 2020-07-17 ENCOUNTER — Telehealth: Payer: Self-pay | Admitting: *Deleted

## 2020-07-17 NOTE — Telephone Encounter (Signed)
CALLED PATIENT TO REMIND OF PROCEDURE FOR 07-20-20, SPOKE WITH PATIENT AND HE IS AWARE OF THIS PROCEDURE.

## 2020-07-20 ENCOUNTER — Encounter (HOSPITAL_BASED_OUTPATIENT_CLINIC_OR_DEPARTMENT_OTHER): Payer: Self-pay | Admitting: Urology

## 2020-07-20 ENCOUNTER — Ambulatory Visit (HOSPITAL_BASED_OUTPATIENT_CLINIC_OR_DEPARTMENT_OTHER): Payer: Medicare Other | Admitting: Anesthesiology

## 2020-07-20 ENCOUNTER — Encounter (HOSPITAL_BASED_OUTPATIENT_CLINIC_OR_DEPARTMENT_OTHER)
Admission: RE | Disposition: A | Discharge status: Home / Self Care | Payer: Self-pay | Source: Ambulatory Visit | Attending: Urology

## 2020-07-20 ENCOUNTER — Other Ambulatory Visit: Payer: Self-pay

## 2020-07-20 ENCOUNTER — Ambulatory Visit (HOSPITAL_BASED_OUTPATIENT_CLINIC_OR_DEPARTMENT_OTHER)
Admission: RE | Admit: 2020-07-20 | Discharge: 2020-07-20 | Disposition: A | Discharge status: Home / Self Care | Payer: Medicare Other | Source: Ambulatory Visit | Attending: Urology | Admitting: Urology

## 2020-07-20 ENCOUNTER — Ambulatory Visit (HOSPITAL_COMMUNITY): Payer: Medicare Other

## 2020-07-20 DIAGNOSIS — E119 Type 2 diabetes mellitus without complications: Secondary | ICD-10-CM | POA: Diagnosis not present

## 2020-07-20 DIAGNOSIS — F17210 Nicotine dependence, cigarettes, uncomplicated: Secondary | ICD-10-CM | POA: Insufficient documentation

## 2020-07-20 DIAGNOSIS — E7800 Pure hypercholesterolemia, unspecified: Secondary | ICD-10-CM | POA: Diagnosis not present

## 2020-07-20 DIAGNOSIS — C61 Malignant neoplasm of prostate: Secondary | ICD-10-CM

## 2020-07-20 DIAGNOSIS — E109 Type 1 diabetes mellitus without complications: Secondary | ICD-10-CM | POA: Diagnosis not present

## 2020-07-20 DIAGNOSIS — I10 Essential (primary) hypertension: Secondary | ICD-10-CM | POA: Diagnosis not present

## 2020-07-20 HISTORY — PX: CYSTOSCOPY: SHX5120

## 2020-07-20 HISTORY — PX: SPACE OAR INSTILLATION: SHX6769

## 2020-07-20 HISTORY — DX: Type 1 diabetes mellitus without complications: E10.9

## 2020-07-20 HISTORY — DX: Unspecified glaucoma: H40.9

## 2020-07-20 HISTORY — DX: Presence of spectacles and contact lenses: Z97.3

## 2020-07-20 HISTORY — PX: RADIOACTIVE SEED IMPLANT: SHX5150

## 2020-07-20 HISTORY — DX: Dyspnea, unspecified: R06.00

## 2020-07-20 LAB — GLUCOSE, CAPILLARY
Glucose-Capillary: 244 mg/dL — ABNORMAL HIGH (ref 70–99)
Glucose-Capillary: 222 mg/dL — ABNORMAL HIGH (ref 70–99)
Glucose-Capillary: 135 mg/dL — ABNORMAL HIGH (ref 70–99)

## 2020-07-20 SURGERY — INSERTION, RADIATION SOURCE, PROSTATE
Anesthesia: General | Site: Prostate

## 2020-07-20 MED ORDER — FENTANYL CITRATE (PF) 100 MCG/2ML IJ SOLN: 25.0000 ug | INTRAMUSCULAR | Status: DC | PRN

## 2020-07-20 MED ORDER — LIDOCAINE 2% (20 MG/ML) 5 ML SYRINGE
INTRAMUSCULAR | Status: DC | PRN
  Administered 2020-07-20: 80 mg via INTRAVENOUS

## 2020-07-20 MED ORDER — SODIUM CHLORIDE (PF) 0.9 % IJ SOLN
INTRAMUSCULAR | Status: DC | PRN
  Administered 2020-07-20: 10 mL via INTRAVENOUS

## 2020-07-20 MED ORDER — PROPOFOL 10 MG/ML IV BOLUS
INTRAVENOUS | Status: DC | PRN
  Administered 2020-07-20: 170 mg via INTRAVENOUS

## 2020-07-20 MED ORDER — DEXAMETHASONE SODIUM PHOSPHATE 10 MG/ML IJ SOLN
INTRAMUSCULAR | Status: AC
  Filled 2020-07-20: qty 1

## 2020-07-20 MED ORDER — LIDOCAINE 2% (20 MG/ML) 5 ML SYRINGE
INTRAMUSCULAR | Status: AC
  Filled 2020-07-20: qty 5

## 2020-07-20 MED ORDER — PROPOFOL 10 MG/ML IV BOLUS
INTRAVENOUS | Status: AC
  Filled 2020-07-20: qty 20

## 2020-07-20 MED ORDER — INSULIN ASPART 100 UNIT/ML ~~LOC~~ SOLN
SUBCUTANEOUS | Status: AC
  Filled 2020-07-20: qty 1

## 2020-07-20 MED ORDER — DEXTROSE 5 % IV SOLN
2.0000 g | Freq: Once | INTRAVENOUS | Status: DC
  Filled 2020-07-20: qty 20

## 2020-07-20 MED ORDER — ONDANSETRON HCL 4 MG/2ML IJ SOLN
INTRAMUSCULAR | Status: DC | PRN
  Administered 2020-07-20: 4 mg via INTRAVENOUS

## 2020-07-20 MED ORDER — CEFAZOLIN SODIUM-DEXTROSE 2-4 GM/100ML-% IV SOLN
INTRAVENOUS | Status: AC
  Filled 2020-07-20: qty 100

## 2020-07-20 MED ORDER — ONDANSETRON HCL 4 MG/2ML IJ SOLN: 4.0000 mg | Freq: Once | INTRAMUSCULAR | Status: DC | PRN

## 2020-07-20 MED ORDER — ONDANSETRON HCL 4 MG/2ML IJ SOLN
INTRAMUSCULAR | Status: AC
  Filled 2020-07-20: qty 2

## 2020-07-20 MED ORDER — SODIUM CHLORIDE 0.9 % IR SOLN
Status: DC | PRN
  Administered 2020-07-20: 1000 mL

## 2020-07-20 MED ORDER — FENTANYL CITRATE (PF) 100 MCG/2ML IJ SOLN
INTRAMUSCULAR | Status: DC | PRN
  Administered 2020-07-20 (×2): 50 ug via INTRAVENOUS

## 2020-07-20 MED ORDER — CEFAZOLIN SODIUM-DEXTROSE 2-4 GM/100ML-% IV SOLN
2.0000 g | INTRAVENOUS | Status: AC
  Administered 2020-07-20: 2 g via INTRAVENOUS

## 2020-07-20 MED ORDER — LACTATED RINGERS IV SOLN: INTRAVENOUS | Status: DC

## 2020-07-20 MED ORDER — MIDAZOLAM HCL 5 MG/5ML IJ SOLN
INTRAMUSCULAR | Status: DC | PRN
  Administered 2020-07-20: 2 mg via INTRAVENOUS

## 2020-07-20 MED ORDER — INSULIN REGULAR HUMAN 100 UNIT/ML IJ SOLN
6.0000 [IU] | Freq: Once | INTRAMUSCULAR | Status: AC
  Administered 2020-07-20: 6 [IU] via SUBCUTANEOUS
  Filled 2020-07-20: qty 10

## 2020-07-20 MED ORDER — ACETAMINOPHEN 325 MG PO TABS: 325.0000 mg | ORAL_TABLET | ORAL | Status: DC | PRN

## 2020-07-20 MED ORDER — FENTANYL CITRATE (PF) 100 MCG/2ML IJ SOLN
INTRAMUSCULAR | Status: AC
  Filled 2020-07-20: qty 2

## 2020-07-20 MED ORDER — DEXAMETHASONE SODIUM PHOSPHATE 10 MG/ML IJ SOLN
INTRAMUSCULAR | Status: DC | PRN
  Administered 2020-07-20: 4 mg via INTRAVENOUS

## 2020-07-20 MED ORDER — ACETAMINOPHEN 160 MG/5ML PO SOLN: 325.0000 mg | ORAL | Status: DC | PRN

## 2020-07-20 MED ORDER — FLEET ENEMA 7-19 GM/118ML RE ENEM: 1.0000 | ENEMA | Freq: Once | RECTAL | Status: DC

## 2020-07-20 MED ORDER — OXYCODONE HCL 5 MG/5ML PO SOLN: 5.0000 mg | Freq: Once | ORAL | Status: DC | PRN

## 2020-07-20 MED ORDER — MEPERIDINE HCL 25 MG/ML IJ SOLN: 6.2500 mg | INTRAMUSCULAR | Status: DC | PRN

## 2020-07-20 MED ORDER — MIDAZOLAM HCL 2 MG/2ML IJ SOLN
INTRAMUSCULAR | Status: AC
  Filled 2020-07-20: qty 2

## 2020-07-20 MED ORDER — IOHEXOL 300 MG/ML  SOLN
INTRAMUSCULAR | Status: DC | PRN
  Administered 2020-07-20: 7 mL

## 2020-07-20 MED ORDER — OXYCODONE HCL 5 MG PO TABS: 5.0000 mg | ORAL_TABLET | Freq: Once | ORAL | Status: DC | PRN

## 2020-07-20 MED ORDER — TRAMADOL HCL 50 MG PO TABS: 50.0000 mg | ORAL_TABLET | Freq: Four times a day (QID) | ORAL | 0 refills | Status: DC | PRN

## 2020-07-20 SURGICAL SUPPLY — 35 items
BAG DRN RND TRDRP ANRFLXCHMBR (UROLOGICAL SUPPLIES) ×2
BAG URINE DRAIN 2000ML AR STRL (UROLOGICAL SUPPLIES) ×3 IMPLANT
BLADE CLIPPER SENSICLIP SURGIC (BLADE) ×3 IMPLANT
CATH FOLEY 2WAY SLVR  5CC 16FR (CATHETERS) ×3
CATH FOLEY 2WAY SLVR 5CC 16FR (CATHETERS) ×2 IMPLANT
CATH ROBINSON RED A/P 20FR (CATHETERS) ×3 IMPLANT
CLOTH BEACON ORANGE TIMEOUT ST (SAFETY) ×3 IMPLANT
CNTNR URN SCR LID CUP LEK RST (MISCELLANEOUS) ×4 IMPLANT
CONT SPEC 4OZ STRL OR WHT (MISCELLANEOUS) ×6
COVER BACK TABLE 60X90IN (DRAPES) ×3 IMPLANT
COVER MAYO STAND STRL (DRAPES) ×3 IMPLANT
DRAPE C-ARM 35X43 STRL (DRAPES) ×3 IMPLANT
DRSG TEGADERM 4X4.75 (GAUZE/BANDAGES/DRESSINGS) ×6 IMPLANT
DRSG TEGADERM 8X12 (GAUZE/BANDAGES/DRESSINGS) ×6 IMPLANT
GLOVE SURG ENC MOIS LTX SZ8 (GLOVE) ×3 IMPLANT
GLOVE SURG ORTHO LTX SZ8 (GLOVE) ×6 IMPLANT
GLOVE SURG UNDER POLY LF SZ6.5 (GLOVE) ×3 IMPLANT
GLOVE SURG UNDER POLY LF SZ7 (GLOVE) ×9 IMPLANT
GOWN STRL REUS W/TWL LRG LVL3 (GOWN DISPOSABLE) ×9 IMPLANT
HOLDER FOLEY CATH W/STRAP (MISCELLANEOUS) ×3 IMPLANT
I-Seed AgX100 ×162 IMPLANT
IMPL SPACEOAR VUE SYSTEM (Spacer) ×2 IMPLANT
IMPLANT SPACEOAR VUE SYSTEM (Spacer) ×3 IMPLANT
IV NS 1000ML (IV SOLUTION) ×3
IV NS 1000ML BAXH (IV SOLUTION) ×2 IMPLANT
KIT TURNOVER CYSTO (KITS) ×3 IMPLANT
MANIFOLD NEPTUNE II (INSTRUMENTS) IMPLANT
MARKER SKIN DUAL TIP RULER LAB (MISCELLANEOUS) ×3 IMPLANT
PACK CYSTO (CUSTOM PROCEDURE TRAY) ×3 IMPLANT
SURGILUBE 2OZ TUBE FLIPTOP (MISCELLANEOUS) ×3 IMPLANT
SYR 10ML LL (SYRINGE) ×6 IMPLANT
TOWEL OR 17X26 10 PK STRL BLUE (TOWEL DISPOSABLE) ×6 IMPLANT
UNDERPAD 30X36 HEAVY ABSORB (UNDERPADS AND DIAPERS) ×6 IMPLANT
WATER STERILE IRR 3000ML UROMA (IV SOLUTION) ×3 IMPLANT
WATER STERILE IRR 500ML POUR (IV SOLUTION) ×3 IMPLANT

## 2020-07-20 NOTE — Anesthesia Postprocedure Evaluation (Signed)
Anesthesia Post Note  Patient: Douglas [name].  Procedure(s) Performed: RADIOACTIVE SEED IMPLANT/BRACHYTHERAPY IMPLANT (N/A Prostate) SPACE OAR INSTILLATION (N/A Prostate) CYSTOSCOPY (N/A Bladder)     Patient location during evaluation: PACU Anesthesia Type: General Level of consciousness: awake and alert, oriented and awake Pain management: pain level controlled Vital Signs Assessment: post-procedure vital signs reviewed and stable Respiratory status: spontaneous breathing, nonlabored ventilation and respiratory function stable Cardiovascular status: blood pressure returned to baseline and stable Postop Assessment: no apparent nausea or vomiting Anesthetic complications: no   No complications documented.  Last Vitals:  Vitals:   07/20/20 1522 07/20/20 1600  BP: (!) 141/79 (!) 167/87  Pulse: 63 78  Resp: 13 18  Temp: (!) 36.4 C 36.7 C  SpO2: 95% 99%    Last Pain:  Vitals:   07/20/20 1600  TempSrc:   PainSc: 0-No pain                 Catalina Gravel

## 2020-07-20 NOTE — H&P (Signed)
Urology Admission H&P  Chief Complaint: prostate cancer  History of Present Illness: [name] is a 70yo here for brachytherapy with SpaceOAR. No significant LUTS. No complaints today  Past Medical History:  Diagnosis Date  . COPD (chronic obstructive pulmonary disease) ([facility])    no inhalers used  . DM type 1 (diabetes mellitus, type 1) ([place])   . Dyspnea    with heavy exertion  . Glaucoma   . Hypercholesteremia   . Hypertension   . Prostate cancer ([place])   . Wears glasses    for reading   Past Surgical History:  Procedure Laterality Date  . COLONOSCOPY N/A 04/05/2013   Procedure: COLONOSCOPY;  Surgeon: Danie Binder, MD;  Location: [facility];  Service: Endoscopy;  Laterality: N/A;  8:30 AM  . NO PAST SURGERIES    . PROSTATE BIOPSY      Home Medications:  Current Facility-Administered Medications  Medication Dose Route Frequency Provider Last Rate Last Admin  . ceFAZolin (ANCEF) IVPB 2g/100 mL premix  2 g Intravenous 60 min Pre-Op Shuntay Everetts, Candee Furbish, MD      . iohexol (OMNIPAQUE) 300 MG/ML solution    PRN Cleon Gustin, MD   7 mL at 07/20/20 1143  . lactated ringers infusion   Intravenous Continuous Audry Pili, MD 50 mL/hr at 07/20/20 1117 New Bag at 07/20/20 1117  . sodium chloride irrigation 0.9 %    PRN Cleon Gustin, MD   1,000 mL at 07/20/20 1142  . [START ON 07/21/2020] sodium phosphate (FLEET) 7-19 GM/118ML enema 1 enema  1 enema Rectal Once Ernestina Joe, Candee Furbish, MD       Allergies: No Known Allergies  Family History  Problem Relation Age of Onset  . Breast cancer Mother   . Colon cancer Neg Hx   . Pancreatic cancer Neg Hx   . Prostate cancer Neg Hx    Social History:  reports that he has been smoking cigarettes. He has a 35.00 pack-year smoking history. He has never used smokeless tobacco. He reports that he does not drink alcohol and does not use drugs.  Review of Systems  All other systems reviewed and are negative.   Physical Exam:   Vital signs in last 24 hours: Temp:  [98.5 F (36.9 C)] 98.5 F (36.9 C) (04/04 1048) Pulse Rate:  [76] 76 (04/04 1048) Resp:  [16] 16 (04/04 1048) BP: (158)/(77) 158/77 (04/04 1048) SpO2:  [96 %] 96 % (04/04 1048) Weight:  [80.3 kg] 80.3 kg (04/04 1048) Physical Exam Constitutional:      Appearance: Normal appearance.  HENT:     Head: Normocephalic and atraumatic.     Nose: Nose normal.     Mouth/Throat:     Mouth: Mucous membranes are dry.  Eyes:     Extraocular Movements: Extraocular movements intact.     Pupils: Pupils are equal, round, and reactive to light.  Cardiovascular:     Rate and Rhythm: Normal rate and regular rhythm.  Pulmonary:     Effort: Pulmonary effort is normal. No respiratory distress.  Abdominal:     General: Abdomen is flat. There is no distension.  Musculoskeletal:        General: No swelling. Normal range of motion.     Cervical back: Normal range of motion and neck supple.  Skin:    General: Skin is warm and dry.  Neurological:     General: No focal deficit present.     Mental Status: He is  alert and oriented to person, place, and time.  Psychiatric:        Mood and Affect: Mood normal.        Behavior: Behavior normal.        Thought Content: Thought content normal.        Judgment: Judgment normal.     Laboratory Data:  Results for orders placed or performed during the hospital encounter of 07/20/20 (from the past 24 hour(s))  Glucose, capillary     Status: Abnormal   Collection Time: 07/20/20 11:17 AM  Result Value Ref Range   Glucose-Capillary 244 (H) 70 - 99 mg/dL  Glucose, capillary     Status: Abnormal   Collection Time: 07/20/20 12:07 PM  Result Value Ref Range   Glucose-Capillary 222 (H) 70 - 99 mg/dL   Recent Results (from the past 240 hour(s))  SARS CORONAVIRUS 2 (TAT 6-24 HRS) Nasopharyngeal Nasopharyngeal Swab     Status: None   Collection Time: 07/16/20  8:46 AM   Specimen: Nasopharyngeal Swab  Result Value Ref Range  Status   SARS Coronavirus 2 NEGATIVE NEGATIVE Final    Comment: (NOTE) SARS-CoV-2 target nucleic acids are NOT DETECTED.  The SARS-CoV-2 RNA is generally detectable in upper and lower respiratory specimens during the acute phase of infection. Negative results do not preclude SARS-CoV-2 infection, do not rule out co-infections with other pathogens, and should not be used as the sole basis for treatment or other patient management decisions. Negative results must be combined with clinical observations, patient history, and epidemiological information. The expected result is Negative.  Fact Sheet for Patients: SugarRoll.be  Fact Sheet for Healthcare Providers: https://www.woods-mathews.com/  This test is not yet approved or cleared by the [place] FDA and  has been authorized for detection and/or diagnosis of SARS-CoV-2 by FDA under an Emergency Use Authorization (EUA). This EUA will remain  in effect (meaning this test can be used) for the duration of the COVID-19 declaration under Se ction 564(b)(1) of the Act, 21 U.S.C. section 360bbb-3(b)(1), unless the authorization is terminated or revoked sooner.  Performed at [facility], [place] [address], [place], [place] [address]    Creatinine: Recent Labs    07/16/20 0804  CREATININE 0.92   Baseline Creatinine: 0.9  Impression/Assessment:  69yo with prostate cancer  Plan:  The risks/benefits/alterantives to brachytherapy with SpaceOAR was explained to the patient and he understands and wishes to proceed with surgery  [name] 07/20/2020, 1:18 PM

## 2020-07-20 NOTE — Anesthesia Preprocedure Evaluation (Signed)
Anesthesia Evaluation  Patient identified by MRN, date of birth, ID band Patient awake    Reviewed: Allergy & Precautions, H&P , NPO status , Patient's Chart, lab work & pertinent test results, reviewed documented beta blocker date and time   Airway Mallampati: II  TM Distance: >3 FB Neck ROM: full    Dental no notable dental hx. (+) Teeth Intact, Dental Advisory Given   Pulmonary COPD, Current Smoker,    Pulmonary exam normal breath sounds clear to auscultation       Cardiovascular Exercise Tolerance: Good hypertension, Pt. on medications  Rhythm:regular Rate:Normal     Neuro/Psych    GI/Hepatic   Endo/Other  diabetes, Well Controlled, Type 1, Insulin Dependent  Renal/GU      Musculoskeletal   Abdominal   Peds  Hematology   Anesthesia Other Findings   Reproductive/Obstetrics                             Anesthesia Physical Anesthesia Plan  ASA: III  Anesthesia Plan: General   Post-op Pain Management:    Induction:   PONV Risk Score and Plan: Ondansetron and Treatment may vary due to age or medical condition  Airway Management Planned: LMA and Oral ETT  Additional Equipment:   Intra-op Plan:   Post-operative Plan: Extubation in OR  Informed Consent: I have reviewed the patients History and Physical, chart, labs and discussed the procedure including the risks, benefits and alternatives for the proposed anesthesia with the patient or authorized representative who has indicated his/her understanding and acceptance.     Dental Advisory Given  Plan Discussed with: CRNA and Anesthesiologist  Anesthesia Plan Comments:         Anesthesia Quick Evaluation

## 2020-07-20 NOTE — Anesthesia Procedure Notes (Signed)
Procedure Name: LMA Insertion Date/Time: 07/20/2020 1:28 PM Performed by: Genelle Bal, CRNA Pre-anesthesia Checklist: Patient identified, Emergency Drugs available, Suction available and Patient being monitored Patient Re-evaluated:Patient Re-evaluated prior to induction Oxygen Delivery Method: Circle system utilized Preoxygenation: Pre-oxygenation with 100% oxygen Induction Type: IV induction Ventilation: Mask ventilation without difficulty LMA: LMA inserted LMA Size: 4.0 Number of attempts: 1 Airway Equipment and Method: Bite block Placement Confirmation: positive ETCO2 Tube secured with: Tape Dental Injury: Teeth and Oropharynx as per pre-operative assessment

## 2020-07-20 NOTE — Transfer of Care (Signed)
Immediate Anesthesia Transfer of Care Note  Patient: Douglas [name].  Procedure(s) Performed: RADIOACTIVE SEED IMPLANT/BRACHYTHERAPY IMPLANT (N/A Prostate) SPACE OAR INSTILLATION (N/A Prostate) CYSTOSCOPY (N/A Bladder)  Patient Location: PACU  Anesthesia Type:General  Level of Consciousness: awake, alert  and oriented  Airway & Oxygen Therapy: Patient Spontanous Breathing and Patient connected to face mask oxygen  Post-op Assessment: Report given to RN and Post -op Vital signs reviewed and stable  Post vital signs: Reviewed and stable  Last Vitals:  Vitals Value Taken Time  BP 133/70 07/20/20 1452  Temp    Pulse 59 07/20/20 1453  Resp 13 07/20/20 1453  SpO2 99 % 07/20/20 1453  Vitals shown include unvalidated device data.  Last Pain:  Vitals:   07/20/20 1048  TempSrc: Oral         Complications: No complications documented.

## 2020-07-20 NOTE — Discharge Instructions (Signed)
Radioactive Seed Implant Home Care Instructions   Activity:    Rest for the remainder of the day.  Do not drive or operate equipment today.  You may resume normal  activities in a few days as instructed by your physician, without risk of harmful radiation exposure to those around you, provided you follow the time and distance precautions on the Radiation Oncology Instruction Sheet.   Meals: Drink plenty of lipuids and eat light foods, such as gelatin or soup this evening .  You may return to normal meal plan tomorrow.  Return To Work: You may return to work as instructed by Naval architect.  Special Instruction:   If any seeds are found, use tweezers to pick up seeds and place in a glass container of any kind and bring to your physician's office.  Call your physician if any of these symptoms occur:   Persistent or heavy bleeding  Urine stream diminishes or stops completely after catheter is removed  Fever equal to or greater than 101 degrees F  Cloudy urine with a strong foul odor  Severe pain  You may feel some burning pain and/or hesitancy when you urinate after the catheter is removed.  These symptoms may increase over the next few weeks, but should diminish within forur to six weeks.  Applying moist heat to the lower abdomen or a hot tub bath may help relieve the pain.  If the discomfort becomes severe, please call your physician for additional medications.   PROSTATE CANCER TREATMENT WITH RADIOACTIVE IODINE-125 SEED IMPLANT  This instruction sheet is intended to discuss implantation of Iodine-125 seeds as treatment for cancer of the prostate. It will explain in detail what you may expect from this treatment and what precautions are necessary as a result of the treatment. Iodine-125 emits a relatively low energy radiation. The radioactive seeds are surgically implanted directly into the prostate gland. Most of the radiation is contained within the prostate gland. A very small  amount is present outside the body.The precautions that we ask you to take are to ensure that those around you are protected from unnecessary radiation. The principles of radiation safety that you need to understand are:  DISTANCE: The further a person is from the radioactive implant the less radiation they will be receiving. The amount of radiation received falls off quite rapidly with distance. More specific guidelines are given in the table on the last page.  TIME: The amount of radiation a person is exposed to is directly proportional to the amount of time that is spent in close proximity to the radioactive implant. Very little radiation will be received during short periods. See the table on the last page for more specific guideline.  CHILDREN UNDER AGE 84 Children should not be allowed to sit on your lap or otherwise be in very close contact for more than a few minutes for the first 69 weeks following the implant. You may affectionately greet (hug/kiss) a child for a short period of time, but remember, the longer you are in close proximity with that child the more radiation they are being exposed to. At a distance of 6 feet there is no limit to the length of time you may spend together. See specific guidelines on the last page.  PREGNANT OR POSSIBLY PREGNANT WOMEN Pregnant women should avoid prolonged close physical contact with you for the first 69 weeks after implant. At a distance of 6 feet there is no limit to the length of time you may spend  together. Pregnant women or possibly pregnant women can safely be in close contact with you for a limited period of time. See the last page for guidelines.  FAMILY RELATIONS You may sleep in the same bed as your partner (provided she is not pregnant or under the age of 69). Sexual intercourse, using a condom, may be resumed 2 weeks after the implant. Your semen may be discolored, dark brown or black. This is normal and is the result of bleeding that may  have occurred during the implant. After 3-4 weeks it will not be necessary to use a condom.  DAILY ACTIVITIES You may resume normal activities in a few days (example: work, shopping, church) without the risk of harmful radiation exposure to those around you provided you keep in mind the time and distance precautions. Objects that you touch or item that you use do not become radioactive. Linens, clothing, tableware, and dishes may be used by other persons without special precautions. Your bodily wastes (urine and stool) are not radioactive.  SPECIAL PRECAUTIONS It is possible to lose implanted Iodine-125 seed(s) through urination. Although it is possible to pass seeds indefinitely, it is most likely to occur immediately after catheter removal. To prevent this from happening the catheter that was in place during the implant procedure is removed immediately after the implant and a cystoscopy procedure is performed. The process of removing the catheter and the cystoscopy procedure should dislodge and remove any seeds that are not firmly imbedded in the prostate tissue. However, you should watch for seeds if/when you remove your catheter at home. The seeds are silver colored and the size of a grain of rice. In the unlikely event that a seed is seen after urination, simply flush the seed down the toilet. The seed should not be handled with your fingers, not even with a glove or napkin. A spoon or tweezers can be used to pick up a seed. The Radiation Oncology department is open Monday - Friday from 8:00 am to 5:30 pm with a Radiation Oncologist on call at all times. He or she may be reached by calling [phone]. If you are to be hospitalized or if death should occur, your family should notify the Runner, broadcasting/film/video.  SIDE EFFECTS There are very few side effects associate with the implant procedure. Minor burning with urination, weak stream, hesitancy, intermittency, frequency, mild pain or feeling  unable to pass your urine freely are common and usually stop in one to four months. If these symptoms are extremely uncomfortable, contact your physician.  RADIATION SAFETY GUIDELINES PROSTATE CANCER TREATMENT WITH RADIOACTIVE IODINE-125 SEED IMPLANT  The following guidelines will limit exposure to less than naturally occurring background radiation.  PERSONS AGE 66-45 (if able to become pregnant)  FOR 8 WEEKS FOLLOWING IMPLANT  At a distance of 1 foot: limit time to less than 2 hours/week At a distance of 3 feet: limit time to 20 hours/week At a distance of 6 feet: no restrictions  AFTER 8 WEEKS No restrictions  CHILDREN UNDER AGE 66, PREGNANT WOMEN OR POSSIBLY PREGNANT WOMEN  FOR 8 WEEKS FOLLOWING IMPLANT At a distance of 1 foot: limit time to 10 minutes/week At a distance of 3 feet: limit time to 2 hours/week At a distance of 6 feet: no restrictions  AFTER 8 WEEKS No restrictions  PERSONS OVER THE AGE OF 45 AND DO NOT EXPECT TO HAVE ANY MORE CHILDREN No restrictions Updated by [facility] in January 2020  Indwelling Urinary Catheter Care, Adult An indwelling  urinary catheter is a thin tube that is put into your bladder. The tube helps to drain pee (urine) out of your body. The tube goes in through your urethra. Your urethra is where pee comes out of your body. Your pee will come out through the catheter, then it will go into a bag (drainage bag). Take good care of your catheter so it will work well. How to wear your catheter and bag Supplies needed  Sticky tape (adhesive tape) or a leg strap.  Alcohol wipe or soap and water (if you use tape).  A clean towel (if you use tape).  Large overnight bag.  Smaller bag (leg bag). Wearing your catheter Attach your catheter to your leg with tape or a leg strap.  Make sure the catheter is not pulled tight.  If a leg strap gets wet, take it off and put on a dry strap.  If you use tape to hold the bag on your leg: 1. Use an  alcohol wipe or soap and water to wash your skin where the tape made it sticky before. 2. Use a clean towel to pat-dry that skin. 3. Use new tape to make the bag stay on your leg. Wearing your bags You should have been given a large overnight bag.  You may wear the overnight bag in the day or night.  Always have the overnight bag lower than your bladder.  Do not let the bag touch the floor.  Before you go to sleep, put a clean plastic bag in a wastebasket. Then hang the overnight bag inside the wastebasket. You should also have a smaller leg bag that fits under your clothes.  Always wear the leg bag below your knee.  Do not wear your leg bag at night. How to care for your skin and catheter Supplies needed  A clean washcloth.  Water and mild soap.  A clean towel. Caring for your skin and catheter  Clean the skin around your catheter every day: 1. Wash your hands with soap and water. 2. Wet a clean washcloth in warm water and mild soap. 3. Clean the skin around your urethra.  If you are male:  Gently spread the folds of skin around your vagina (labia).  With the washcloth in your other hand, wipe the inner side of your labia on each side. Wipe from front to back.  If you are male:  Pull back any skin that covers the end of your penis (foreskin).  With the washcloth in your other hand, wipe your penis in small circles. Start wiping at the tip of your penis, then move away from the catheter.  Move the foreskin back in place, if needed. 4. With your free hand, hold the catheter close to where it goes into your body.  Keep holding the catheter during cleaning so it does not get pulled out. 5. With the washcloth in your other hand, clean the catheter.  Only wipe downward on the catheter.  Do not wipe upward toward your body. Doing this may push germs into your urethra and cause infection. 6. Use a clean towel to pat-dry the catheter and the skin around it. Make sure to  wipe off all soap. 7. Wash your hands with soap and water.  Shower every day. Do not take baths.  Do not use cream, ointment, or lotion on the area where the catheter goes into your body, unless your doctor tells you to.  Do not use powders, sprays, or lotions on  your genital area.  Check your skin around the catheter every day for signs of infection. Check for: ? Redness, swelling, or pain. ? Fluid or blood. ? Warmth. ? Pus or a bad smell.      How to empty the bag Supplies needed  Rubbing alcohol.  Gauze pad or cotton ball.  Tape or a leg strap. Emptying the bag Pour the pee out of your bag when it is ?- full, or at least 2-3 times a day. Do this for your overnight bag and your leg bag. 1. Wash your hands with soap and water. 2. Separate (detach) the bag from your leg. 3. Hold the bag over the toilet or a clean pail. Keep the bag lower than your hips and bladder. This is so the pee (urine) does not go back into the tube. 4. Open the pour spout. It is at the bottom of the bag. 5. Empty the pee into the toilet or pail. Do not let the pour spout touch any surface. 6. Put rubbing alcohol on a gauze pad or cotton ball. 7. Use the gauze pad or cotton ball to clean the pour spout. 8. Close the pour spout. 9. Attach the bag to your leg with tape or a leg strap. 10. Wash your hands with soap and water. Follow instructions for cleaning the drainage bag:  From the product maker.  As told by your doctor. How to change the bag Supplies needed  Alcohol wipes.  A clean bag.  Tape or a leg strap. Changing the bag Replace your bag when it starts to leak, smell bad, or look dirty. 1. Wash your hands with soap and water. 2. Separate the dirty bag from your leg. 3. Pinch the catheter with your fingers so that pee does not spill out. 4. Separate the catheter tube from the bag tube where these tubes connect (at the connection valve). Do not let the tubes touch any  surface. 5. Clean the end of the catheter tube with an alcohol wipe. Use a different alcohol wipe to clean the end of the bag tube. 6. Connect the catheter tube to the tube of the clean bag. 7. Attach the clean bag to your leg with tape or a leg strap. Do not make the bag tight on your leg. 8. Wash your hands with soap and water. General rules  Never pull on your catheter. Never try to take it out. Doing that can hurt you.  Always wash your hands before and after you touch your catheter or bag. Use a mild, fragrance-free soap. If you do not have soap and water, use hand sanitizer.  Always make sure there are no twists or bends (kinks) in the catheter tube.  Always make sure there are no leaks in the catheter or bag.  Drink enough fluid to keep your pee pale yellow.  Do not take baths, swim, or use a hot tub.  If you are male, wipe from front to back after you poop (have a bowel movement).   Contact a doctor if:  Your pee is cloudy.  Your pee smells worse than usual.  Your catheter gets clogged.  Your catheter leaks.  Your bladder feels full. Get help right away if:  You have redness, swelling, or pain where the catheter goes into your body.  You have fluid, blood, pus, or a bad smell coming from the area where the catheter goes into your body.  Your skin feels warm where the catheter goes into your  body.  You have a fever.  You have pain in your: ? Belly (abdomen). ? Legs. ? Lower back. ? Bladder.  You see blood in the catheter.  Your pee is pink or red.  You feel sick to your stomach (nauseous).  You throw up (vomit).  You have chills.  Your pee is not draining into the bag.  Your catheter gets pulled out. Summary  An indwelling urinary catheter is a thin tube that is placed into the bladder to help drain pee (urine) out of the body.  The catheter is placed into the part of the body that drains pee from the bladder (urethra).  Taking good care of  your catheter will keep it working properly and help prevent problems.  Always wash your hands before and after touching your catheter or bag.  Never pull on your catheter or try to take it out. This information is not intended to replace advice given to you by your health care provider. Make sure you discuss any questions you have with your health care provider. Document Revised: 07/27/2018 Document Reviewed: 11/18/2016 Elsevier Patient Education  2021 [place] Instructions  Activity: Get plenty of rest for the remainder of the day. A responsible individual must stay with you for 24 hours following the procedure.  For the next 24 hours, DO NOT: -Drive a car -Paediatric nurse -Drink alcoholic beverages -Take any medication unless instructed by your physician -Make any legal decisions or sign important papers.  Meals: Start with liquid foods such as gelatin or soup. Progress to regular foods as tolerated. Avoid greasy, spicy, heavy foods. If nausea and/or vomiting occur, drink only clear liquids until the nausea and/or vomiting subsides. Call your physician if vomiting continues.  Special Instructions/Symptoms: Your throat may feel dry or sore from the anesthesia or the breathing tube placed in your throat during surgery. If this causes discomfort, gargle with warm salt water. The discomfort should disappear within 24 hours.

## 2020-07-20 NOTE — Op Note (Signed)
PRE-OPERATIVE DIAGNOSIS:  Adenocarcinoma of the prostate  POST-OPERATIVE DIAGNOSIS:  Same  PROCEDURE:  Procedure(s): 1. I-125 radioactive seed implantation 2. Cystoscopy 3. SpaceOAR placement  SURGEON:  Surgeon(s): [name], MD  Radiation oncologist: [name], MD  ANESTHESIA:  General  EBL:  Minimal  DRAINS: 69 French Foley catheter  INDICATION: Douglas [name]. is a 71 year old with a history of T1c prostate cancer. After discussing treatment options he has elected to proceed with brachytherapy  Description of procedure: After informed consent the patient was brought to the major OR, placed on the table and administered general anesthesia. He was then moved to the modified lithotomy position with his perineum perpendicular to the floor. His perineum and genitalia were then sterilely prepped. An official timeout was then performed. A 16 French Foley catheter was then placed in the bladder and filled with dilute contrast, a rectal tube was placed in the rectum and the transrectal ultrasound probe was placed in the rectum and affixed to the stand. He was then sterilely draped.  Real time ultrasonography was used along with the seed planning software [employer] Prostate vs. 4.2.21. This was used to develop the seed plan including the number of needles as well as number of seeds required for complete and adequate coverage. Real-time ultrasonography was then used along with the previously developed plan and the Nucletron device to implant a total of 54 seeds using 18 needles. This proceeded without difficulty or complication.  We then proceeded to mix the SpaceOAR using the kit supplied from the manufacturer. Once this was complete we placed a sinal needle into the perirectal fat between the rectum and the prostate. Once this was accomplished we injected 2cc of normal saline to hydrodissect the plain. We then instilled the the SpaceOAR through the spinal needle and noted good  distribution in the perirectal fat.    A Foley catheter was then removed as well as the transrectal ultrasound probe and rectal probe. Flexible cystoscopy was then performed using the 17 French flexible scope which revealed a normal urethra throughout its length down to the sphincter which appeared intact. The prostatic urethra revealed bilobar hypertrophy but no evidence of obstruction, seeds, spacers or lesions. The bladder was then entered and fully and systematically inspected. The ureteral orifices were noted to be of normal configuration and position. The mucosa revealed no evidence of tumors. There were also no stones identified within the bladder. I noted no seeds or spacers on the floor of the bladder and retroflexion of the scope revealed no seeds protruding from the base of the prostate.  The cystoscope was then removed and a new 18 French Foley catheter was then inserted and the balloon was filled with 10 cc of sterile water. This was connected to closed system drainage and the patient was awakened and taken to recovery room in stable and satisfactory condition. He tolerated procedure well and there were no intraoperative complications.

## 2020-07-20 NOTE — Progress Notes (Incomplete)
  Radiation Oncology         [phone]) [phone] ________________________________  Name: Rip Harbour. MRN: [id number]  Date: 07/20/2020  DOB: [birth year]       Prostate Seed Implant  XN:[facility], [facility]  No ref. provider found  DIAGNOSIS:  70 y.o. gentleman with stage T1c adenocarcinoma of the prostate with a Gleason's score of 3+4 and a PSA of 8.7  Oncology History  Cancer of prostate with intermediate recurrence risk (stage T2b-c or Gleason 7 or PSA 10-20) ([place])  05/05/2020 Initial Diagnosis   Cancer of prostate with intermediate recurrence risk (stage T2b-c or Gleason 7 or PSA 10-20) ([place])   05/07/2020 Cancer Staging   Staging form: Prostate, AJCC 8th Edition - Clinical stage from 05/07/2020: Stage IIB (cT1c, cN0, cM0, PSA: 8.7, Grade Group: 2) - Signed by [name], MD on 05/07/2020       ICD-10-CM   1. Cancer of prostate with intermediate recurrence risk (stage T2b-c or Gleason 7 or PSA 10-20) ([facility])  C61 sodium phosphate (FLEET) 7-19 GM/118ML enema 1 enema    DISCONTINUED: ceFAZolin (ANCEF) 2 g in dextrose 5 % 50 mL IVPB    PROCEDURE: Insertion of radioactive I-125 seeds into the prostate gland.  RADIATION DOSE: 145 Gy, definitive therapy.  TECHNIQUE: Jennings Corado. was brought to the operating room with the urologist. He was placed in the dorsolithotomy position. He was catheterized and a rectal tube was inserted. The perineum was shaved, prepped and draped. The ultrasound probe was then introduced into the rectum to see the prostate gland.  TREATMENT DEVICE: A needle grid was attached to the ultrasound probe stand and anchor needles were placed.  3D PLANNING: The prostate was imaged in 3D using a sagittal sweep of the prostate probe. These images were transferred to the planning computer. There, the prostate, urethra and rectum were defined on each axial reconstructed image. Then, the software created an optimized 3D plan and a few seed positions were adjusted. The  quality of the plan was reviewed using [facility] information for the target and the following two organs at risk:  Urethra and Rectum.  Then the accepted plan was printed and handed off to the radiation therapist.  Under my supervision, the custom loading of the seeds and spacers was carried out and loaded into sealed vicryl sleeves.  These pre-loaded needles were then placed into the needle holder.Marland Kitchen  PROSTATE VOLUME STUDY:  Using transrectal ultrasound the volume of the prostate was verified to be *** cc.  SPECIAL TREATMENT PROCEDURE/SUPERVISION AND HANDLING: The pre-loaded needles were then delivered under sagittal guidance. A total of *** needles were used to deposit *** seeds in the prostate gland. The individual seed activity was *** mCi.  SpaceOAR:  ***  COMPLEX SIMULATION: At the end of the procedure, an anterior radiograph of the pelvis was obtained to document seed positioning and count. Cystoscopy was performed to check the urethra and bladder.  MICRODOSIMETRY: At the end of the procedure, the patient was emitting *** mR/hr at 1 meter. Accordingly, he was considered safe for hospital discharge.  PLAN: The patient will return to the radiation oncology clinic for post implant CT dosimetry in three weeks.   ________________________________  Sheral Apley Tammi Klippel, M.D.

## 2020-07-21 ENCOUNTER — Encounter (HOSPITAL_BASED_OUTPATIENT_CLINIC_OR_DEPARTMENT_OTHER): Payer: Self-pay | Admitting: Urology

## 2020-07-21 ENCOUNTER — Telehealth: Payer: Self-pay

## 2020-07-21 NOTE — Telephone Encounter (Signed)
Pt called asking questions about after his surgery instructions. Wanted to know next appointment. Date and time given. Also asked about a leg bag for cath. One sent up front for pickup.

## 2020-07-21 NOTE — Progress Notes (Signed)
  Radiation Oncology         [phone] ________________________________  Name: Douglas [name]. MRN: [id number]  Date: 07/21/2020  DOB: 1950-09-06       Prostate Seed Implant  OI:[facility], [facility]  No ref. provider found  DIAGNOSIS:   70 y.o. gentleman with stage T1c adenocarcinoma of the prostate with a Gleason's score of 3+4 and a PSA of 8.7  Oncology History  Cancer of prostate with intermediate recurrence risk (stage T2b-c or Gleason 7 or PSA 10-20) ([place])  05/05/2020 Initial Diagnosis   Cancer of prostate with intermediate recurrence risk (stage T2b-c or Gleason 7 or PSA 10-20) ([place])   05/07/2020 Cancer Staging   Staging form: Prostate, AJCC 8th Edition - Clinical stage from 05/07/2020: Stage IIB (cT1c, cN0, cM0, PSA: 8.7, Grade Group: 2) - Signed by [name], MD on 05/07/2020       ICD-10-CM   1. Cancer of prostate with intermediate recurrence risk (stage T2b-c or Gleason 7 or PSA 10-20) (HCC)  C61 DISCONTINUED: sodium phosphate (FLEET) 7-19 GM/118ML enema 1 enema    DISCONTINUED: ceFAZolin (ANCEF) 2 g in dextrose 5 % 50 mL IVPB    PROCEDURE: Insertion of radioactive I-125 seeds into the prostate gland.  RADIATION DOSE: 145 Gy, definitive therapy.  TECHNIQUE: Douglas [name]. was brought to the operating room with the urologist. He was placed in the dorsolithotomy position. He was catheterized and a rectal tube was inserted. The perineum was shaved, prepped and draped. The ultrasound probe was then introduced into the rectum to see the prostate gland.  TREATMENT DEVICE: A needle grid was attached to the ultrasound probe stand and anchor needles were placed.  3D PLANNING: The prostate was imaged in 3D using a sagittal sweep of the prostate probe. These images were transferred to the planning computer. There, the prostate, urethra and rectum were defined on each axial reconstructed image. Then, the software created an optimized 3D plan and a few seed positions  were adjusted. The quality of the plan was reviewed using [employer] information for the target and the following two organs at risk:  Urethra and Rectum.  Then the accepted plan was printed and handed off to the radiation therapist.  Under my supervision, the custom loading of the seeds and spacers was carried out and loaded into sealed vicryl sleeves.  These pre-loaded needles were then placed into the needle holder.Marland Kitchen  PROSTATE VOLUME STUDY:  Using transrectal ultrasound the volume of the prostate was verified to be 38.3 cc.  SPECIAL TREATMENT PROCEDURE/SUPERVISION AND HANDLING: The pre-loaded needles were then delivered under sagittal guidance. A total of 18 needles were used to deposit 54 seeds in the prostate gland. The individual seed activity was 0.550 mCi.  SpaceOAR:  Yes  COMPLEX SIMULATION: At the end of the procedure, an anterior radiograph of the pelvis was obtained to document seed positioning and count. Cystoscopy was performed to check the urethra and bladder.  MICRODOSIMETRY: At the end of the procedure, the patient was emitting 0.11 mR/hr at 1 meter. Accordingly, he was considered safe for hospital discharge.  PLAN: The patient will return to the radiation oncology clinic for post implant CT dosimetry in three weeks.   ________________________________  Douglas [name], M.D.

## 2020-07-30 ENCOUNTER — Other Ambulatory Visit: Payer: Self-pay

## 2020-07-30 ENCOUNTER — Encounter (INDEPENDENT_AMBULATORY_CARE_PROVIDER_SITE_OTHER): Payer: Medicare Other | Admitting: Ophthalmology

## 2020-07-30 ENCOUNTER — Ambulatory Visit (INDEPENDENT_AMBULATORY_CARE_PROVIDER_SITE_OTHER): Payer: Medicare Other

## 2020-07-30 DIAGNOSIS — C61 Malignant neoplasm of prostate: Secondary | ICD-10-CM

## 2020-07-30 NOTE — Progress Notes (Signed)
Fill and Pull Catheter Removal  Patient is present today for a catheter removal.  Patient was cleaned and prepped in a sterile fashion 30ml of sterile water/ saline was instilled into the bladder when the patient felt the urge to urinate. 21ml of water was then drained from the balloon.  A 16FR foley cath was removed from the bladder no complications were noted .  Patient as then given some time to void on their own.  Patient can void  66ml on their own after some time.  Patient tolerated well.  Performed by: Michaiah Maiden, lpn  Follow up/ Additional notes: 1 month post op

## 2020-07-31 ENCOUNTER — Other Ambulatory Visit: Payer: Self-pay

## 2020-07-31 ENCOUNTER — Encounter (INDEPENDENT_AMBULATORY_CARE_PROVIDER_SITE_OTHER): Payer: Medicare Other | Admitting: Ophthalmology

## 2020-07-31 DIAGNOSIS — I10 Essential (primary) hypertension: Secondary | ICD-10-CM

## 2020-07-31 DIAGNOSIS — H43813 Vitreous degeneration, bilateral: Secondary | ICD-10-CM | POA: Diagnosis not present

## 2020-07-31 DIAGNOSIS — H35033 Hypertensive retinopathy, bilateral: Secondary | ICD-10-CM

## 2020-07-31 DIAGNOSIS — E103513 Type 1 diabetes mellitus with proliferative diabetic retinopathy with macular edema, bilateral: Secondary | ICD-10-CM | POA: Diagnosis not present

## 2020-08-01 ENCOUNTER — Emergency Department (HOSPITAL_COMMUNITY)
Admission: EM | Admit: 2020-08-01 | Discharge: 2020-08-01 | Disposition: A | Discharge status: Home / Self Care | Payer: Medicare Other | Attending: Emergency Medicine | Admitting: Emergency Medicine

## 2020-08-01 ENCOUNTER — Encounter (HOSPITAL_COMMUNITY): Payer: Self-pay | Admitting: Emergency Medicine

## 2020-08-01 DIAGNOSIS — Z79899 Other long term (current) drug therapy: Secondary | ICD-10-CM | POA: Insufficient documentation

## 2020-08-01 DIAGNOSIS — J449 Chronic obstructive pulmonary disease, unspecified: Secondary | ICD-10-CM | POA: Insufficient documentation

## 2020-08-01 DIAGNOSIS — R300 Dysuria: Secondary | ICD-10-CM | POA: Diagnosis present

## 2020-08-01 DIAGNOSIS — N390 Urinary tract infection, site not specified: Secondary | ICD-10-CM | POA: Insufficient documentation

## 2020-08-01 DIAGNOSIS — I10 Essential (primary) hypertension: Secondary | ICD-10-CM | POA: Insufficient documentation

## 2020-08-01 DIAGNOSIS — Z8546 Personal history of malignant neoplasm of prostate: Secondary | ICD-10-CM | POA: Insufficient documentation

## 2020-08-01 DIAGNOSIS — F17210 Nicotine dependence, cigarettes, uncomplicated: Secondary | ICD-10-CM | POA: Insufficient documentation

## 2020-08-01 DIAGNOSIS — E109 Type 1 diabetes mellitus without complications: Secondary | ICD-10-CM | POA: Insufficient documentation

## 2020-08-01 LAB — URINALYSIS, ROUTINE W REFLEX MICROSCOPIC
Bilirubin Urine: NEGATIVE
Glucose, UA: NEGATIVE mg/dL
Ketones, ur: 20 mg/dL — AB
Nitrite: NEGATIVE
Protein, ur: 100 mg/dL — AB
RBC / HPF: >50 RBC/hpf — ABNORMAL HIGH (ref 0–5)
Specific Gravity, Urine: 1.009 (ref 1.005–1.030)
WBC, UA: >50 WBC/hpf — ABNORMAL HIGH (ref 0–5)
pH: 6 (ref 5.0–8.0)

## 2020-08-01 MED ORDER — POLYETHYLENE GLYCOL 3350 17 GM/SCOOP PO POWD: 17.0000 g | Freq: Every day | ORAL | 0 refills | Status: DC

## 2020-08-01 MED ORDER — CEPHALEXIN 500 MG PO CAPS: 500.0000 mg | ORAL_CAPSULE | Freq: Three times a day (TID) | ORAL | 0 refills | Status: DC

## 2020-08-01 NOTE — ED Provider Notes (Signed)
[facility] DEPT Provider Note   CSN: [id number] Arrival date & time: 08/01/20  0740     History Chief Complaint  Patient presents with  . Urinary Retention    Douglas [name]. is a 70 y.o. male.  The history is provided by the patient and medical records. No language interpreter was used.     70 year old male significant history of recently diagnosed prostate cancer status post insertions of radioactive iodine I-125 seeds into the prostate gland on 07/20/2020 who had a Foley placed for 10 days recently removed approximately 3 days ago presenting complaining of urinary discomfort.  Patient noticed that when he urinates sometimes he would have some difficulty initiating his urine stream and follows with a small trickles of blood but after that the urine is clear.  He feels like he is able to empty his bladder but it takes a while.  He endorsed burning sensation while urinating with some increased frequency.  He does not complain of any fever or chills no nausea vomiting or diarrhea.  He does feel a bit constipated for the past several days not having a bowel movement but able to pass flatus.  No complaints of chest pain shortness of breath or palpitation.  No complaint of back pain or complain of new numbness or new weakness   Past Medical History:  Diagnosis Date  . COPD (chronic obstructive pulmonary disease) (HCC)    no inhalers used  . DM type 1 (diabetes mellitus, type 1) ([place])   . Dyspnea    with heavy exertion  . Glaucoma   . Hypercholesteremia   . Hypertension   . Prostate cancer ([place])   . Wears glasses    for reading    Patient Active Problem List   Diagnosis Date Noted  . Cancer of prostate with intermediate recurrence risk (stage T2b-c or Gleason 7 or PSA 10-20) ([place]) 05/05/2020  . Hypertension 02/08/2013  . Type 1 diabetes mellitus ([place]) 08/02/2011  . PULMONARY NODULE 04/10/2008  . HYPERLIPIDEMIA 03/24/2008  . PNEUMONIA ORGANISM NOS  03/24/2008  . EMPHYSEMA 03/24/2008  . COPD UNSPECIFIED 03/24/2008    Past Surgical History:  Procedure Laterality Date  . COLONOSCOPY N/A 04/05/2013   Procedure: COLONOSCOPY;  Surgeon: Danie Binder, MD;  [facility];  Service: Endoscopy;  Laterality: N/A;  8:30 AM  . CYSTOSCOPY N/A 07/20/2020   Procedure: CYSTOSCOPY;  Surgeon: Cleon Gustin, MD;  Location[facility];  Service: Urology;  Laterality: N/A;  NO SEEDS DETECTED BY DR. Alyson Ingles  . NO PAST SURGERIES    . PROSTATE BIOPSY    . RADIOACTIVE SEED IMPLANT N/A 07/20/2020   Procedure: RADIOACTIVE SEED IMPLANT/BRACHYTHERAPY IMPLANT;  Surgeon: Cleon Gustin, MD;  Location: [facility];  Service: Urology;  Laterality: N/A;  57 SEEDS IMPLANTED  . SPACE OAR INSTILLATION N/A 07/20/2020   Procedure: SPACE OAR INSTILLATION;  Surgeon: Cleon Gustin, MD;  Location: [facility];  Service: Urology;  Laterality: N/A;       Family History  Problem Relation Age of Onset  . Breast cancer Mother   . Colon cancer Neg Hx   . Pancreatic cancer Neg Hx   . Prostate cancer Neg Hx     Social History   Tobacco Use  . Smoking status: Current Every Day Smoker    Packs/day: 1.00    Years: 35.00    Pack years: 35.00    Types: Cigarettes  .  Smokeless tobacco: Never Used  . Tobacco comment: has cut back to 1/2 ppd for several weeks 07-14-2020  Vaping Use  . Vaping Use: Never used  Substance Use Topics  . Alcohol use: No  . Drug use: No    Home Medications Prior to Admission medications   Medication Sig Start Date End Date Taking? Authorizing Provider  atorvastatin (LIPITOR) 40 MG tablet Take 40 mg by mouth daily.    [provider]  brimonidine (ALPHAGAN) 0.2 % ophthalmic solution Place 1 drop into both eyes 2 (two) times daily.    [provider]  Continuous Blood Gluc Receiver (FREESTYLE LIBRE READER) DEVI by Does not apply route. Will be on left arm dos     [provider]  dorzolamide (TRUSOPT) 2 % ophthalmic solution Place 1 drop into both eyes 2 (two) times daily. 07/25/18   [provider]  enalapril (VASOTEC) 10 MG tablet Take 10 mg by mouth daily. 03/21/20   [provider]  fluticasone (FLONASE) 50 MCG/ACT nasal spray Place 2 sprays into both nostrils 2 (two) times daily as needed. 08/09/18   [provider]  hydrochlorothiazide (HYDRODIURIL) 25 MG tablet Take 25 mg by mouth daily.    [provider]  insulin lispro (HUMALOG) 100 UNIT/ML KwikPen Sliding scale 1 to 6 carb ratio 02/21/18   [provider]  LANTUS SOLOSTAR 100 UNIT/ML Solostar Pen Inject 16 Units into the skin at bedtime. 01/14/20   [provider]  sildenafil (VIAGRA) 50 MG tablet TAKE 1 TABLET BY MOUTH AS NEEDED LIMIT TO 1 DAILY. 04/09/20   [provider]  timolol (TIMOPTIC) 0.25 % ophthalmic solution Place into both eyes 2 (two) times daily. 07/25/18   [provider]  traMADol (ULTRAM) 50 MG tablet Take 1 tablet (50 mg total) by mouth every 6 (six) hours as needed. 07/20/20 07/20/21  Cleon Gustin, MD    Allergies    Patient has no known allergies.  Review of Systems   Review of Systems  All other systems reviewed and are negative.   Physical Exam Updated Vital Signs BP (!) 167/74 (BP Location: Right Arm)   Pulse 85   Temp 98 F (36.7 C) (Oral)   Resp 18   SpO2 96%   Physical Exam Vitals and nursing note reviewed.  Constitutional:      General: He is not in acute distress.    Appearance: He is well-developed.  HENT:     Head: Atraumatic.  Eyes:     Conjunctiva/sclera: Conjunctivae normal.  Cardiovascular:     Rate and Rhythm: Normal rate and regular rhythm.     Pulses: Normal pulses.     Heart sounds: Normal heart sounds.  Pulmonary:     Effort: Pulmonary effort is normal.     Breath sounds: Normal breath sounds.  Abdominal:     General: Bowel sounds are normal.      Palpations: Abdomen is soft.     Tenderness: There is no abdominal tenderness.  Genitourinary:    Comments: Chaperone present during exam.  Normal rectal tone, no obvious mass, no blood in stool, no stool impaction, normal prostate. Musculoskeletal:     Cervical back: Neck supple.  Skin:    Findings: No rash.  Neurological:     Mental Status: He is alert.     ED Results / Procedures / Treatments   Labs (all labs ordered are listed, but only abnormal results are displayed) Labs Reviewed  URINALYSIS, [place]  MICROSCOPIC - Abnormal; Notable for the following components:      Result Value   Color, Urine AMBER (*)    APPearance TURBID (*)    Hgb urine dipstick MODERATE (*)    Ketones, ur 20 (*)    Protein, ur 100 (*)    Leukocytes,Ua LARGE (*)    RBC / HPF >50 (*)    WBC, UA >50 (*)    Bacteria, UA RARE (*)    Non Squamous Epithelial 0-5 (*)    All other components within normal limits  URINE CULTURE    EKG None  Radiology No results found.  Procedures Procedures   Medications Ordered in ED Medications - No data to display  ED Course  I have reviewed the triage vital signs and the nursing notes.  Pertinent labs & imaging results that were available during my care of the patient were reviewed by me and considered in my medical decision making (see chart for details).  Clinical Course as of 08/01/20 0959  Sat Aug 01, 2020  0946 Bacteria, UA(!): RARE [SW]    Clinical Course User Index [SW] Vonna Drafts, Student-PA   MDM Rules/Calculators/A&P                          BP (!) 167/74 (BP Location: Right Arm)   Pulse 85   Temp 98 F (36.7 C) (Oral)   Resp 18   SpO2 96%   Final Clinical Impression(s) / ED Diagnoses Final diagnoses:  Lower urinary tract infectious disease    Rx / DC Orders ED Discharge Orders         Ordered    cephALEXin (KEFLEX) 500 MG capsule  3 times daily        08/01/20 1002    polyethylene glycol powder  (GLYCOLAX/MIRALAX) 17 GM/SCOOP powder  Daily        08/01/20 1002         9:29 AM Patient with prostate cancer recently had radioactive seeding into prostate 2 weeks ago had a Foley for 10 days and had to remove 3 days ago.  He is here with urinary discomfort suspicious of developing urinary tract infection.  He is able to empty his bladder, doubt acute urinary retention.  He also endorsed not having bowel movement for the past 2 days but able to pass flatus.  Low suspicion for cauda equina causing his symptoms.  Digital rectal exam without any stool impaction.  Plan to discharge home with MiraLAX and antibiotic.  Urine culture sent.  Will obtain post void residual prior to discharge.  Care discussed with Dr. Joya Gaskins.  9:59 AM Post void residual is 130 cc.  Patient able to urinate.  Plan to discharge home with a prolonged course of Keflex and urine culture sent.  Return precaution given.   Domenic Moras, PA-C 08/01/20 1003    Arnaldo Natal, MD 08/01/20 1620

## 2020-08-01 NOTE — ED Triage Notes (Signed)
Patient here from home reporting hx of prostate cancer. Foley removed on Thursday and implant of seeds. Trouble urinating since Thursday, reporting dribbling "dark blood".

## 2020-08-01 NOTE — Discharge Instructions (Signed)
Please take antibiotic as prescribed as treatment for urinary tract infection.  Take MiraLAX daily to help with constipation.  If you develop difficulty urinating, having significant abdominal pain, unable to void, do not hesitate to return to the ER for further care.

## 2020-08-01 NOTE — ED Notes (Signed)
Pt. Urine and urine culture in the lab. Nurse aware.

## 2020-08-03 ENCOUNTER — Telehealth: Payer: Self-pay

## 2020-08-03 NOTE — Telephone Encounter (Signed)
Pt called saying he had some questions about his cath being removed recently. I returned call. Left message to call back.

## 2020-08-04 ENCOUNTER — Telehealth: Payer: Self-pay

## 2020-08-05 LAB — URINE CULTURE: Culture: >=100000 — AB

## 2020-08-06 ENCOUNTER — Telehealth: Payer: Self-pay | Admitting: *Deleted

## 2020-08-06 NOTE — Progress Notes (Signed)
ED Antimicrobial Stewardship Positive Culture Follow Up   Douglas [name]. is an 70 y.o. male who presented to [facility] on 08/01/2020 with a chief complaint of  Chief Complaint  Patient presents with  . Urinary Retention    Recent Results (from the past 720 hour(s))  SARS CORONAVIRUS 2 (TAT 6-24 HRS) Nasopharyngeal Nasopharyngeal Swab     Status: None   Collection Time: 07/16/20  8:46 AM   Specimen: Nasopharyngeal Swab  Result Value Ref Range Status   SARS Coronavirus 2 NEGATIVE NEGATIVE Final    Comment: (NOTE) SARS-CoV-2 target nucleic acids are NOT DETECTED.  The SARS-CoV-2 RNA is generally detectable in upper and lower respiratory specimens during the acute phase of infection. Negative results do not preclude SARS-CoV-2 infection, do not rule out co-infections with other pathogens, and should not be used as the sole basis for treatment or other patient management decisions. Negative results must be combined with clinical observations, patient history, and epidemiological information. The expected result is Negative.  Fact Sheet for Patients: SugarRoll.be  Fact Sheet for Healthcare Providers: https://www.woods-mathews.com/  This test is not yet approved or cleared by the [place] FDA and  has been authorized for detection and/or diagnosis of SARS-CoV-2 by FDA under an Emergency Use Authorization (EUA). This EUA will remain  in effect (meaning this test can be used) for the duration of the COVID-19 declaration under Se ction 564(b)(1) of the Act, 21 U.S.C. section 360bbb-3(b)(1), unless the authorization is terminated or revoked sooner.  Performed at [facility], [place] [address], [place], [place] [address]   Urine culture     Status: Abnormal   Collection Time: 08/01/20  9:28 AM   Specimen: Urine, Clean Catch  Result Value Ref Range Status   Specimen Description   Final    URINE, CLEAN CATCH Performed at [facility], [place] [address] [place] [address], [place], [place] [address]    Special Requests   Final    NONE Performed at [facility], [place] [address], [place], [place] [address]    Culture (A)  Final    >=100,000 COLONIES/mL MORGANELLA MORGANII 50,000 COLONIES/mL PSEUDOMONAS AERUGINOSA    Report Status 08/05/2020 FINAL  Final   Organism ID, Bacteria MORGANELLA MORGANII (A)  Final   Organism ID, Bacteria PSEUDOMONAS AERUGINOSA (A)  Final      Susceptibility   Morganella morganii - MIC*    AMPICILLIN >=32 RESISTANT Resistant     CEFAZOLIN >=64 RESISTANT Resistant     CIPROFLOXACIN <=0.25 SENSITIVE Sensitive     GENTAMICIN <=1 SENSITIVE Sensitive     IMIPENEM 2 SENSITIVE Sensitive     NITROFURANTOIN 128 RESISTANT Resistant     TRIMETH/SULFA <=20 SENSITIVE Sensitive     AMPICILLIN/SULBACTAM >=32 RESISTANT Resistant     PIP/TAZO <=4 SENSITIVE Sensitive     * >=100,000 COLONIES/mL MORGANELLA MORGANII   Pseudomonas aeruginosa - MIC*    CEFTAZIDIME 4 SENSITIVE Sensitive     CIPROFLOXACIN <=0.25 SENSITIVE Sensitive     GENTAMICIN <=1 SENSITIVE Sensitive     IMIPENEM 1 SENSITIVE Sensitive     PIP/TAZO <=4 SENSITIVE Sensitive     CEFEPIME 2 SENSITIVE Sensitive     * 50,000 COLONIES/mL PSEUDOMONAS AERUGINOSA    [x]  Treated with cephalexin, organism resistant to prescribed antimicrobial  New antibiotic prescription: Ciprofloxacin 500mg  BID x5 days   ED Provider: Krista Blue PA-C   Marliss Czar Soua Lenk 08/06/2020, 8:54 AM Clinical Pharmacist Monday - Friday phone -  [phone] Saturday - Sunday phone - [phone]

## 2020-08-06 NOTE — Telephone Encounter (Signed)
Post ED Visit - Positive Culture Follow-up: Unsuccessful Patient Follow-up  Culture assessed and recommendations reviewed by:  []  Elenor Quinones, Pharm.D. []  Heide Guile, Pharm.D., BCPS AQ-ID []  Parks Neptune, Pharm.D., BCPS []  Alycia Rossetti, Pharm.D., BCPS []  [place], [place].D., BCPS, AAHIVP []  Legrand Como, Pharm.D., BCPS, AAHIVP []  Wynell Balloon, PharmD []  Vincenza Hews, PharmD, BCPS  Positive urine culture  []  Patient discharged without antimicrobial prescription and treatment is now indicated [x]  Organism is resistant to prescribed ED discharge antimicrobial []  Patient with positive blood cultures  Plan:  Stop Cephalexin.  Start Ciprofloxacin 500mg  PO q12 hrs x 5 days, Krista Blue, PA-C  Unable to contact patient after 3 attempts, letter will be sent to address on file  Ardeen Fillers 08/06/2020, 9:08 AM

## 2020-08-06 NOTE — Telephone Encounter (Signed)
Patient called and made aware. Patient states he has about 2-3 days left and will call back if  he doesn't get better.

## 2020-08-10 ENCOUNTER — Telehealth: Payer: Self-pay | Admitting: *Deleted

## 2020-08-10 NOTE — Progress Notes (Signed)
Radiation Oncology         [phone]) [phone] ________________________________  Name: Douglas [name]. MRN: [id number]  Date: 08/11/2020  DOB: [birth year]  Post-Seed Follow-Up Visit Note  CC: [facility], [facility], Candee Furbish, MD  Diagnosis:    70 y.o. gentleman with stage T1c adenocarcinoma of the prostate with a Gleason's score of 3+4 and a PSA of 8.7    ICD-10-CM   1. Cancer of prostate with intermediate recurrence risk (stage T2b-c or Gleason 7 or PSA 10-20) (HCC)  C61     Interval Since Last Radiation:  4 weeks 07/20/20:  Insertion of radioactive I-125 seeds into the prostate gland; 145 Gy, definitive therapy with placement of SpaceOAR gel.  Narrative:  The patient returns today for routine follow-up.  He is complaining of increased urinary frequency and urinary hesitation symptoms. He filled out a questionnaire regarding urinary function today providing and overall IPSS score of 19 characterizing his symptoms as moderate with nocturia x4, weak stream, hesitancy, intermittency, frequency, urgency and mild dysuria.  He developed a UTI just a few days after having his Foley catheter removed and was started on Keflex.  Unfortunately, his culture showed the bacteria to be resistant to Keflex but apparently the office was having a hard time getting in touch with the patient to get his antibiotic changed.  He has continued taking the Keflex as prescribed and reports some improvement in his urinary symptoms but continues with mild dysuria in addition to the above-noted LUTS.  He denies any further gross hematuria, straining to void, incomplete bladder emptying or incontinence.  His pre-implant score was 2. He denies any abdominal pain or bowel symptoms.  He does have modest fatigue but reports that this is gradually improving and he has remained active.  Overall, he is pleased with his progress to date.  ALLERGIES:  has No Known Allergies.  Meds: Current Outpatient Medications  Medication  Sig Dispense Refill  . atorvastatin (LIPITOR) 40 MG tablet Take 40 mg by mouth daily.    . brimonidine (ALPHAGAN) 0.2 % ophthalmic solution Place 1 drop into both eyes 2 (two) times daily.    . cephALEXin (KEFLEX) 500 MG capsule Take 1 capsule (500 mg total) by mouth 3 (three) times daily. 40 capsule 0  . Continuous Blood Gluc Receiver (FREESTYLE LIBRE READER) DEVI by Does not apply route. Will be on left arm dos    . dorzolamide (TRUSOPT) 2 % ophthalmic solution Place 1 drop into both eyes 2 (two) times daily.    . enalapril (VASOTEC) 10 MG tablet Take 10 mg by mouth daily.    . fluticasone (FLONASE) 50 MCG/ACT nasal spray Place 2 sprays into both nostrils 2 (two) times daily as needed.    . hydrochlorothiazide (HYDRODIURIL) 25 MG tablet Take 25 mg by mouth daily.    . insulin lispro (HUMALOG) 100 UNIT/ML KwikPen Sliding scale 1 to 6 carb ratio    . LANTUS SOLOSTAR 100 UNIT/ML Solostar Pen Inject 16 Units into the skin at bedtime.    . polyethylene glycol powder (GLYCOLAX/MIRALAX) 17 GM/SCOOP powder Take 17 g by mouth daily. 255 g 0  . sildenafil (VIAGRA) 50 MG tablet TAKE 1 TABLET BY MOUTH AS NEEDED LIMIT TO 1 DAILY.    Marland Kitchen timolol (TIMOPTIC) 0.25 % ophthalmic solution Place into both eyes 2 (two) times daily.    . traMADol (ULTRAM) 50 MG tablet Take 1 tablet (50 mg total) by mouth every 6 (six) hours as needed. 15  tablet 0   No current facility-administered medications for this visit.    Physical Findings: In general this is a well appearing Caucasian male in no acute distress. He's alert and oriented x4 and appropriate throughout the examination. Cardiopulmonary assessment is negative for acute distress and he exhibits normal effort.   Lab Findings: Lab Results  Component Value Date   WBC 7.6 07/16/2020   HGB 15.3 07/16/2020   HCT 43.9 07/16/2020   MCV 92.6 07/16/2020   PLT 297 07/16/2020    Radiographic Findings:  Patient underwent CT imaging in our clinic for post implant  dosimetry. The CT will be reviewed by Dr. Tammi Klippel to confirm there is an adequate distribution of radioactive seeds throughout the prostate gland and ensure that there are no seeds in or near the rectum.  We suspect the final radiation plan and dosimetry will show appropriate coverage of the prostate gland. He understands that we will call and inform him of any unexpected findings on further review of his imaging and dosimetry.  Impression/Plan:  70 y.o. gentleman with stage T1c adenocarcinoma of the prostate with a Gleason's score of 3+4 and a PSA of 8.7 The patient is recovering from the effects of radiation. His urinary symptoms should gradually improve over the next 4-6 months. We talked about this today.  I have advised him to discontinue the Keflex and I have sent a prescription for Cipro 500 mg twice daily to start today.  He is encouraged by his improvement already and is otherwise pleased with his outcome. We also talked about long-term follow-up for prostate cancer following seed implant. He understands that ongoing PSA determinations and digital rectal exams will help perform surveillance to rule out disease recurrence. He has a follow up appointment scheduled with Dr. Alyson Ingles on 09/04/2020. He understands what to expect with his PSA measures. Patient was also educated today about some of the long-term effects from radiation including a small risk for rectal bleeding and possibly erectile dysfunction. We talked about some of the general management approaches to these potential complications. However, I did encourage the patient to contact our office or return at any point if he has questions or concerns related to his previous radiation and prostate cancer.    Nicholos Johns, PA-C

## 2020-08-10 NOTE — Telephone Encounter (Signed)
Called patient to remind of post seed appts. for 08-11-20, spoke with patient and he is aware of these appts.

## 2020-08-10 NOTE — Progress Notes (Signed)
  Radiation Oncology         [phone]) [phone] ________________________________  Name: Douglas [name]. MRN: [id number]  Date: 08/11/2020  DOB: [birth year]  COMPLEX SIMULATION NOTE  NARRATIVE:  The patient was brought to the [facility] today following prostate seed implantation approximately one month ago.  Identity was confirmed.  All relevant records and images related to the planned course of therapy were reviewed.  Then, the patient was set-up supine.  CT images were obtained.  The CT images were loaded into the planning software.  Then the prostate and rectum were contoured.  Treatment planning then occurred.  The implanted iodine 125 seeds were identified by the physics staff for projection of radiation distribution  I have requested : 3D Simulation  I have requested a DVH of the following structures: Prostate and rectum.    ________________________________  Sheral Apley Tammi Klippel, M.D.

## 2020-08-11 ENCOUNTER — Other Ambulatory Visit: Payer: Self-pay | Admitting: Urology

## 2020-08-11 ENCOUNTER — Encounter: Payer: Self-pay | Admitting: Urology

## 2020-08-11 ENCOUNTER — Other Ambulatory Visit: Payer: Self-pay

## 2020-08-11 ENCOUNTER — Ambulatory Visit
Admission: RE | Admit: 2020-08-11 | Discharge: 2020-08-11 | Disposition: A | Discharge status: Home / Self Care | Payer: Medicare Other | Source: Ambulatory Visit | Attending: Radiation Oncology | Admitting: Radiation Oncology

## 2020-08-11 ENCOUNTER — Ambulatory Visit
Admission: RE | Admit: 2020-08-11 | Discharge: 2020-08-11 | Disposition: A | Discharge status: Home / Self Care | Payer: Medicare Other | Source: Ambulatory Visit | Attending: Urology | Admitting: Urology

## 2020-08-11 VITALS — BP 159/76 | HR 74 | Temp 97.7°F | Resp 18 | Ht 69.0 in | Wt 181.2 lb

## 2020-08-11 DIAGNOSIS — C61 Malignant neoplasm of prostate: Secondary | ICD-10-CM

## 2020-08-11 DIAGNOSIS — Z510 Encounter for antineoplastic radiation therapy: Secondary | ICD-10-CM | POA: Insufficient documentation

## 2020-08-11 MED ORDER — CIPROFLOXACIN HCL 500 MG PO TABS: 500.0000 mg | ORAL_TABLET | Freq: Two times a day (BID) | ORAL | 0 refills | Status: DC

## 2020-08-11 NOTE — Progress Notes (Signed)
Patient is here today for a post -seed follow-up. Patient reports  Dysuria Some Hematuria He was having some blood ,but states that he had a uti . States that he is no longer having any issues Nocturia x3-4 Leakage None Urgency Sometime Emptying bladder  Sometime  Stream Moderate  Push /strain to start stream States that he has to wait for his stream to start,but states that he would not say that he  Is straining Stop /go during urination states that he stop and go about 50% of the time Constipation/Diarrhea None Urinate in 2 hours x1 Next appointment with urologist Has an appointment in  May unsure of date IPPS was a 96 Patient states that he has not taken his blood pressure medication this morning . Denies any s/s of hypertension. Vitals:   08/11/20 0857  BP: (!) 159/76  Pulse: 74  Resp: 18  Temp: 97.7 F (36.5 C)  SpO2: 100%  Weight: 82.2 kg  Height: 5\' 9"  (1.753 m)

## 2020-08-13 ENCOUNTER — Telehealth: Payer: Self-pay | Admitting: *Deleted

## 2020-08-13 NOTE — Telephone Encounter (Signed)
Spoke with patient in response to letter sent to address on file.  Patient states was give prescription for Cipro when seen in follow up at the [facility].

## 2020-08-31 DIAGNOSIS — H40051 Ocular hypertension, right eye: Secondary | ICD-10-CM | POA: Diagnosis not present

## 2020-08-31 DIAGNOSIS — H401131 Primary open-angle glaucoma, bilateral, mild stage: Secondary | ICD-10-CM | POA: Diagnosis not present

## 2020-09-04 ENCOUNTER — Encounter: Payer: Self-pay | Admitting: Urology

## 2020-09-04 ENCOUNTER — Encounter (INDEPENDENT_AMBULATORY_CARE_PROVIDER_SITE_OTHER): Payer: Medicare Other | Admitting: Ophthalmology

## 2020-09-04 ENCOUNTER — Other Ambulatory Visit: Payer: Self-pay

## 2020-09-04 ENCOUNTER — Ambulatory Visit (INDEPENDENT_AMBULATORY_CARE_PROVIDER_SITE_OTHER): Payer: Medicare Other | Admitting: Urology

## 2020-09-04 VITALS — BP 115/71 | HR 73 | Ht 69.0 in | Wt 182.0 lb

## 2020-09-04 DIAGNOSIS — I10 Essential (primary) hypertension: Secondary | ICD-10-CM | POA: Diagnosis not present

## 2020-09-04 DIAGNOSIS — C61 Malignant neoplasm of prostate: Secondary | ICD-10-CM

## 2020-09-04 DIAGNOSIS — H35033 Hypertensive retinopathy, bilateral: Secondary | ICD-10-CM

## 2020-09-04 DIAGNOSIS — R351 Nocturia: Secondary | ICD-10-CM

## 2020-09-04 DIAGNOSIS — H43813 Vitreous degeneration, bilateral: Secondary | ICD-10-CM

## 2020-09-04 DIAGNOSIS — E103513 Type 1 diabetes mellitus with proliferative diabetic retinopathy with macular edema, bilateral: Secondary | ICD-10-CM

## 2020-09-04 NOTE — Progress Notes (Signed)

## 2020-09-04 NOTE — Patient Instructions (Signed)
Prostate Cancer  The prostate is a male gland that helps make semen. It is located below a man's bladder, in front of the rectum. Prostate cancer is when abnormal cells grow in this gland. What are the causes? The cause of this condition is not known. What increases the risk? You are more likely to develop this condition if:  You are 69 years of age or older.  You are African American.  You have a family history of prostate cancer.  You have a family history of breast cancer. What are the signs or symptoms? Symptoms of this condition include:  A need to pee often.  Peeing that is weak, or pee that stops and starts.  Trouble starting or stopping your pee.  Inability to pee.  Blood in your pee or semen.  Pain in the lower back, lower belly (abdomen), hips, or upper thighs.  Trouble getting an erection.  Trouble emptying all of your pee. How is this treated? Treatment for this condition depends on your age, your health, the kind of treatment you like, and how far the cancer has spread. Treatments include:  Being watched. This is called observation. You will be tested from time to time, but you will not get treated. Tests are to make sure that the cancer is not growing.  Surgery. This may be done to remove the prostate, to remove the testicles, or to freeze or kill cancer cells.  Radiation. This uses a strong beam to kill cancer cells.  Ultrasound energy. This uses strong sound waves to kill cancer cells.  Chemotherapy. This uses medicines that stop cancer cells from increasing. This kills cancer cells and healthy cells.  Targeted therapy. This kills cancer cells only. Healthy cells are not affected.  Hormone treatment. This stops the body from making hormones that help the cancer cells to grow. Follow these instructions at home:  Take over-the-counter and prescription medicines only as told by your doctor.  Eat a healthy diet.  Get plenty of sleep.  Ask your  doctor for help to find a support group for men with prostate cancer.  If you have to go to the hospital, let your cancer doctor (oncologist) know.  Treatment may affect your ability to have sex. Touch, hold, hug, and caress your partner to have intimate moments.  Keep all follow-up visits as told by your doctor. This is important. Contact a doctor if:  You have new or more trouble peeing.  You have new or more blood in your pee.  You have new or more pain in your hips, back, or chest. Get help right away if:  You have weakness in your legs.  You lose feeling in your legs.  You cannot control your pee or your poop (stool).  You have chills or a fever. Summary  The prostate is a male gland that helps make semen.  Prostate cancer is when abnormal cells grow in this gland.  Treatment includes doing surgery, using medicines, using very strong beams, or watching without treatment.  Ask your doctor for help to find a support group for men with prostate cancer.  Contact a doctor if you have problems peeing or have any new pain that you did not have before. This information is not intended to replace advice given to you by your health care provider. Make sure you discuss any questions you have with your health care provider. Document Revised: 03/19/2019 Document Reviewed: 03/19/2019 Elsevier Patient Education  2021 Elsevier Inc.  

## 2020-09-04 NOTE — Progress Notes (Signed)
09/04/2020 1:53 PM   Douglas [name]. Jul 13, 1950 [id number]  Referring provider: Alanson Puls [facility] [place],  [place] [address]  Nocturia  HPI: Douglas [name] is a 70yo here for followup for nocturia. He is doing well after brachytherapy on 4/4. Stream is stonrg. Nocturia 3x. He is not on an alpha blocker. He denies urgency but has occasional urinary frequency. No dysuria or hematuria IPSS 6 QOL 0  PMH: Past Medical History:  Diagnosis Date  . COPD (chronic obstructive pulmonary disease) (HCC)    no inhalers used  . DM type 1 (diabetes mellitus, type 1) ([place])   . Dyspnea    with heavy exertion  . Glaucoma   . Hypercholesteremia   . Hypertension   . Prostate cancer ([place])   . Wears glasses    for reading    Surgical History: Past Surgical History:  Procedure Laterality Date  . COLONOSCOPY N/A 04/05/2013   Procedure: COLONOSCOPY;  Surgeon: Danie Binder, MD;  [facility];  Service: Endoscopy;  Laterality: N/A;  8:30 AM  . CYSTOSCOPY N/A 07/20/2020   Procedure: CYSTOSCOPY;  Surgeon: Cleon Gustin, MD;  Location: [facility];  Service: Urology;  Laterality: N/A;  NO SEEDS DETECTED BY DR. Alyson Ingles  . NO PAST SURGERIES    . PROSTATE BIOPSY    . RADIOACTIVE SEED IMPLANT N/A 07/20/2020   Procedure: RADIOACTIVE SEED IMPLANT/BRACHYTHERAPY IMPLANT;  Surgeon: Cleon Gustin, MD;  Location: [facility];  Service: Urology;  Laterality: N/A;  70 SEEDS IMPLANTED  . SPACE OAR INSTILLATION N/A 07/20/2020   Procedure: SPACE OAR INSTILLATION;  Surgeon: Cleon Gustin, MD;  Location: [facility];  Service: Urology;  Laterality: N/A;    Home Medications:  Allergies as of 09/04/2020   No Known Allergies     Medication List       Accurate as of Sep 04, 2020  1:53 PM. If you have any questions, ask your nurse or doctor.        atorvastatin 40 MG tablet Commonly known as: LIPITOR Take 40 mg by mouth  daily.   brimonidine 0.2 % ophthalmic solution Commonly known as: ALPHAGAN Place 1 drop into both eyes 2 (two) times daily.   ciprofloxacin 500 MG tablet Commonly known as: Cipro Take 1 tablet (500 mg total) by mouth 2 (two) times daily.   dorzolamide 2 % ophthalmic solution Commonly known as: TRUSOPT Place 1 drop into both eyes 2 (two) times daily.   enalapril 10 MG tablet Commonly known as: VASOTEC Take 10 mg by mouth daily.   fluticasone 50 MCG/ACT nasal spray Commonly known as: FLONASE Place 2 sprays into both nostrils 2 (two) times daily as needed.   FreeStyle [employer] by Does not apply route. Will be on left arm dos   hydrochlorothiazide 25 MG tablet Commonly known as: HYDRODIURIL Take 25 mg by mouth daily.   insulin lispro 100 UNIT/ML KwikPen Commonly known as: HUMALOG Sliding scale 1 to 6 carb ratio   Lantus SoloStar 100 UNIT/ML Solostar Pen Generic drug: insulin glargine Inject 16 Units into the skin at bedtime.   polyethylene glycol powder 17 GM/SCOOP powder Commonly known as: GLYCOLAX/MIRALAX Take 17 g by mouth daily.   sildenafil 50 MG tablet Commonly known as: VIAGRA TAKE 1 TABLET BY MOUTH AS NEEDED LIMIT TO 1 DAILY.   timolol 0.25 % ophthalmic solution Commonly known as: TIMOPTIC Place into both eyes 2 (two) times  daily.   traMADol 50 MG tablet Commonly known as: Ultram Take 1 tablet (50 mg total) by mouth every 6 (six) hours as needed.       Allergies: No Known Allergies  Family History: Family History  Problem Relation Age of Onset  . Breast cancer Mother   . Colon cancer Neg Hx   . Pancreatic cancer Neg Hx   . Prostate cancer Neg Hx     Social History:  reports that he has been smoking cigarettes. He has a 35.00 pack-year smoking history. He has never used smokeless tobacco. He reports that he does not drink alcohol and does not use drugs.  ROS: All other review of systems were reviewed and are negative except what is  noted above in HPI  Physical Exam: BP 115/71   Pulse 73   Ht 5\' 9"  (1.753 m)   Wt 182 lb (82.6 kg)   BMI 26.88 kg/m   Constitutional:  Alert and oriented, No acute distress. HEENT: [place] AT, moist mucus membranes.  Trachea midline, no masses. Cardiovascular: No clubbing, cyanosis, or edema. Respiratory: Normal respiratory effort, no increased work of breathing. GI: Abdomen is soft, nontender, nondistended, no abdominal masses GU: No CVA tenderness.  Lymph: No cervical or inguinal lymphadenopathy. Skin: No rashes, bruises or suspicious lesions. Neurologic: Grossly intact, no focal deficits, moving all 4 extremities. Psychiatric: Normal mood and affect.  Laboratory Data: Lab Results  Component Value Date   WBC 7.6 07/16/2020   HGB 15.3 07/16/2020   HCT 43.9 07/16/2020   MCV 92.6 07/16/2020   PLT 297 07/16/2020    Lab Results  Component Value Date   CREATININE 0.92 07/16/2020    No results found for: PSA  No results found for: TESTOSTERONE  No results found for: HGBA1C  Urinalysis    Component Value Date/Time   COLORURINE AMBER (A) 08/01/2020 0838   APPEARANCEUR TURBID (A) 08/01/2020 0838   APPEARANCEUR Clear 03/05/2020 1311   LABSPEC 1.009 08/01/2020 0838   PHURINE 6.0 08/01/2020 0838   GLUCOSEU NEGATIVE 08/01/2020 0838   HGBUR MODERATE (A) 08/01/2020 0838   BILIRUBINUR NEGATIVE 08/01/2020 0838   BILIRUBINUR Negative 03/05/2020 1311   KETONESUR 20 (A) 08/01/2020 0838   PROTEINUR 100 (A) 08/01/2020 0838   NITRITE NEGATIVE 08/01/2020 0838   LEUKOCYTESUR LARGE (A) 08/01/2020 0838    Lab Results  Component Value Date   LABMICR Comment 03/05/2020   BACTERIA RARE (A) 08/01/2020    Pertinent Imaging:  No results found for this or any previous visit.  No results found for this or any previous visit.  No results found for this or any previous visit.  No results found for this or any previous visit.  No results found for this or any previous visit.  No  results found for this or any previous visit.  No results found for this or any previous visit.  No results found for this or any previous visit.   Assessment & Plan:    1. Prostate cancer ([place]) RTC 3 months with PSA - Urinalysis, Routine w reflex microscopic  2. Nocturia -Fluid management prior to going to bed. Patient defers alpha blocker therapy at this time   No follow-ups on file.  [name], MD  [facility] [place]

## 2020-09-08 LAB — URINALYSIS, ROUTINE W REFLEX MICROSCOPIC
Bilirubin, UA: NEGATIVE
Glucose, UA: NEGATIVE
Ketones, UA: NEGATIVE
Leukocytes,UA: NEGATIVE
Nitrite, UA: NEGATIVE
Protein,UA: NEGATIVE
Specific Gravity, UA: 1.015 (ref 1.005–1.030)
Urobilinogen, Ur: 0.2 mg/dL (ref 0.2–1.0)
pH, UA: 7 (ref 5.0–7.5)

## 2020-09-08 LAB — MICROSCOPIC EXAMINATION
Bacteria, UA: NONE SEEN
Epithelial Cells (non renal): NONE SEEN /hpf (ref 0–10)
RBC, Urine: NONE SEEN /hpf (ref 0–2)
Renal Epithel, UA: NONE SEEN /hpf
WBC, UA: NONE SEEN /hpf (ref 0–5)

## 2020-09-28 ENCOUNTER — Other Ambulatory Visit: Payer: Medicare Other

## 2020-09-28 ENCOUNTER — Other Ambulatory Visit: Payer: Self-pay

## 2020-09-28 ENCOUNTER — Telehealth: Payer: Self-pay

## 2020-09-28 DIAGNOSIS — C61 Malignant neoplasm of prostate: Secondary | ICD-10-CM | POA: Diagnosis not present

## 2020-09-28 LAB — URINALYSIS, ROUTINE W REFLEX MICROSCOPIC
Bilirubin, UA: NEGATIVE
Glucose, UA: NEGATIVE
Nitrite, UA: POSITIVE — AB
Specific Gravity, UA: 1.02 (ref 1.005–1.030)
Urobilinogen, Ur: 1 mg/dL (ref 0.2–1.0)
pH, UA: 7 (ref 5.0–7.5)

## 2020-09-28 LAB — MICROSCOPIC EXAMINATION
Renal Epithel, UA: NONE SEEN /hpf
WBC, UA: >30 /hpf — AB (ref 0–5)

## 2020-09-28 MED ORDER — NITROFURANTOIN MONOHYD MACRO 100 MG PO CAPS: 100.0000 mg | ORAL_CAPSULE | Freq: Two times a day (BID) | ORAL | 0 refills | Status: DC

## 2020-09-28 NOTE — Telephone Encounter (Signed)
Patient called and made aware of new rx sent to pharmacy.

## 2020-10-01 ENCOUNTER — Ambulatory Visit
Admission: RE | Admit: 2020-10-01 | Discharge: 2020-10-01 | Disposition: A | Discharge status: Home / Self Care | Payer: Medicare Other | Source: Ambulatory Visit | Attending: Radiation Oncology | Admitting: Radiation Oncology

## 2020-10-01 ENCOUNTER — Encounter: Payer: Self-pay | Admitting: Radiation Oncology

## 2020-10-01 DIAGNOSIS — C61 Malignant neoplasm of prostate: Secondary | ICD-10-CM | POA: Diagnosis not present

## 2020-10-01 DIAGNOSIS — Z510 Encounter for antineoplastic radiation therapy: Secondary | ICD-10-CM | POA: Diagnosis not present

## 2020-10-01 LAB — URINE CULTURE

## 2020-10-05 ENCOUNTER — Telehealth: Payer: Self-pay

## 2020-10-05 DIAGNOSIS — R351 Nocturia: Secondary | ICD-10-CM

## 2020-10-05 MED ORDER — CIPROFLOXACIN HCL 500 MG PO TABS: 500.0000 mg | ORAL_TABLET | Freq: Two times a day (BID) | ORAL | 0 refills | Status: DC

## 2020-10-05 NOTE — Telephone Encounter (Signed)
Message left to return call to office. 

## 2020-10-05 NOTE — Telephone Encounter (Signed)
Patient returned call and notified. 

## 2020-10-05 NOTE — Telephone Encounter (Signed)
-----   Message from Cleon Gustin, MD sent at 10/05/2020  7:33 AM EDT ----- Cipro 500mg  BID for 7 days ----- Message ----- From: Dorisann Frames, RN Sent: 10/01/2020  11:45 AM EDT To: Cleon Gustin, MD  Was given macrobid on 06/13  Please advise on new medication

## 2020-10-13 ENCOUNTER — Encounter (INDEPENDENT_AMBULATORY_CARE_PROVIDER_SITE_OTHER): Payer: Medicare Other | Admitting: Ophthalmology

## 2020-10-13 ENCOUNTER — Other Ambulatory Visit: Payer: Self-pay

## 2020-10-13 DIAGNOSIS — E103513 Type 1 diabetes mellitus with proliferative diabetic retinopathy with macular edema, bilateral: Secondary | ICD-10-CM | POA: Diagnosis not present

## 2020-10-13 DIAGNOSIS — I10 Essential (primary) hypertension: Secondary | ICD-10-CM

## 2020-10-13 DIAGNOSIS — H35033 Hypertensive retinopathy, bilateral: Secondary | ICD-10-CM

## 2020-10-13 DIAGNOSIS — H43813 Vitreous degeneration, bilateral: Secondary | ICD-10-CM | POA: Diagnosis not present

## 2020-10-14 NOTE — Progress Notes (Signed)
  Radiation Oncology         [phone]) [phone] ________________________________  Name: Rip Harbour. MRN: [id number]  Date: 10/01/2020  DOB: 1950-12-27  3D Planning Note   Prostate Brachytherapy Post-Implant Dosimetry  Diagnosis: 70 y.o. gentleman with stage T1c adenocarcinoma of the prostate with a Gleason's score of 3+4 and a PSA of 8.7  Narrative: On a previous date, Brice Kossman. returned following prostate seed implantation for post implant planning. He underwent CT scan complex simulation to delineate the three-dimensional structures of the pelvis and demonstrate the radiation distribution.  Since that time, the seed localization, and complex isodose planning with dose volume histograms have now been completed.  Results:   Prostate Coverage - The dose of radiation delivered to the 90% or more of the prostate gland (D90) was 98.11% of the prescription dose. This exceeds our goal of greater than 90%. Rectal Sparing - The volume of rectal tissue receiving the prescription dose or higher was 0.0 cc. This falls under our thresholds tolerance of 1.0 cc.  Impression: The prostate seed implant appears to show adequate target coverage and appropriate rectal sparing.  Plan:  The patient will continue to follow with urology for ongoing PSA determinations. I would anticipate a high likelihood for local tumor control with minimal risk for rectal morbidity.  ________________________________  Sheral Apley Tammi Klippel, M.D.

## 2020-11-17 ENCOUNTER — Other Ambulatory Visit (HOSPITAL_COMMUNITY): Payer: Self-pay | Admitting: Family Medicine

## 2020-11-17 DIAGNOSIS — Z13820 Encounter for screening for osteoporosis: Secondary | ICD-10-CM

## 2020-11-24 ENCOUNTER — Other Ambulatory Visit: Payer: Self-pay

## 2020-11-24 ENCOUNTER — Encounter (INDEPENDENT_AMBULATORY_CARE_PROVIDER_SITE_OTHER): Payer: Medicare Other | Admitting: Ophthalmology

## 2020-11-24 DIAGNOSIS — I10 Essential (primary) hypertension: Secondary | ICD-10-CM

## 2020-11-24 DIAGNOSIS — H35033 Hypertensive retinopathy, bilateral: Secondary | ICD-10-CM

## 2020-11-24 DIAGNOSIS — H43813 Vitreous degeneration, bilateral: Secondary | ICD-10-CM

## 2020-11-24 DIAGNOSIS — E113513 Type 2 diabetes mellitus with proliferative diabetic retinopathy with macular edema, bilateral: Secondary | ICD-10-CM | POA: Diagnosis not present

## 2020-11-24 DIAGNOSIS — H35371 Puckering of macula, right eye: Secondary | ICD-10-CM | POA: Diagnosis not present

## 2020-12-01 DIAGNOSIS — E7800 Pure hypercholesterolemia, unspecified: Secondary | ICD-10-CM | POA: Diagnosis not present

## 2020-12-01 DIAGNOSIS — Z794 Long term (current) use of insulin: Secondary | ICD-10-CM | POA: Diagnosis not present

## 2020-12-01 DIAGNOSIS — I10 Essential (primary) hypertension: Secondary | ICD-10-CM | POA: Diagnosis not present

## 2020-12-01 DIAGNOSIS — E1065 Type 1 diabetes mellitus with hyperglycemia: Secondary | ICD-10-CM | POA: Diagnosis not present

## 2020-12-01 DIAGNOSIS — Z79899 Other long term (current) drug therapy: Secondary | ICD-10-CM | POA: Diagnosis not present

## 2020-12-01 DIAGNOSIS — E10319 Type 1 diabetes mellitus with unspecified diabetic retinopathy without macular edema: Secondary | ICD-10-CM | POA: Diagnosis not present

## 2020-12-01 DIAGNOSIS — C61 Malignant neoplasm of prostate: Secondary | ICD-10-CM | POA: Diagnosis not present

## 2020-12-01 DIAGNOSIS — E1042 Type 1 diabetes mellitus with diabetic polyneuropathy: Secondary | ICD-10-CM | POA: Diagnosis not present

## 2020-12-02 ENCOUNTER — Other Ambulatory Visit: Payer: Medicare Other

## 2020-12-03 ENCOUNTER — Other Ambulatory Visit: Payer: Medicare Other

## 2020-12-03 ENCOUNTER — Other Ambulatory Visit: Payer: Self-pay

## 2020-12-03 DIAGNOSIS — C61 Malignant neoplasm of prostate: Secondary | ICD-10-CM

## 2020-12-04 LAB — PSA: Prostate Specific Ag, Serum: 0.8 ng/mL (ref 0.0–4.0)

## 2020-12-09 ENCOUNTER — Ambulatory Visit: Payer: Medicare Other | Admitting: Urology

## 2020-12-21 ENCOUNTER — Other Ambulatory Visit: Payer: Self-pay

## 2020-12-21 ENCOUNTER — Emergency Department (HOSPITAL_COMMUNITY)
Admission: EM | Admit: 2020-12-21 | Discharge: 2020-12-22 | Disposition: A | Discharge status: Home / Self Care | Payer: Medicare Other | Attending: Emergency Medicine | Admitting: Emergency Medicine

## 2020-12-21 DIAGNOSIS — H5712 Ocular pain, left eye: Secondary | ICD-10-CM | POA: Diagnosis not present

## 2020-12-21 DIAGNOSIS — F17210 Nicotine dependence, cigarettes, uncomplicated: Secondary | ICD-10-CM | POA: Diagnosis not present

## 2020-12-21 DIAGNOSIS — Z79899 Other long term (current) drug therapy: Secondary | ICD-10-CM | POA: Diagnosis not present

## 2020-12-21 DIAGNOSIS — H538 Other visual disturbances: Secondary | ICD-10-CM | POA: Insufficient documentation

## 2020-12-21 DIAGNOSIS — Z8546 Personal history of malignant neoplasm of prostate: Secondary | ICD-10-CM | POA: Insufficient documentation

## 2020-12-21 DIAGNOSIS — Z794 Long term (current) use of insulin: Secondary | ICD-10-CM | POA: Insufficient documentation

## 2020-12-21 DIAGNOSIS — I10 Essential (primary) hypertension: Secondary | ICD-10-CM | POA: Diagnosis not present

## 2020-12-21 DIAGNOSIS — J449 Chronic obstructive pulmonary disease, unspecified: Secondary | ICD-10-CM | POA: Diagnosis not present

## 2020-12-21 DIAGNOSIS — R29818 Other symptoms and signs involving the nervous system: Secondary | ICD-10-CM | POA: Diagnosis not present

## 2020-12-21 DIAGNOSIS — E109 Type 1 diabetes mellitus without complications: Secondary | ICD-10-CM | POA: Diagnosis not present

## 2020-12-21 DIAGNOSIS — I619 Nontraumatic intracerebral hemorrhage, unspecified: Secondary | ICD-10-CM | POA: Diagnosis not present

## 2020-12-21 LAB — CBC WITH DIFFERENTIAL/PLATELET
Abs Immature Granulocytes: 0.01 10*3/uL (ref 0.00–0.07)
Basophils Absolute: 0.1 10*3/uL (ref 0.0–0.1)
Basophils Relative: 2 %
Eosinophils Absolute: 0.2 10*3/uL (ref 0.0–0.5)
Eosinophils Relative: 3 %
HCT: 40 % (ref 39.0–52.0)
Hemoglobin: 13.8 g/dL (ref 13.0–17.0)
Immature Granulocytes: 0 %
Lymphocytes Relative: 19 %
Lymphs Abs: 1.6 10*3/uL (ref 0.7–4.0)
MCH: 32.2 pg (ref 26.0–34.0)
MCHC: 34.5 g/dL (ref 30.0–36.0)
MCV: 93.2 fL (ref 80.0–100.0)
Monocytes Absolute: 0.8 10*3/uL (ref 0.1–1.0)
Monocytes Relative: 9 %
Neutro Abs: 5.8 10*3/uL (ref 1.7–7.7)
Neutrophils Relative %: 67 %
Platelets: 306 10*3/uL (ref 150–400)
RBC: 4.29 MIL/uL (ref 4.22–5.81)
RDW: 12.8 % (ref 11.5–15.5)
WBC: 8.6 10*3/uL (ref 4.0–10.5)
nRBC: 0 % (ref 0.0–0.2)

## 2020-12-21 LAB — COMPREHENSIVE METABOLIC PANEL
ALT: 18 U/L (ref 0–44)
AST: 21 U/L (ref 15–41)
Albumin: 3.4 g/dL — ABNORMAL LOW (ref 3.5–5.0)
Alkaline Phosphatase: 119 U/L (ref 38–126)
Anion gap: 9 (ref 5–15)
BUN: 13 mg/dL (ref 8–23)
CO2: 27 mmol/L (ref 22–32)
Calcium: 9 mg/dL (ref 8.9–10.3)
Chloride: 101 mmol/L (ref 98–111)
Creatinine, Ser: 1.09 mg/dL (ref 0.61–1.24)
GFR, Estimated: >60 mL/min (ref >60)
Glucose, Bld: 311 mg/dL — ABNORMAL HIGH (ref 70–99)
Potassium: 3.8 mmol/L (ref 3.5–5.1)
Sodium: 137 mmol/L (ref 135–145)
Total Bilirubin: 0.7 mg/dL (ref 0.3–1.2)
Total Protein: 6.5 g/dL (ref 6.5–8.1)

## 2020-12-21 MED ORDER — LORAZEPAM 2 MG/ML IJ SOLN
1.0000 mg | Freq: Once | INTRAMUSCULAR | Status: AC | PRN
  Administered 2020-12-22: 1 mg via INTRAVENOUS
  Filled 2020-12-21: qty 1

## 2020-12-21 NOTE — ED Provider Notes (Signed)
Emergency Medicine Provider Triage Evaluation Note  Douglas [name] , a 70 y.o. male  was evaluated in triage.  Pt complains of patient presents today for evaluation of blurred vision in his left eye.  He states that on Saturday he had a sharp pain in the And felt like it was a little bit blurry when he looked to the left, however when he woke up today he states that the eye is blurry.  He denies any fevers.  He currently does not have any eye pain.  He denies any trauma.   Review of Systems  Positive: Left eye blurry vision.   Negative: Fevers, headache  Physical Exam  BP 126/65 (BP Location: Left Arm)   Pulse 75   Temp 98.5 F (36.9 C) (Oral)   Resp 15   Ht '5\' 9"'$  (1.753 m)   Wt 79.4 kg   SpO2 95%   BMI 25.84 kg/m  Gen:   Awake, no distress   Resp:  Normal effort  MSK:   Moves extremities without difficulty  Other:  Patient is able to count fingers with eyes bilaterally.  Limited ability to evaluate pupillary reaction given triage setting.  He has full EOMs bilaterally.  Medical Decision Making  Medically screening exam initiated at 9:06 PM.  Appropriate orders placed.  Douglas [name]. was informed that the remainder of the evaluation will be completed by another provider, this initial triage assessment does not replace that evaluation, and the importance of remaining in the ED until their evaluation is complete.  Note: Portions of this report may have been transcribed using voice recognition software. Every effort was made to ensure accuracy; however, inadvertent computerized transcription errors may be present    Ollen Gross 12/21/20 2108    Dorie Rank, MD 12/22/20 2127

## 2020-12-21 NOTE — ED Triage Notes (Addendum)
Pt c/o blurred vision in his left eye since this morning. Pt reports a sharp pain in his eye on Saturday bet the blurred vision started today. Pt denies headache.

## 2020-12-21 NOTE — ED Provider Notes (Signed)
[facility] EMERGENCY DEPARTMENT Provider Note   CSN: [id number] Arrival date & time: 12/21/20  2035     History Chief Complaint  Patient presents with   Blurred Vision    Douglas [name]. is a 70 y.o. male.  70 year old male who presents to the emerged from today to blurry vision.  Patient states that he had a little bit of left eye pain on Saturday.  States that he had little blurry vision in his temporal peripheral field associated with that pain but it resolved after a few seconds he had no more problems.  He states when he woke up this morning his left eye was blurry.  States that he normally sees the same under both eyes and has not changed throughout the day.  No headache or pain at this time.  No other neurologic changes.  No history of the same.  No trauma.  No recent illnesses.       Past Medical History:  Diagnosis Date   COPD (chronic obstructive pulmonary disease) ([place])    no inhalers used   DM type 1 (diabetes mellitus, type 1) (HCC)    Dyspnea    with heavy exertion   Glaucoma    Hypercholesteremia    Hypertension    Prostate cancer ([place])    Wears glasses    for reading    Patient Active Problem List   Diagnosis Date Noted   Nocturia 09/04/2020   Prostate cancer ([place]) 05/05/2020   Hypertension 02/08/2013   Type 1 diabetes mellitus ([place]) 08/02/2011   PULMONARY NODULE 04/10/2008   HYPERLIPIDEMIA 03/24/2008   PNEUMONIA ORGANISM NOS 03/24/2008   EMPHYSEMA 03/24/2008   COPD UNSPECIFIED 03/24/2008    Past Surgical History:  Procedure Laterality Date   COLONOSCOPY N/A 04/05/2013   Procedure: COLONOSCOPY;  Surgeon: Danie Binder, MD;  [facility];  Service: Endoscopy;  Laterality: N/A;  8:30 AM   CYSTOSCOPY N/A 07/20/2020   Procedure: CYSTOSCOPY;  Surgeon: Cleon Gustin, MD;  Location: [facility];  Service: Urology;  Laterality: N/A;  NO SEEDS DETECTED BY DR. Alyson Ingles   NO PAST SURGERIES     PROSTATE  BIOPSY     RADIOACTIVE SEED IMPLANT N/A 07/20/2020   Procedure: RADIOACTIVE SEED IMPLANT/BRACHYTHERAPY IMPLANT;  Surgeon: Cleon Gustin, MD;  Location: [facility];  Service: Urology;  Laterality: N/A;  54 SEEDS IMPLANTED   SPACE OAR INSTILLATION N/A 07/20/2020   Procedure: SPACE OAR INSTILLATION;  Surgeon: Cleon Gustin, MD;  Location: [facility];  Service: Urology;  Laterality: N/A;       Family History  Problem Relation Age of Onset   Breast cancer Mother    Colon cancer Neg Hx    Pancreatic cancer Neg Hx    Prostate cancer Neg Hx     Social History   Tobacco Use   Smoking status: Every Day    Packs/day: 1.00    Years: 35.00    Pack years: 35.00    Types: Cigarettes   Smokeless tobacco: Never   Tobacco comments:    has cut back to 1/2 ppd for several weeks 07-14-2020  Vaping Use   Vaping Use: Never used  Substance Use Topics   Alcohol use: No   Drug use: No    Home Medications Prior to Admission medications   Medication Sig Start Date End Date Taking? Authorizing Provider  atorvastatin (LIPITOR) 40 MG tablet Take 40 mg by mouth daily.  Yes [provider]  brimonidine (ALPHAGAN) 0.2 % ophthalmic solution Place 1 drop into both eyes 2 (two) times daily.   Yes [provider]  Continuous Blood Gluc Receiver (FREESTYLE LIBRE READER) DEVI by Does not apply route. Will be on left arm dos   Yes [provider]  dorzolamide (TRUSOPT) 2 % ophthalmic solution Place 1 drop into both eyes 2 (two) times daily. 07/25/18  Yes [provider]  enalapril (VASOTEC) 10 MG tablet Take 10 mg by mouth daily. 03/21/20  Yes [provider]  fluticasone (FLONASE) 50 MCG/ACT nasal spray Place 2 sprays into both nostrils 2 (two) times daily as needed for allergies. 08/09/18  Yes [provider]  hydrochlorothiazide (HYDRODIURIL) 25 MG tablet Take 25 mg by mouth daily.   Yes [provider]  insulin  lispro (HUMALOG) 100 UNIT/ML KwikPen Inject 1-6 Units into the skin See admin instructions. Sliding scale 1 to 6 carb ratio 02/21/18  Yes [provider]  LANTUS SOLOSTAR 100 UNIT/ML Solostar Pen Inject 16 Units into the skin at bedtime. 01/14/20  Yes [provider]  Probiotic Product (FLORAJEN3 PO) Take 1 tablet by mouth daily.   Yes [provider]  sildenafil (VIAGRA) 50 MG tablet Take 50 mg by mouth daily as needed for erectile dysfunction. 04/09/20  Yes [provider]  timolol (TIMOPTIC) 0.25 % ophthalmic solution Place into both eyes 2 (two) times daily. 07/25/18  Yes [provider]  traMADol (ULTRAM) 50 MG tablet Take 1 tablet (50 mg total) by mouth every 6 (six) hours as needed. Patient taking differently: Take 50 mg by mouth every 6 (six) hours as needed for moderate pain. 07/20/20 07/20/21 Yes McKenzie, Candee Furbish, MD  ciprofloxacin (CIPRO) 500 MG tablet Take 1 tablet (500 mg total) by mouth 2 (two) times daily. Patient not taking: No sig reported 10/05/20   Cleon Gustin, MD  nitrofurantoin, macrocrystal-monohydrate, (MACROBID) 100 MG capsule Take 1 capsule (100 mg total) by mouth 2 (two) times daily. Patient not taking: No sig reported 09/28/20   Cleon Gustin, MD  polyethylene glycol powder (GLYCOLAX/MIRALAX) 17 GM/SCOOP powder Take 17 g by mouth daily. Patient not taking: No sig reported 08/01/20   Domenic Moras, PA-C    Allergies    Patient has no known allergies.  Review of Systems   Review of Systems  All other systems reviewed and are negative.  Physical Exam Updated Vital Signs BP 118/65   Pulse 61   Temp 98.4 F (36.9 C)   Resp 18   Ht '5\' 9"'$  (1.753 m)   Wt 79.4 kg   SpO2 96%   BMI 25.84 kg/m   Physical Exam Vitals and nursing note reviewed.  Constitutional:      Appearance: He is well-developed.  HENT:     Head: Normocephalic and atraumatic.     Mouth/Throat:     Mouth: Mucous membranes are moist.      Pharynx: Oropharynx is clear.  Eyes:     Pupils: Pupils are equal, round, and reactive to light.  Cardiovascular:     Rate and Rhythm: Normal rate.  Pulmonary:     Effort: Pulmonary effort is normal. No respiratory distress.  Abdominal:     General: Abdomen is flat. There is no distension.  Musculoskeletal:        General: No swelling or tenderness. Normal range of motion.     Cervical back: Normal range of motion.  Skin:    General: Skin is  warm and dry.     Coloration: Skin is not jaundiced or pale.  Neurological:     General: No focal deficit present.     Mental Status: He is alert.     Comments: Diminished vision in L eye, EOM intact, pupillary reflexes intact, no facial asymmetry    ED Results / Procedures / Treatments   Labs (all labs ordered are listed, but only abnormal results are displayed) Labs Reviewed  COMPREHENSIVE METABOLIC PANEL - Abnormal; Notable for the following components:      Result Value   Glucose, Bld 311 (*)    Albumin 3.4 (*)    All other components within normal limits  CBC WITH DIFFERENTIAL/PLATELET    EKG None  Radiology MR BRAIN WO CONTRAST  Result Date: 12/22/2020 CLINICAL DATA:  Initial evaluation for blurry vision, neuro deficit. EXAM: MRI HEAD AND ORBITS WITHOUT AND WITH CONTRAST TECHNIQUE: Multiplanar, multiecho pulse sequences of the brain and surrounding structures were obtained without and with intravenous contrast. Multiplanar, multiecho pulse sequences of the orbits and surrounding structures were obtained including fat saturation techniques, before and after intravenous contrast administration. CONTRAST:  32m GADAVIST GADOBUTROL 1 MMOL/ML IV SOLN COMPARISON:  None. FINDINGS: MRI HEAD FINDINGS Brain: Cerebral volume within normal limits for age. No significant cerebral white matter disease. No abnormal foci of restricted diffusion to suggest acute or subacute ischemia. Gray-white matter differentiation maintained. No encephalomalacia to  suggest chronic cortical infarction. No acute intracranial hemorrhage. Few punctate chronic micro hemorrhages noted involving the bilateral cerebral hemispheres, nonspecific, but of doubtful significance in the acute setting. No mass lesion, midline shift or mass effect. No hydrocephalus or extra-axial fluid collection. Pituitary gland suprasellar region normal. Midline structures intact and normal. Vascular: Major intracranial vascular flow voids are maintained. Skull and upper cervical spine: Craniocervical junction within normal limits. Bone marrow signal intensity normal. No scalp soft tissue abnormality. Other: None. MRI ORBITS FINDINGS Orbits:  Examination degraded by motion artifact. Globes are symmetric in size with normal morphology and appearance bilaterally. Patient status post bilateral ocular lens replacement. No convincing edema or abnormal enhancement seen about either optic nerve on this motion degraded exam to suggest optic neuritis. Intraconal and extraconal fat well-maintained. Extra-ocular muscles symmetric and within normal limits. Lacrimal glands within normal limits. No abnormality about the orbital apices. Optic chiasm normally situated within the suprasellar cistern. No sellar or suprasellar mass. No abnormality seen about the cavernous sinus. Superior orbital veins grossly symmetric and within normal limits. Visualized sinuses: Paranasal sinuses are largely clear. No mastoid effusion. Soft tissues: Unremarkable. IMPRESSION: 1. Motion degraded exam. 2. Grossly normal MRI of the brain and orbits. No convincing evidence for acute optic neuritis. No other acute intracranial abnormality. Electronically Signed   By: BJeannine BogaM.D.   On: 12/22/2020 03:33   MR ORBITS W WO CONTRAST  Result Date: 12/22/2020 CLINICAL DATA:  Initial evaluation for blurry vision, neuro deficit. EXAM: MRI HEAD AND ORBITS WITHOUT AND WITH CONTRAST TECHNIQUE: Multiplanar, multiecho pulse sequences of the  brain and surrounding structures were obtained without and with intravenous contrast. Multiplanar, multiecho pulse sequences of the orbits and surrounding structures were obtained including fat saturation techniques, before and after intravenous contrast administration. CONTRAST:  838mGADAVIST GADOBUTROL 1 MMOL/ML IV SOLN COMPARISON:  None. FINDINGS: MRI HEAD FINDINGS Brain: Cerebral volume within normal limits for age. No significant cerebral white matter disease. No abnormal foci of restricted diffusion to suggest acute or subacute ischemia. Gray-white matter differentiation maintained. No encephalomalacia to suggest  chronic cortical infarction. No acute intracranial hemorrhage. Few punctate chronic micro hemorrhages noted involving the bilateral cerebral hemispheres, nonspecific, but of doubtful significance in the acute setting. No mass lesion, midline shift or mass effect. No hydrocephalus or extra-axial fluid collection. Pituitary gland suprasellar region normal. Midline structures intact and normal. Vascular: Major intracranial vascular flow voids are maintained. Skull and upper cervical spine: Craniocervical junction within normal limits. Bone marrow signal intensity normal. No scalp soft tissue abnormality. Other: None. MRI ORBITS FINDINGS Orbits:  Examination degraded by motion artifact. Globes are symmetric in size with normal morphology and appearance bilaterally. Patient status post bilateral ocular lens replacement. No convincing edema or abnormal enhancement seen about either optic nerve on this motion degraded exam to suggest optic neuritis. Intraconal and extraconal fat well-maintained. Extra-ocular muscles symmetric and within normal limits. Lacrimal glands within normal limits. No abnormality about the orbital apices. Optic chiasm normally situated within the suprasellar cistern. No sellar or suprasellar mass. No abnormality seen about the cavernous sinus. Superior orbital veins grossly symmetric  and within normal limits. Visualized sinuses: Paranasal sinuses are largely clear. No mastoid effusion. Soft tissues: Unremarkable. IMPRESSION: 1. Motion degraded exam. 2. Grossly normal MRI of the brain and orbits. No convincing evidence for acute optic neuritis. No other acute intracranial abnormality. Electronically Signed   By: Jeannine Boga M.D.   On: 12/22/2020 03:33    Procedures Procedures   Medications Ordered in ED Medications  LORazepam (ATIVAN) injection 1 mg (1 mg Intravenous Given 12/22/20 0039)  gadobutrol (GADAVIST) 1 MMOL/ML injection 8 mL (8 mLs Intravenous Contrast Given 12/22/20 0238)    ED Course  I have reviewed the triage vital signs and the nursing notes.  Pertinent labs & imaging results that were available during my care of the patient were reviewed by me and considered in my medical decision making (see chart for details).    MDM Rules/Calculators/A&P                         Eval for stroke vs ocular issues.   No e/o stroke or other acute pahtology. Will dc to fu w/ ophtho for same.   Final Clinical Impression(s) / ED Diagnoses Final diagnoses:  Blurred vision    Rx / DC Orders ED Discharge Orders     None        Zayda Angell, Corene Cornea, MD 12/23/20 [id number]

## 2020-12-22 ENCOUNTER — Emergency Department (HOSPITAL_COMMUNITY): Payer: Medicare Other

## 2020-12-22 DIAGNOSIS — I619 Nontraumatic intracerebral hemorrhage, unspecified: Secondary | ICD-10-CM | POA: Diagnosis not present

## 2020-12-22 DIAGNOSIS — R29818 Other symptoms and signs involving the nervous system: Secondary | ICD-10-CM | POA: Diagnosis not present

## 2020-12-22 DIAGNOSIS — H538 Other visual disturbances: Secondary | ICD-10-CM | POA: Diagnosis not present

## 2020-12-22 MED ORDER — GADOBUTROL 1 MMOL/ML IV SOLN
8.0000 mL | Freq: Once | INTRAVENOUS | Status: AC | PRN
  Administered 2020-12-22: 8 mL via INTRAVENOUS

## 2020-12-22 NOTE — ED Notes (Signed)
Patient transported to MRI 

## 2020-12-23 ENCOUNTER — Other Ambulatory Visit: Payer: Self-pay

## 2020-12-23 ENCOUNTER — Encounter (INDEPENDENT_AMBULATORY_CARE_PROVIDER_SITE_OTHER): Payer: Medicare Other | Admitting: Ophthalmology

## 2020-12-23 DIAGNOSIS — E113513 Type 2 diabetes mellitus with proliferative diabetic retinopathy with macular edema, bilateral: Secondary | ICD-10-CM | POA: Diagnosis not present

## 2020-12-23 DIAGNOSIS — H2702 Aphakia, left eye: Secondary | ICD-10-CM | POA: Diagnosis not present

## 2020-12-23 DIAGNOSIS — I10 Essential (primary) hypertension: Secondary | ICD-10-CM | POA: Diagnosis not present

## 2020-12-23 DIAGNOSIS — H44752 Retained (nonmagnetic) (old) foreign body in vitreous body, left eye: Secondary | ICD-10-CM | POA: Diagnosis not present

## 2020-12-23 DIAGNOSIS — H43813 Vitreous degeneration, bilateral: Secondary | ICD-10-CM

## 2020-12-23 DIAGNOSIS — H35033 Hypertensive retinopathy, bilateral: Secondary | ICD-10-CM

## 2020-12-24 DIAGNOSIS — Z130 Encounter for screening for diseases of the blood and blood-forming organs and certain disorders involving the immune mechanism: Secondary | ICD-10-CM | POA: Diagnosis not present

## 2020-12-24 DIAGNOSIS — I10 Essential (primary) hypertension: Secondary | ICD-10-CM | POA: Diagnosis not present

## 2020-12-24 DIAGNOSIS — Z1329 Encounter for screening for other suspected endocrine disorder: Secondary | ICD-10-CM | POA: Diagnosis not present

## 2020-12-24 DIAGNOSIS — E785 Hyperlipidemia, unspecified: Secondary | ICD-10-CM | POA: Diagnosis not present

## 2020-12-24 NOTE — Progress Notes (Signed)
Sent via mail 

## 2020-12-29 DIAGNOSIS — Z794 Long term (current) use of insulin: Secondary | ICD-10-CM | POA: Diagnosis not present

## 2020-12-29 DIAGNOSIS — E103513 Type 1 diabetes mellitus with proliferative diabetic retinopathy with macular edema, bilateral: Secondary | ICD-10-CM | POA: Diagnosis not present

## 2020-12-29 DIAGNOSIS — I10 Essential (primary) hypertension: Secondary | ICD-10-CM | POA: Diagnosis not present

## 2020-12-29 DIAGNOSIS — E785 Hyperlipidemia, unspecified: Secondary | ICD-10-CM | POA: Diagnosis not present

## 2021-01-05 ENCOUNTER — Other Ambulatory Visit: Payer: Self-pay

## 2021-01-05 ENCOUNTER — Encounter: Payer: Self-pay | Admitting: Urology

## 2021-01-05 ENCOUNTER — Telehealth (INDEPENDENT_AMBULATORY_CARE_PROVIDER_SITE_OTHER): Payer: Medicare Other | Admitting: Urology

## 2021-01-05 DIAGNOSIS — C61 Malignant neoplasm of prostate: Secondary | ICD-10-CM | POA: Diagnosis not present

## 2021-01-05 NOTE — Progress Notes (Signed)
01/05/2021 2:04 PM   Douglas [name]. 10-15-1950 [id number]  Referring provider: Alanson Puls [facility] [address],  [place] [address]  Patient location: home Physician location: office I connected with  Douglas [name]. on 01/05/21 by a video enabled telemedicine application and verified that I am speaking with the correct person using two identifiers.   I discussed the limitations of evaluation and management by telemedicine. The patient expressed understanding and agreed to proceed.    Followup Prostate cancer  HPI: Douglas [name] is a 70yo here for followup for prostate cancer. His PSA decreased to 0.8. His nocturia resolved. Urine stream is strong. No dysuria or hematuria. His fatigue resolved. No other complaints today.    PMH: Past Medical History:  Diagnosis Date   COPD (chronic obstructive pulmonary disease) (HCC)    no inhalers used   DM type 1 (diabetes mellitus, type 1) (HCC)    Dyspnea    with heavy exertion   Glaucoma    Hypercholesteremia    Hypertension    Prostate cancer ([place])    Wears glasses    for reading    Surgical History: Past Surgical History:  Procedure Laterality Date   COLONOSCOPY N/A 04/05/2013   Procedure: COLONOSCOPY;  Surgeon: Danie Binder, MD;  [facility];  Service: Endoscopy;  Laterality: N/A;  8:30 AM   CYSTOSCOPY N/A 07/20/2020   Procedure: CYSTOSCOPY;  Surgeon: Cleon Gustin, MD;  Location: [facility];  Service: Urology;  Laterality: N/A;  NO SEEDS DETECTED BY DR. Alyson Ingles   NO PAST SURGERIES     PROSTATE BIOPSY     RADIOACTIVE SEED IMPLANT N/A 07/20/2020   Procedure: RADIOACTIVE SEED IMPLANT/BRACHYTHERAPY IMPLANT;  Surgeon: Cleon Gustin, MD;  Location: [facility];  Service: Urology;  Laterality: N/A;  54 SEEDS IMPLANTED   SPACE OAR INSTILLATION N/A 07/20/2020   Procedure: SPACE OAR INSTILLATION;  Surgeon: Cleon Gustin, MD;  Location: [facility];   Service: Urology;  Laterality: N/A;    Home Medications:  Allergies as of 01/05/2021   No Known Allergies      Medication List        Accurate as of January 05, 2021  2:04 PM. If you have any questions, ask your nurse or doctor.          atorvastatin 40 MG tablet Commonly known as: LIPITOR Take 40 mg by mouth daily.   brimonidine 0.2 % ophthalmic solution Commonly known as: ALPHAGAN Place 1 drop into both eyes 2 (two) times daily.   ciprofloxacin 500 MG tablet Commonly known as: Cipro Take 1 tablet (500 mg total) by mouth 2 (two) times daily.   dorzolamide 2 % ophthalmic solution Commonly known as: TRUSOPT Place 1 drop into both eyes 2 (two) times daily.   enalapril 10 MG tablet Commonly known as: VASOTEC Take 10 mg by mouth daily.   FLORAJEN3 PO Take 1 tablet by mouth daily.   fluticasone 50 MCG/ACT nasal spray Commonly known as: FLONASE Place 2 sprays into both nostrils 2 (two) times daily as needed for allergies.   FreeStyle [employer] by Does not apply route. Will be on left arm dos   hydrochlorothiazide 25 MG tablet Commonly known as: HYDRODIURIL Take 25 mg by mouth daily.   insulin lispro 100 UNIT/ML KwikPen Commonly known as: HUMALOG Inject 1-6 Units into the skin See admin instructions. Sliding scale 1 to 6 carb ratio  Lantus SoloStar 100 UNIT/ML Solostar Pen Generic drug: insulin glargine Inject 16 Units into the skin at bedtime.   nitrofurantoin (macrocrystal-monohydrate) 100 MG capsule Commonly known as: MACROBID Take 1 capsule (100 mg total) by mouth 2 (two) times daily.   polyethylene glycol powder 17 GM/SCOOP powder Commonly known as: GLYCOLAX/MIRALAX Take 17 g by mouth daily.   sildenafil 50 MG tablet Commonly known as: VIAGRA Take 50 mg by mouth daily as needed for erectile dysfunction.   timolol 0.25 % ophthalmic solution Commonly known as: TIMOPTIC Place into both eyes 2 (two) times daily.   traMADol 50 MG  tablet Commonly known as: Ultram Take 1 tablet (50 mg total) by mouth every 6 (six) hours as needed. What changed: reasons to take this        Allergies: No Known Allergies  Family History: Family History  Problem Relation Age of Onset   Breast cancer Mother    Colon cancer Neg Hx    Pancreatic cancer Neg Hx    Prostate cancer Neg Hx     Social History:  reports that he has been smoking cigarettes. He has a 35.00 pack-year smoking history. He has never used smokeless tobacco. He reports that he does not drink alcohol and does not use drugs.  ROS: All other review of systems were reviewed and are negative except what is noted above in HPI   Laboratory Data: Lab Results  Component Value Date   WBC 8.6 12/21/2020   HGB 13.8 12/21/2020   HCT 40.0 12/21/2020   MCV 93.2 12/21/2020   PLT 306 12/21/2020    Lab Results  Component Value Date   CREATININE 1.09 12/21/2020    No results found for: PSA  No results found for: TESTOSTERONE  No results found for: HGBA1C  Urinalysis    Component Value Date/Time   COLORURINE AMBER (A) 08/01/2020 0838   APPEARANCEUR Cloudy (A) 09/28/2020 1339   LABSPEC 1.009 08/01/2020 0838   PHURINE 6.0 08/01/2020 0838   GLUCOSEU Negative 09/28/2020 1339   HGBUR MODERATE (A) 08/01/2020 0838   BILIRUBINUR Negative 09/28/2020 1339   KETONESUR 20 (A) 08/01/2020 0838   PROTEINUR 3+ (A) 09/28/2020 1339   PROTEINUR 100 (A) 08/01/2020 0838   NITRITE Positive (A) 09/28/2020 1339   NITRITE NEGATIVE 08/01/2020 0838   LEUKOCYTESUR 2+ (A) 09/28/2020 1339   LEUKOCYTESUR LARGE (A) 08/01/2020 0838    Lab Results  Component Value Date   LABMICR See below: 09/28/2020   WBCUA >30 (A) 09/28/2020   LABEPIT 0-10 09/28/2020   MUCUS Present 09/28/2020   BACTERIA Many (A) 09/28/2020    Pertinent Imaging:  No results found for this or any previous visit.  No results found for this or any previous visit.  No results found for this or any previous  visit.  No results found for this or any previous visit.  No results found for this or any previous visit.  No results found for this or any previous visit.  No results found for this or any previous visit.  No results found for this or any previous visit.   Assessment & Plan:    1. Prostate cancer ([place]) -RTC 6 months with PSA    No follow-ups on file.  [name], MD  [facility] [place]

## 2021-01-05 NOTE — Patient Instructions (Signed)
Prostate Cancer °The prostate is a small gland that helps make semen. It is located below a man's bladder, in front of the rectum. Prostate cancer is when abnormal cells grow in this gland. °What are the causes? °The cause of this condition is not known. °What increases the risk? °Being age 69 or older. °Having a family history of prostate cancer. °Having a family history of cancer of the breasts or ovaries. °Having genes that are passed from parent to child (inherited). °Having Lynch syndrome. °African American men and men of African descent are diagnosed with prostate cancer at higher rates than other men. °What are the signs or symptoms? °Problems peeing (urinating). This may include: °A stream that is weak, or pee that stops and starts. °Trouble starting or stopping your pee. °Trouble emptying all of your pee. °Needing to pee more often, especially at night. °Blood in your pee or semen. °Pain in the: °Lower back. °Lower belly (abdomen). °Hips. °Trouble getting an erection. °Weakness or numbness in the legs or feet. °How is this treated? °Treatment for this condition depends on: °How much the cancer has spread. °Your age. °The kind of treatment you want. °Your health. °Treatments include: °Being watched. This is called observation. You will be tested from time to time, but you will not get treated. Tests are to make sure that the cancer is not growing. °Surgery. This may be done to: °Take out (remove) the prostate. °Freeze and kill cancer cells. °Radiation. This uses a strong beam of energy to kill cancer cells. °Chemotherapy. This uses medicines that stop cancer cells from increasing. This kills cancer cells and healthy cells. °Targeted therapy. This kills cancer cells only. Healthy cells are not affected. °Hormone treatment. This stops the body from making hormones that help the cancer cells grow. °Follow these instructions at home: °Lifestyle °Do not smoke or use any products that contain nicotine or tobacco.  If you need help quitting, ask your doctor. °Eat a healthy diet. °Treatment may affect your ability to have sex. If you have a partner, touch, hold, hug, and caress your partner to have intimate moments. °Get plenty of sleep. °Ask your doctor for help to find a support group for men with prostate cancer. °General instructions °Take over-the-counter and prescription medicines only as told by your doctor. °If you have to go to the hospital, let your cancer doctor (oncologist) know. °Keep all follow-up visits. °Where to find more information °American Cancer Society: www.cancer.org °American Society of Clinical Oncology: www.cancer.net °National Cancer Institute: www.cancer.gov °Contact a doctor if: °You have new or more trouble peeing. °You have new or more blood in your pee. °You have new or more pain in your hips, back, or chest. °Get help right away if: °You have weakness in your legs. °You lose feeling in your legs. °You cannot control your pee or your poop (stool). °You have chills or a fever. °Summary °The prostate is a male gland that helps make semen. °Prostate cancer is when abnormal cells grow in this gland. °Treatment includes doing surgery, using medicines, using strong beams of energy, or watching without treatment. °Ask your doctor for help to find a support group for men with prostate cancer. °Contact a doctor if you have problems peeing or have any new pain that you did not have before. °This information is not intended to replace advice given to you by your health care provider. Make sure you discuss any questions you have with your health care provider. °Document Revised: 07/01/2020 Document Reviewed: 07/01/2020 °Elsevier   Patient Education © 2022 [employer] ° °

## 2021-01-08 ENCOUNTER — Other Ambulatory Visit: Payer: Self-pay

## 2021-01-08 ENCOUNTER — Encounter (INDEPENDENT_AMBULATORY_CARE_PROVIDER_SITE_OTHER): Payer: Medicare Other | Admitting: Ophthalmology

## 2021-01-08 DIAGNOSIS — E103513 Type 1 diabetes mellitus with proliferative diabetic retinopathy with macular edema, bilateral: Secondary | ICD-10-CM | POA: Diagnosis not present

## 2021-01-08 DIAGNOSIS — I10 Essential (primary) hypertension: Secondary | ICD-10-CM | POA: Diagnosis not present

## 2021-01-08 DIAGNOSIS — H35033 Hypertensive retinopathy, bilateral: Secondary | ICD-10-CM | POA: Diagnosis not present

## 2021-01-08 DIAGNOSIS — H43813 Vitreous degeneration, bilateral: Secondary | ICD-10-CM | POA: Diagnosis not present

## 2021-01-11 ENCOUNTER — Encounter (INDEPENDENT_AMBULATORY_CARE_PROVIDER_SITE_OTHER): Payer: Medicare Other | Admitting: Ophthalmology

## 2021-01-11 ENCOUNTER — Other Ambulatory Visit: Payer: Self-pay

## 2021-01-11 DIAGNOSIS — H27132 Posterior dislocation of lens, left eye: Secondary | ICD-10-CM

## 2021-01-12 NOTE — H&P (Signed)
Douglas [name]. is an 70 y.o. male.   Chief Complaint:sudden loss of vision left eye HPI: Had cataract surgery OS 11-19-13.  Suddenly lost vision last week.  Found to have intraocular lens in the vitreous left eye  Past Medical History:  Diagnosis Date   COPD (chronic obstructive pulmonary disease) (HCC)    no inhalers used   DM type 1 (diabetes mellitus, type 1) (HCC)    Dyspnea    with heavy exertion   Glaucoma    Hypercholesteremia    Hypertension    Prostate cancer ([place])    Wears glasses    for reading    Past Surgical History:  Procedure Laterality Date   COLONOSCOPY N/A 04/05/2013   Procedure: COLONOSCOPY;  Surgeon: Danie Binder, MD;  [facility];  Service: Endoscopy;  Laterality: N/A;  8:30 AM   CYSTOSCOPY N/A 07/20/2020   Procedure: CYSTOSCOPY;  Surgeon: Cleon Gustin, MD;  Location: [facility];  Service: Urology;  Laterality: N/A;  NO SEEDS DETECTED BY DR. Alyson Ingles   NO PAST SURGERIES     PROSTATE BIOPSY     RADIOACTIVE SEED IMPLANT N/A 07/20/2020   Procedure: RADIOACTIVE SEED IMPLANT/BRACHYTHERAPY IMPLANT;  Surgeon: Cleon Gustin, MD;  Location: [facility];  Service: Urology;  Laterality: N/A;  54 SEEDS IMPLANTED   SPACE OAR INSTILLATION N/A 07/20/2020   Procedure: SPACE OAR INSTILLATION;  Surgeon: Cleon Gustin, MD;  Location: [facility];  Service: Urology;  Laterality: N/A;    Family History  Problem Relation Age of Onset   Breast cancer Mother    Colon cancer Neg Hx    Pancreatic cancer Neg Hx    Prostate cancer Neg Hx    Social History:  reports that he has been smoking cigarettes. He has a 35.00 pack-year smoking history. He has never used smokeless tobacco. He reports that he does not drink alcohol and does not use drugs.  Allergies: No Known Allergies  No medications prior to admission.    Review of systems otherwise negative  There were no vitals taken for this  visit.  Physical exam: Mental status: oriented x3. Eyes: See eye exam associated with this date of surgery in media tab.  Scanned in by scanning center Ears, Nose, Throat: within normal limits Neck: Within Normal limits General: within normal limits Chest: Within normal limits Breast: deferred Heart: Within normal limits Abdomen: Within normal limits GU: deferred Extremities: within normal limits Skin: within normal limits  Assessment/Plan Posterior dislocation of intraocular lens left eye Plan: To [facility] for Pars plana vitrectomy, removal of intraocular lens from the vitreous, laser, placement of secondary intraocular lens with suture, gas injection, left eye  Douglas [name] 01/12/2021, 2:41 PM

## 2021-01-22 ENCOUNTER — Other Ambulatory Visit (HOSPITAL_COMMUNITY)
Admission: RE | Admit: 2021-01-22 | Discharge: 2021-01-22 | Disposition: A | Discharge status: Home / Self Care | Payer: Medicare Other | Source: Ambulatory Visit | Attending: Ophthalmology | Admitting: Ophthalmology

## 2021-01-22 DIAGNOSIS — Z20822 Contact with and (suspected) exposure to covid-19: Secondary | ICD-10-CM | POA: Insufficient documentation

## 2021-01-22 DIAGNOSIS — Z01812 Encounter for preprocedural laboratory examination: Secondary | ICD-10-CM | POA: Diagnosis not present

## 2021-01-22 LAB — SARS CORONAVIRUS 2 (TAT 6-24 HRS): SARS Coronavirus 2: NEGATIVE

## 2021-01-25 ENCOUNTER — Encounter (HOSPITAL_COMMUNITY): Payer: Self-pay | Admitting: Ophthalmology

## 2021-01-25 ENCOUNTER — Other Ambulatory Visit: Payer: Self-pay

## 2021-01-25 NOTE — Progress Notes (Addendum)
Douglas [name] denies chest pain or shortness of breath. Patient denies having any s/s of Covid in his household.  Patient denies any known exposure to Covid.   Douglas [name] has type I diabetes.  Patient reports that CBG's today have ran 110-250. Patient states that last last A1C was 8.0- patient's Endrocrinologist is Dr. Gae Gallop. .I instructed patient to check CBG after awaking and every 2 hours until arrival  to the hospital.  I Instructed patient if CBG is less than 70 to take 4 Glucose Tablets or 1 tube of Glucose Gel or 1/2 cup of a clear juice. Recheck CBG in 15 minutes if CBG is not over 70 call, pre- op desk at [phone] for further instructions.   PCP is with [facility] in [place].  I instructed patient to shower with antibiotic soap, if it is available.  Dry off with a clean towel. Do not put lotion, powder, cologne or deodorant or makeup.No jewelry or piercings. Men may shave their face and neck. Woman should not shave. No nail polish, artificial or acrylic nails. Wear clean clothes, brush your teeth. Glasses, contact lens,dentures or partials may not be worn in the OR. If you need to wear them, please bring a case for glasses, do not wear contacts or bring a case, the hospital does not have contact cases, dentures or partials will have to be removed , make sure they are clean, we will provide a denture cup to put them in. You will need some one to drive you home and a responsible person over the age of 87 to stay with you for the first 24 hours after surgery.

## 2021-01-26 ENCOUNTER — Ambulatory Visit (HOSPITAL_COMMUNITY): Payer: Medicare Other | Admitting: Anesthesiology

## 2021-01-26 ENCOUNTER — Other Ambulatory Visit: Payer: Self-pay

## 2021-01-26 ENCOUNTER — Encounter (HOSPITAL_COMMUNITY): Payer: Self-pay | Admitting: Ophthalmology

## 2021-01-26 ENCOUNTER — Encounter (HOSPITAL_COMMUNITY)
Admission: RE | Disposition: A | Discharge status: Home / Self Care | Payer: Self-pay | Source: Home / Self Care | Attending: Ophthalmology

## 2021-01-26 ENCOUNTER — Ambulatory Visit (HOSPITAL_COMMUNITY)
Admission: RE | Admit: 2021-01-26 | Discharge: 2021-01-27 | Disposition: A | Discharge status: Home / Self Care | Payer: Medicare Other | Attending: Ophthalmology | Admitting: Ophthalmology

## 2021-01-26 DIAGNOSIS — H5462 Unqualified visual loss, left eye, normal vision right eye: Secondary | ICD-10-CM | POA: Diagnosis not present

## 2021-01-26 DIAGNOSIS — Z794 Long term (current) use of insulin: Secondary | ICD-10-CM | POA: Insufficient documentation

## 2021-01-26 DIAGNOSIS — I10 Essential (primary) hypertension: Secondary | ICD-10-CM | POA: Diagnosis not present

## 2021-01-26 DIAGNOSIS — F17210 Nicotine dependence, cigarettes, uncomplicated: Secondary | ICD-10-CM | POA: Insufficient documentation

## 2021-01-26 DIAGNOSIS — E119 Type 2 diabetes mellitus without complications: Secondary | ICD-10-CM | POA: Diagnosis not present

## 2021-01-26 DIAGNOSIS — T8522XA Displacement of intraocular lens, initial encounter: Secondary | ICD-10-CM | POA: Insufficient documentation

## 2021-01-26 DIAGNOSIS — H44752 Retained (nonmagnetic) (old) foreign body in vitreous body, left eye: Secondary | ICD-10-CM | POA: Diagnosis not present

## 2021-01-26 DIAGNOSIS — C61 Malignant neoplasm of prostate: Secondary | ICD-10-CM | POA: Diagnosis not present

## 2021-01-26 DIAGNOSIS — Y772 Prosthetic and other implants, materials and accessory ophthalmic devices associated with adverse incidents: Secondary | ICD-10-CM | POA: Diagnosis not present

## 2021-01-26 DIAGNOSIS — E785 Hyperlipidemia, unspecified: Secondary | ICD-10-CM | POA: Diagnosis not present

## 2021-01-26 DIAGNOSIS — H27132 Posterior dislocation of lens, left eye: Secondary | ICD-10-CM | POA: Diagnosis not present

## 2021-01-26 HISTORY — PX: PARS PLANA VITRECTOMY: SHX2166

## 2021-01-26 HISTORY — PX: AIR/FLUID EXCHANGE: SHX6494

## 2021-01-26 HISTORY — PX: PLACEMENT AND SUTURE OF SECONDARY INTRAOCULAR LENS: SHX5338

## 2021-01-26 HISTORY — PX: INTRAOCULAR LENS REMOVAL: SHX5339

## 2021-01-26 LAB — CBC
HCT: 41.5 % (ref 39.0–52.0)
Hemoglobin: 14.7 g/dL (ref 13.0–17.0)
MCH: 33 pg (ref 26.0–34.0)
MCHC: 35.4 g/dL (ref 30.0–36.0)
MCV: 93.3 fL (ref 80.0–100.0)
Platelets: 295 10*3/uL (ref 150–400)
RBC: 4.45 MIL/uL (ref 4.22–5.81)
RDW: 12.7 % (ref 11.5–15.5)
WBC: 9.2 10*3/uL (ref 4.0–10.5)
nRBC: 0 % (ref 0.0–0.2)

## 2021-01-26 LAB — BASIC METABOLIC PANEL
Anion gap: 9 (ref 5–15)
BUN: 16 mg/dL (ref 8–23)
CO2: 25 mmol/L (ref 22–32)
Calcium: 8.9 mg/dL (ref 8.9–10.3)
Chloride: 101 mmol/L (ref 98–111)
Creatinine, Ser: 0.92 mg/dL (ref 0.61–1.24)
GFR, Estimated: >60 mL/min (ref >60)
Glucose, Bld: 230 mg/dL — ABNORMAL HIGH (ref 70–99)
Potassium: 4.2 mmol/L (ref 3.5–5.1)
Sodium: 135 mmol/L (ref 135–145)

## 2021-01-26 LAB — GLUCOSE, CAPILLARY
Glucose-Capillary: 205 mg/dL — ABNORMAL HIGH (ref 70–99)
Glucose-Capillary: 258 mg/dL — ABNORMAL HIGH (ref 70–99)
Glucose-Capillary: 265 mg/dL — ABNORMAL HIGH (ref 70–99)
Glucose-Capillary: 159 mg/dL — ABNORMAL HIGH (ref 70–99)
Glucose-Capillary: 178 mg/dL — ABNORMAL HIGH (ref 70–99)
Glucose-Capillary: 269 mg/dL — ABNORMAL HIGH (ref 70–99)

## 2021-01-26 LAB — HEMOGLOBIN A1C
Hgb A1c MFr Bld: 7.9 % — ABNORMAL HIGH (ref 4.8–5.6)
Mean Plasma Glucose: 180.03 mg/dL

## 2021-01-26 SURGERY — PARS PLANA VITRECTOMY WITH 25G REMOVAL/SUTURE INTRAOCULAR LENS
Anesthesia: General | Site: Eye | Laterality: Left

## 2021-01-26 MED ORDER — SODIUM HYALURONATE 10 MG/ML IO SOLUTION
PREFILLED_SYRINGE | INTRAOCULAR | Status: DC | PRN
  Administered 2021-01-26: 0.85 mL via INTRAOCULAR

## 2021-01-26 MED ORDER — ONDANSETRON HCL 4 MG/2ML IJ SOLN
INTRAMUSCULAR | Status: DC | PRN
  Administered 2021-01-26: 4 mg via INTRAVENOUS

## 2021-01-26 MED ORDER — TEMAZEPAM 15 MG PO CAPS: 15.0000 mg | ORAL_CAPSULE | Freq: Every evening | ORAL | Status: DC | PRN

## 2021-01-26 MED ORDER — INSULIN ASPART 100 UNIT/ML IJ SOLN
0.0000 [IU] | Freq: Every day | INTRAMUSCULAR | Status: DC
  Administered 2021-01-26: 3 [IU] via SUBCUTANEOUS

## 2021-01-26 MED ORDER — MIDAZOLAM HCL 5 MG/5ML IJ SOLN
INTRAMUSCULAR | Status: DC | PRN
  Administered 2021-01-26: 2 mg via INTRAVENOUS

## 2021-01-26 MED ORDER — ROCURONIUM BROMIDE 10 MG/ML (PF) SYRINGE
PREFILLED_SYRINGE | INTRAVENOUS | Status: AC
  Filled 2021-01-26: qty 10

## 2021-01-26 MED ORDER — LIDOCAINE HCL 2 % IJ SOLN
INTRAMUSCULAR | Status: AC
  Filled 2021-01-26: qty 20

## 2021-01-26 MED ORDER — FENTANYL CITRATE (PF) 100 MCG/2ML IJ SOLN: 25.0000 ug | INTRAMUSCULAR | Status: DC | PRN

## 2021-01-26 MED ORDER — PROPOFOL 10 MG/ML IV BOLUS
INTRAVENOUS | Status: DC | PRN
  Administered 2021-01-26: 200 mg via INTRAVENOUS

## 2021-01-26 MED ORDER — HYDROCODONE-ACETAMINOPHEN 5-325 MG PO TABS
1.0000 | ORAL_TABLET | ORAL | Status: DC | PRN
  Administered 2021-01-26 – 2021-01-27 (×3): 1 via ORAL
  Filled 2021-01-26 (×3): qty 1

## 2021-01-26 MED ORDER — FENTANYL CITRATE (PF) 250 MCG/5ML IJ SOLN
INTRAMUSCULAR | Status: AC
  Filled 2021-01-26: qty 5

## 2021-01-26 MED ORDER — BRIMONIDINE TARTRATE 0.2 % OP SOLN: 1.0000 [drp] | Freq: Two times a day (BID) | OPHTHALMIC | Status: DC

## 2021-01-26 MED ORDER — PHENYLEPHRINE HCL-NACL 20-0.9 MG/250ML-% IV SOLN
INTRAVENOUS | Status: DC | PRN
  Administered 2021-01-26: 25 ug/min via INTRAVENOUS

## 2021-01-26 MED ORDER — EPINEPHRINE PF 1 MG/ML IJ SOLN
INTRAMUSCULAR | Status: AC
  Filled 2021-01-26: qty 1

## 2021-01-26 MED ORDER — INSULIN ASPART 100 UNIT/ML IJ SOLN
INTRAMUSCULAR | Status: AC
  Filled 2021-01-26: qty 1

## 2021-01-26 MED ORDER — HYDROCHLOROTHIAZIDE 25 MG PO TABS
25.0000 mg | ORAL_TABLET | Freq: Every day | ORAL | Status: DC
  Administered 2021-01-26: 25 mg via ORAL
  Filled 2021-01-26: qty 1

## 2021-01-26 MED ORDER — BACITRACIN-POLYMYXIN B 500-10000 UNIT/GM OP OINT
TOPICAL_OINTMENT | OPHTHALMIC | Status: AC
  Filled 2021-01-26: qty 3.5

## 2021-01-26 MED ORDER — BSS IO SOLN
INTRAOCULAR | Status: DC | PRN
  Administered 2021-01-26: 15 mL via INTRAOCULAR

## 2021-01-26 MED ORDER — ACETAZOLAMIDE SODIUM 500 MG IJ SOLR
INTRAMUSCULAR | Status: AC
  Filled 2021-01-26: qty 500

## 2021-01-26 MED ORDER — ACETAMINOPHEN 160 MG/5ML PO SOLN: 1000.0000 mg | Freq: Once | ORAL | Status: DC | PRN

## 2021-01-26 MED ORDER — TRIAMCINOLONE ACETONIDE 40 MG/ML IJ SUSP
INTRAMUSCULAR | Status: AC
  Filled 2021-01-26: qty 5

## 2021-01-26 MED ORDER — SODIUM HYALURONATE 10 MG/ML IO SOLUTION
PREFILLED_SYRINGE | INTRAOCULAR | Status: AC
  Filled 2021-01-26: qty 0.85

## 2021-01-26 MED ORDER — DORZOLAMIDE HCL 2 % OP SOLN: 1.0000 [drp] | Freq: Three times a day (TID) | OPHTHALMIC | Status: DC

## 2021-01-26 MED ORDER — PROPOFOL 10 MG/ML IV BOLUS
INTRAVENOUS | Status: AC
  Filled 2021-01-26: qty 20

## 2021-01-26 MED ORDER — ACETAZOLAMIDE SODIUM 500 MG IJ SOLR
500.0000 mg | Freq: Once | INTRAMUSCULAR | Status: AC
  Administered 2021-01-27: 500 mg via INTRAVENOUS
  Filled 2021-01-26: qty 500

## 2021-01-26 MED ORDER — STERILE WATER FOR INJECTION IJ SOLN
INTRAMUSCULAR | Status: AC
  Filled 2021-01-26: qty 20

## 2021-01-26 MED ORDER — BSS PLUS IO SOLN
INTRAOCULAR | Status: AC
  Filled 2021-01-26: qty 500

## 2021-01-26 MED ORDER — ACETAMINOPHEN 500 MG PO TABS: 1000.0000 mg | ORAL_TABLET | Freq: Once | ORAL | Status: DC | PRN

## 2021-01-26 MED ORDER — STERILE WATER FOR INJECTION IJ SOLN
INTRAMUSCULAR | Status: DC | PRN
  Administered 2021-01-26: 400 mL

## 2021-01-26 MED ORDER — CYCLOPENTOLATE HCL 1 % OP SOLN
1.0000 [drp] | OPHTHALMIC | Status: AC | PRN
  Administered 2021-01-26 (×3): 1 [drp] via OPHTHALMIC
  Filled 2021-01-26: qty 2

## 2021-01-26 MED ORDER — BRIMONIDINE TARTRATE 0.15 % OP SOLN
OPHTHALMIC | Status: AC
  Filled 2021-01-26: qty 5

## 2021-01-26 MED ORDER — INSULIN ASPART 100 UNIT/ML IJ SOLN: 8.0000 [IU] | Freq: Once | INTRAMUSCULAR | Status: DC

## 2021-01-26 MED ORDER — ACETAMINOPHEN 325 MG PO TABS
325.0000 mg | ORAL_TABLET | ORAL | Status: DC | PRN
  Administered 2021-01-27: 650 mg via ORAL
  Filled 2021-01-26: qty 2

## 2021-01-26 MED ORDER — 0.9 % SODIUM CHLORIDE (POUR BTL) OPTIME
TOPICAL | Status: DC | PRN
  Administered 2021-01-26: 200 mL

## 2021-01-26 MED ORDER — CEFAZOLIN SODIUM-DEXTROSE 2-4 GM/100ML-% IV SOLN
2.0000 g | INTRAVENOUS | Status: AC
  Administered 2021-01-26: 2 g via INTRAVENOUS
  Filled 2021-01-26: qty 100

## 2021-01-26 MED ORDER — SODIUM CHLORIDE (PF) 0.9 % IJ SOLN
INTRAMUSCULAR | Status: AC
  Filled 2021-01-26: qty 10

## 2021-01-26 MED ORDER — SODIUM CHLORIDE 0.9 % IV SOLN: INTRAVENOUS | Status: DC

## 2021-01-26 MED ORDER — TETRACAINE HCL 0.5 % OP SOLN
2.0000 [drp] | Freq: Once | OPHTHALMIC | Status: DC
  Filled 2021-01-26: qty 4

## 2021-01-26 MED ORDER — MIDAZOLAM HCL 2 MG/2ML IJ SOLN
INTRAMUSCULAR | Status: AC
  Filled 2021-01-26: qty 2

## 2021-01-26 MED ORDER — SILDENAFIL CITRATE 50 MG PO TABS: 50.0000 mg | ORAL_TABLET | Freq: Every day | ORAL | Status: DC | PRN

## 2021-01-26 MED ORDER — OXYCODONE HCL 5 MG/5ML PO SOLN: 5.0000 mg | Freq: Once | ORAL | Status: DC | PRN

## 2021-01-26 MED ORDER — TIMOLOL MALEATE 0.25 % OP SOLN
1.0000 [drp] | Freq: Two times a day (BID) | OPHTHALMIC | Status: DC
  Filled 2021-01-26: qty 5

## 2021-01-26 MED ORDER — EPHEDRINE SULFATE-NACL 50-0.9 MG/10ML-% IV SOSY
PREFILLED_SYRINGE | INTRAVENOUS | Status: DC | PRN
  Administered 2021-01-26: 5 mg via INTRAVENOUS

## 2021-01-26 MED ORDER — OXYCODONE HCL 5 MG PO TABS: 5.0000 mg | ORAL_TABLET | Freq: Once | ORAL | Status: DC | PRN

## 2021-01-26 MED ORDER — MAGNESIUM HYDROXIDE 400 MG/5ML PO SUSP: 15.0000 mL | Freq: Four times a day (QID) | ORAL | Status: DC | PRN

## 2021-01-26 MED ORDER — PHENYLEPHRINE 40 MCG/ML (10ML) SYRINGE FOR IV PUSH (FOR BLOOD PRESSURE SUPPORT)
PREFILLED_SYRINGE | INTRAVENOUS | Status: AC
  Filled 2021-01-26: qty 10

## 2021-01-26 MED ORDER — HEMOSTATIC AGENTS (NO CHARGE) OPTIME
TOPICAL | Status: DC | PRN
  Administered 2021-01-26: 1 via TOPICAL

## 2021-01-26 MED ORDER — INSULIN GLARGINE-YFGN 100 UNIT/ML ~~LOC~~ SOLN
16.0000 [IU] | Freq: Every day | SUBCUTANEOUS | Status: DC
  Administered 2021-01-26: 16 [IU] via SUBCUTANEOUS
  Filled 2021-01-26 (×2): qty 0.16

## 2021-01-26 MED ORDER — LATANOPROST 0.005 % OP SOLN
1.0000 [drp] | Freq: Every day | OPHTHALMIC | Status: DC
  Filled 2021-01-26: qty 2.5

## 2021-01-26 MED ORDER — ATORVASTATIN CALCIUM 40 MG PO TABS
40.0000 mg | ORAL_TABLET | Freq: Every day | ORAL | Status: DC
  Administered 2021-01-26: 40 mg via ORAL
  Filled 2021-01-26: qty 1

## 2021-01-26 MED ORDER — EPHEDRINE 5 MG/ML INJ
INTRAVENOUS | Status: AC
  Filled 2021-01-26: qty 5

## 2021-01-26 MED ORDER — ENALAPRIL MALEATE 5 MG PO TABS
10.0000 mg | ORAL_TABLET | Freq: Every day | ORAL | Status: DC
  Administered 2021-01-26: 10 mg via ORAL
  Filled 2021-01-26 (×2): qty 2

## 2021-01-26 MED ORDER — BUPIVACAINE HCL (PF) 0.75 % IJ SOLN
INTRAMUSCULAR | Status: DC | PRN
  Administered 2021-01-26: 10 mL

## 2021-01-26 MED ORDER — INSULIN LISPRO (1 UNIT DIAL) 100 UNIT/ML (KWIKPEN): 1.0000 [IU] | PEN_INJECTOR | SUBCUTANEOUS | Status: DC

## 2021-01-26 MED ORDER — BRIMONIDINE TARTRATE 0.2 % OP SOLN
1.0000 [drp] | Freq: Two times a day (BID) | OPHTHALMIC | Status: DC
  Filled 2021-01-26: qty 5

## 2021-01-26 MED ORDER — DEXAMETHASONE SODIUM PHOSPHATE 10 MG/ML IJ SOLN
INTRAMUSCULAR | Status: AC
  Filled 2021-01-26: qty 1

## 2021-01-26 MED ORDER — DORZOLAMIDE HCL 2 % OP SOLN
1.0000 [drp] | Freq: Two times a day (BID) | OPHTHALMIC | Status: DC
  Filled 2021-01-26: qty 10

## 2021-01-26 MED ORDER — CEFTAZIDIME 1 G IJ SOLR
INTRAMUSCULAR | Status: AC
  Filled 2021-01-26: qty 1

## 2021-01-26 MED ORDER — BACITRACIN-POLYMYXIN B 500-10000 UNIT/GM OP OINT
1.0000 "application " | TOPICAL_OINTMENT | Freq: Three times a day (TID) | OPHTHALMIC | Status: DC
  Filled 2021-01-26: qty 3.5

## 2021-01-26 MED ORDER — SUGAMMADEX SODIUM 200 MG/2ML IV SOLN
INTRAVENOUS | Status: DC | PRN
  Administered 2021-01-26: 150 mg via INTRAVENOUS

## 2021-01-26 MED ORDER — SODIUM CHLORIDE 0.45 % IV SOLN: INTRAVENOUS | Status: DC

## 2021-01-26 MED ORDER — ONDANSETRON HCL 4 MG/2ML IJ SOLN
4.0000 mg | Freq: Four times a day (QID) | INTRAMUSCULAR | Status: DC
  Administered 2021-01-26 – 2021-01-27 (×3): 4 mg via INTRAVENOUS
  Filled 2021-01-26 (×3): qty 2

## 2021-01-26 MED ORDER — BACITRACIN-POLYMYXIN B 500-10000 UNIT/GM OP OINT
TOPICAL_OINTMENT | OPHTHALMIC | Status: DC | PRN
  Administered 2021-01-26: 1 via OPHTHALMIC

## 2021-01-26 MED ORDER — TROPICAMIDE 1 % OP SOLN
1.0000 [drp] | OPHTHALMIC | Status: AC | PRN
  Administered 2021-01-26 (×3): 1 [drp] via OPHTHALMIC
  Filled 2021-01-26: qty 15

## 2021-01-26 MED ORDER — ROCURONIUM BROMIDE 10 MG/ML (PF) SYRINGE
PREFILLED_SYRINGE | INTRAVENOUS | Status: DC | PRN
Start: 2021-01-26 — End: 2021-01-26
  Administered 2021-01-26: 50 mg via INTRAVENOUS
  Administered 2021-01-26: 30 mg via INTRAVENOUS

## 2021-01-26 MED ORDER — GATIFLOXACIN 0.5 % OP SOLN
1.0000 [drp] | Freq: Four times a day (QID) | OPHTHALMIC | Status: DC
  Filled 2021-01-26: qty 2.5

## 2021-01-26 MED ORDER — PHENYLEPHRINE HCL 2.5 % OP SOLN
1.0000 [drp] | OPHTHALMIC | Status: AC | PRN
  Administered 2021-01-26 (×3): 1 [drp] via OPHTHALMIC
  Filled 2021-01-26: qty 2

## 2021-01-26 MED ORDER — DEXAMETHASONE SODIUM PHOSPHATE 10 MG/ML IJ SOLN
INTRAMUSCULAR | Status: DC | PRN
  Administered 2021-01-26: 10 mg

## 2021-01-26 MED ORDER — CHLORHEXIDINE GLUCONATE 0.12 % MT SOLN
15.0000 mL | Freq: Once | OROMUCOSAL | Status: AC
  Administered 2021-01-26: 15 mL via OROMUCOSAL
  Filled 2021-01-26: qty 15

## 2021-01-26 MED ORDER — LIDOCAINE 2% (20 MG/ML) 5 ML SYRINGE
INTRAMUSCULAR | Status: AC
  Filled 2021-01-26: qty 5

## 2021-01-26 MED ORDER — INSULIN ASPART 100 UNIT/ML IJ SOLN
0.0000 [IU] | Freq: Three times a day (TID) | INTRAMUSCULAR | Status: DC
  Administered 2021-01-26: 1 [IU] via SUBCUTANEOUS
  Administered 2021-01-27: 3 [IU] via SUBCUTANEOUS

## 2021-01-26 MED ORDER — FENTANYL CITRATE (PF) 250 MCG/5ML IJ SOLN
INTRAMUSCULAR | Status: DC | PRN
  Administered 2021-01-26: 100 ug via INTRAVENOUS

## 2021-01-26 MED ORDER — STERILE WATER FOR INJECTION IJ SOLN
INTRAMUSCULAR | Status: DC | PRN
  Administered 2021-01-26: 20 mL

## 2021-01-26 MED ORDER — SACCHAROMYCES BOULARDII 250 MG PO CAPS
250.0000 mg | ORAL_CAPSULE | Freq: Every day | ORAL | Status: DC
  Administered 2021-01-26: 250 mg via ORAL
  Filled 2021-01-26: qty 1

## 2021-01-26 MED ORDER — FLUTICASONE PROPIONATE 50 MCG/ACT NA SUSP: 2.0000 | Freq: Two times a day (BID) | NASAL | Status: DC | PRN

## 2021-01-26 MED ORDER — POLYMYXIN B SULFATE 500000 UNITS IJ SOLR
INTRAMUSCULAR | Status: AC
  Filled 2021-01-26: qty 500000

## 2021-01-26 MED ORDER — BUPIVACAINE HCL (PF) 0.75 % IJ SOLN
INTRAMUSCULAR | Status: AC
  Filled 2021-01-26: qty 10

## 2021-01-26 MED ORDER — GATIFLOXACIN 0.5 % OP SOLN
1.0000 [drp] | OPHTHALMIC | Status: AC | PRN
  Administered 2021-01-26 (×3): 1 [drp] via OPHTHALMIC
  Filled 2021-01-26: qty 2.5

## 2021-01-26 MED ORDER — ORAL CARE MOUTH RINSE: 15.0000 mL | Freq: Once | OROMUCOSAL | Status: AC

## 2021-01-26 MED ORDER — MORPHINE SULFATE (PF) 2 MG/ML IV SOLN: 1.0000 mg | INTRAVENOUS | Status: DC | PRN

## 2021-01-26 MED ORDER — ATROPINE SULFATE 1 % OP SOLN
OPHTHALMIC | Status: AC
  Filled 2021-01-26: qty 5

## 2021-01-26 MED ORDER — PREDNISOLONE ACETATE 1 % OP SUSP
1.0000 [drp] | Freq: Four times a day (QID) | OPHTHALMIC | Status: DC
  Filled 2021-01-26: qty 5

## 2021-01-26 MED ORDER — BSS IO SOLN
INTRAOCULAR | Status: AC
  Filled 2021-01-26: qty 15

## 2021-01-26 MED ORDER — LACTATED RINGERS IV SOLN: INTRAVENOUS | Status: DC | PRN

## 2021-01-26 MED ORDER — ACETAMINOPHEN 10 MG/ML IV SOLN: 1000.0000 mg | Freq: Once | INTRAVENOUS | Status: DC | PRN

## 2021-01-26 MED ORDER — LIDOCAINE 2% (20 MG/ML) 5 ML SYRINGE
INTRAMUSCULAR | Status: DC | PRN
  Administered 2021-01-26: 60 mg via INTRAVENOUS

## 2021-01-26 MED ORDER — ONDANSETRON HCL 4 MG/2ML IJ SOLN
INTRAMUSCULAR | Status: AC
  Filled 2021-01-26: qty 2

## 2021-01-26 MED ORDER — FREESTYLE LIBRE READER DEVI: Freq: Two times a day (BID) | Status: DC

## 2021-01-26 MED ORDER — DORZOLAMIDE HCL 2 % OP SOLN
1.0000 [drp] | Freq: Three times a day (TID) | OPHTHALMIC | Status: DC
  Filled 2021-01-26: qty 10

## 2021-01-26 MED ORDER — EPINEPHRINE PF 1 MG/ML IJ SOLN
INTRAOCULAR | Status: DC | PRN
  Administered 2021-01-26: 500 mL

## 2021-01-26 SURGICAL SUPPLY — 71 items
APPLICATOR DR MATTHEWS STRL (MISCELLANEOUS) ×2 IMPLANT
BAND WRIST GAS GREEN (MISCELLANEOUS) IMPLANT
BLADE EYE CATARACT 19 1.4 BEAV (BLADE) IMPLANT
BLADE KERATOME 2.75 (BLADE) ×3 IMPLANT
CABLE BIPOLOR RESECTION CORD (MISCELLANEOUS) ×2 IMPLANT
CANNULA ANT/CHMB 27G (MISCELLANEOUS) IMPLANT
CANNULA ANT/CHMB 27GA (MISCELLANEOUS) ×2 IMPLANT
CANNULA VLV SOFT TIP 25G (OPHTHALMIC) ×1 IMPLANT
CANNULA VLV SOFT TIP 25GA (OPHTHALMIC) ×2 IMPLANT
COTTONBALL LRG STERILE PKG (GAUZE/BANDAGES/DRESSINGS) ×6 IMPLANT
DRAPE INCISE 51X51 W/FILM STRL (DRAPES) ×1 IMPLANT
DRAPE OPHTHALMIC 77X100 STRL (CUSTOM PROCEDURE TRAY) ×2 IMPLANT
FILTER BLUE MILLIPORE (MISCELLANEOUS) IMPLANT
FORCEPS ECKARDT ILM 25G SERR (OPHTHALMIC RELATED) IMPLANT
FORCEPS GRIESHABER ILM 25G A (INSTRUMENTS) ×2 IMPLANT
FORCEPS HORIZONTAL 25G DISP (OPHTHALMIC RELATED) IMPLANT
GAS IO SF6 125GM ISPAN CNSTL (MISCELLANEOUS) IMPLANT
GAS TANK CONSTELL (MISCELLANEOUS) ×2
GAS WRIST BAND GREEN (MISCELLANEOUS)
GLOVE SURG 8.5 LATEX PF (GLOVE) ×2 IMPLANT
GLOVE SURG MICRO LTX SZ6.5 (GLOVE) ×4 IMPLANT
GLOVE SURG MICRO LTX SZ7 (GLOVE) ×2 IMPLANT
GLOVE TRIUMPH SURG SIZE 8.5 (KITS) ×2 IMPLANT
GOWN STRL REUS W/ TWL LRG LVL3 (GOWN DISPOSABLE) ×3 IMPLANT
GOWN STRL REUS W/TWL LRG LVL3 (GOWN DISPOSABLE) ×6
HANDLE PNEUMATIC FOR CONSTEL (OPHTHALMIC) ×2 IMPLANT
KIT BASIN OR (CUSTOM PROCEDURE TRAY) ×2 IMPLANT
KIT TURNOVER KIT B (KITS) ×2 IMPLANT
KNIFE CRESCENT 1.75 EDGEAHEAD (BLADE) IMPLANT
LENS IOL POST 1PIECE DIOP 21.0 (Intraocular Lens) ×1 IMPLANT
MICROPICK 25G (MISCELLANEOUS)
NDL 18GX1X1/2 (RX/OR ONLY) (NEEDLE) ×1 IMPLANT
NDL 25GX 5/8IN NON SAFETY (NEEDLE) ×1 IMPLANT
NDL 27GX1/2 REG BEVEL ECLIP (NEEDLE) IMPLANT
NDL FILTER BLUNT 18X1 1/2 (NEEDLE) ×1 IMPLANT
NDL HYPO 30X.5 LL (NEEDLE) ×1 IMPLANT
NEEDLE 18GX1X1/2 (RX/OR ONLY) (NEEDLE) ×2 IMPLANT
NEEDLE 25GX 5/8IN NON SAFETY (NEEDLE) ×2 IMPLANT
NEEDLE 27GX1/2 REG BEVEL ECLIP (NEEDLE) ×2 IMPLANT
NEEDLE FILTER BLUNT 18X 1/2SAF (NEEDLE) ×1
NEEDLE FILTER BLUNT 18X1 1/2 (NEEDLE) ×1 IMPLANT
NEEDLE HYPO 30X.5 LL (NEEDLE) ×2 IMPLANT
NS IRRIG 1000ML POUR BTL (IV SOLUTION) ×2 IMPLANT
PACK VITRECTOMY CUSTOM (CUSTOM PROCEDURE TRAY) ×2 IMPLANT
PAD ARMBOARD 7.5X6 YLW CONV (MISCELLANEOUS) ×4 IMPLANT
PAK PIK VITRECTOMY CVS 25GA (OPHTHALMIC) ×2 IMPLANT
PENCIL BIPOLAR 25GA STR DISP (OPHTHALMIC RELATED) IMPLANT
PIC ILLUMINATED 25G (OPHTHALMIC) ×2
PICK MICROPICK 25G (MISCELLANEOUS) IMPLANT
PIK ILLUMINATED 25G (OPHTHALMIC) ×1 IMPLANT
PROBE LASER ILLUM FLEX CVD 25G (OPHTHALMIC) IMPLANT
ROLLS DENTAL (MISCELLANEOUS) ×4 IMPLANT
SCRAPER DIAMOND 25GA (OPHTHALMIC RELATED) IMPLANT
SPEAR EYE SURG WECK-CEL (MISCELLANEOUS) ×2 IMPLANT
SPONGE SURGIFOAM ABS GEL 12-7 (HEMOSTASIS) ×2 IMPLANT
STOPCOCK 4 WAY LG BORE MALE ST (IV SETS) IMPLANT
SUT CHROMIC 7 0 TG140 8 (SUTURE) ×2 IMPLANT
SUT ETHILON 10 0 CS140 6 (SUTURE) ×2 IMPLANT
SUT ETHILON 9 0 BV130 4 (SUTURE) ×1 IMPLANT
SUT ETHILON 9 0 TG140 8 (SUTURE) IMPLANT
SUT POLY NON ABSORB 10-0 8 STR (SUTURE) ×4 IMPLANT
SUT SILK 4 0 RB 1 (SUTURE) ×1 IMPLANT
SYR 10ML LL (SYRINGE) ×1 IMPLANT
SYR 20ML LL LF (SYRINGE) ×2 IMPLANT
SYR 5ML LL (SYRINGE) ×1 IMPLANT
SYR BULB EAR ULCER 3OZ GRN STR (SYRINGE) ×2 IMPLANT
SYR TB 1ML LUER SLIP (SYRINGE) ×2 IMPLANT
TAPE SURG TRANSPORE 1 IN (GAUZE/BANDAGES/DRESSINGS) ×1 IMPLANT
TAPE SURGICAL TRANSPORE 1 IN (GAUZE/BANDAGES/DRESSINGS) ×2
TUBING HIGH PRESS EXTEN 6IN (TUBING) IMPLANT
WATER STERILE IRR 1000ML POUR (IV SOLUTION) ×2 IMPLANT

## 2021-01-26 NOTE — Brief Op Note (Signed)
Brief Operative note   Preoperative diagnosis:  Dislocated Intra Ocular Lens left eye Postoperative diagnosis Posteriorly dislocated intraocular lens left eye  Procedures: Pars plana vitrectomy, removal of intraocular lens from vitreous, placement of secondary intraocular lens with suture, gas injection left eye  Surgeon:  [name], MD...  Assistant:  Deatra Ina SA    Anesthesia: General  Specimen: none  Estimated blood loss:  1cc  Complications: none  Patient sent to PACU in good condition  Composed by [name] MD  Dictation number: [id number]

## 2021-01-26 NOTE — Anesthesia Procedure Notes (Signed)
Procedure Name: Intubation Date/Time: 01/26/2021 11:36 AM Performed by: Renato Shin, CRNA Pre-anesthesia Checklist: Patient identified, Emergency Drugs available, Suction available and Patient being monitored Patient Re-evaluated:Patient Re-evaluated prior to induction Oxygen Delivery Method: Circle system utilized Preoxygenation: Pre-oxygenation with 100% oxygen Induction Type: IV induction Ventilation: Mask ventilation without difficulty Laryngoscope Size: Miller and 3 Grade View: Grade I Tube type: Oral Tube size: 7.5 mm Number of attempts: 1 Airway Equipment and Method: Stylet and Oral airway Placement Confirmation: ETT inserted through vocal cords under direct vision, positive ETCO2 and breath sounds checked- equal and bilateral Secured at: 21 cm Tube secured with: Tape Dental Injury: Teeth and Oropharynx as per pre-operative assessment

## 2021-01-26 NOTE — Anesthesia Preprocedure Evaluation (Addendum)
Anesthesia Evaluation  Patient identified by MRN, date of birth, ID band Patient awake    Reviewed: Allergy & Precautions, H&P , NPO status , Patient's Chart, lab work & pertinent test results  History of Anesthesia Complications Negative for: history of anesthetic complications  Airway Mallampati: II  TM Distance: >3 FB Neck ROM: full    Dental  (+) Teeth Intact, Dental Advisory Given   Pulmonary shortness of breath, COPD, Current Smoker and Patient abstained from smoking.,    breath sounds clear to auscultation       Cardiovascular hypertension, Pt. on medications (-) angina(-) Past MI and (-) CHF  Rhythm:regular Rate:Normal     Neuro/Psych negative neurological ROS  negative psych ROS   GI/Hepatic negative GI ROS, Neg liver ROS,   Endo/Other  diabetes, Type 2, Insulin Dependent  Renal/GU negative Renal ROS     Musculoskeletal   Abdominal   Peds  Hematology negative hematology ROS (+)   Anesthesia Other Findings   Reproductive/Obstetrics                            Anesthesia Physical Anesthesia Plan  ASA: 3  Anesthesia Plan: General   Post-op Pain Management:    Induction: Intravenous  PONV Risk Score and Plan: 1 and Ondansetron, Dexamethasone and Treatment may vary due to age or medical condition  Airway Management Planned: Oral ETT  Additional Equipment:   Intra-op Plan:   Post-operative Plan: Extubation in OR  Informed Consent: I have reviewed the patients History and Physical, chart, labs and discussed the procedure including the risks, benefits and alternatives for the proposed anesthesia with the patient or authorized representative who has indicated his/her understanding and acceptance.     Dental advisory given  Plan Discussed with: CRNA, Anesthesiologist and Surgeon  Anesthesia Plan Comments:         Anesthesia Quick Evaluation

## 2021-01-26 NOTE — H&P (Signed)
I examined the patient today and there is no change in the medical status 

## 2021-01-26 NOTE — Transfer of Care (Signed)
Immediate Anesthesia Transfer of Care Note  Patient: Douglas [name].  Procedure(s) Performed: PARS PLANA VITRECTOMY WITH 25G (Left: Eye) REMOVAL OF INTRAOCULAR LENS FROM THE VITREOUS (Left: Eye) PLACEMENT AND SUTURE OF SECONDARY INTRAOCULAR LENS (Left: Eye) AIR/FLUID EXCHANGE (Left: Eye)  Patient Location: PACU  Anesthesia Type:General  Level of Consciousness: drowsy and patient cooperative  Airway & Oxygen Therapy: Patient Spontanous Breathing and Patient connected to nasal cannula oxygen  Post-op Assessment: Report given to RN and Post -op Vital signs reviewed and stable  Post vital signs: Reviewed and stable  Last Vitals:  Vitals Value Taken Time  BP 145/66 01/26/21 1354  Temp    Pulse 75 01/26/21 1357  Resp 17 01/26/21 1357  SpO2 94 % 01/26/21 1357  Vitals shown include unvalidated device data.  Last Pain:  Vitals:   01/26/21 0943  TempSrc:   PainSc: 0-No pain         Complications: No notable events documented.

## 2021-01-26 NOTE — Anesthesia Postprocedure Evaluation (Signed)
Anesthesia Post Note  Patient: Douglas [name].  Procedure(s) Performed: PARS PLANA VITRECTOMY WITH 25G (Left: Eye) REMOVAL OF INTRAOCULAR LENS FROM THE VITREOUS (Left: Eye) PLACEMENT AND SUTURE OF SECONDARY INTRAOCULAR LENS (Left: Eye) AIR/FLUID EXCHANGE (Left: Eye)     Patient location during evaluation: PACU Anesthesia Type: General Level of consciousness: awake and alert Pain management: pain level controlled Vital Signs Assessment: post-procedure vital signs reviewed and stable Respiratory status: spontaneous breathing, nonlabored ventilation, respiratory function stable and patient connected to nasal cannula oxygen Cardiovascular status: blood pressure returned to baseline and stable Postop Assessment: no apparent nausea or vomiting Anesthetic complications: no   No notable events documented.  Last Vitals:  Vitals:   01/26/21 1354 01/26/21 1409  BP: (!) 145/66 135/63  Pulse: 78 78  Resp: (!) 22 14  Temp: (!) 36.1 C   SpO2: 98% 95%    Last Pain:  Vitals:   01/26/21 1409  TempSrc:   PainSc: 0-No pain                 Yanky Vanderburg S

## 2021-01-26 NOTE — Op Note (Signed)
NAMEJOHNAVON, MCCLAFFERTY MEDICAL RECORD NO: [id number] ACCOUNT NO: [id number] DATE OF BIRTH: [birth year] FACILITY: [facility] LOCATION: [facility] PHYSICIAN: Chrystie Nose. Zigmund Daniel, MD  Operative Report   DATE OF PROCEDURE: 01/26/2021  ADMISSION DIAGNOSIS:  Posterior dislocated intraocular lens with capsule, left eye.  PROCEDURES:  Pars plana vitrectomy, removal of posteriorly dislocated intraocular lens from vitreous left eye, placement of secondary intraocular lens with suture, gas fluid exchange, all in the left eye.  SURGEON:  Chrystie Nose. Zigmund Daniel, MD  ASSISTANT:  Deatra Ina, SA  ANESTHESIA:  General.  DESCRIPTION OF PROCEDURE:  Usual prep and drape.  Conjunctival peritomy from 8 o'clock around to 4 o'clock.  Half-thickness scleral flaps were raised at 4 o'clock and 10 o'clock in anticipation of IOL suture.  Corneoscleral wound was created on the white  sclera from 10 o'clock to 2 o'clock up into the cornea and into the anterior chamber.  25-gauge trocars were placed at 10, 2, and 4 o'clock with infusion at 4 o'clock.  Pars plana vitrectomy was begun just behind the pupillary axis. The intraocular lens  was posteriorly dislocated and snared in vitreous.  The vitreous was carefully teased from around the intraocular lens and carefully removed with the vitrectomy instrument.  The vitrectomy was carried posteriorly down to the macular surface.  A core  vitrectomy was performed.  Blood and debris were seen in the vitreous cavity.  This was carefully removed under low suction and rapid cutting. The intraocular lens was freed from the vitreous and allowed to float in the eye.  Keratome opening of the  corneoscleral wound from 10 to 2 o'clock.  The vitreous cutter was used to suction the intraocular lens and bring it into the mid vitreous cavity.  The lighted pick was used to engage the intraocular lens and the lens capsule.  This was passed into the  anterior chamber and out through the corneoscleral wound. The lens  was dialed out of the eye.  Additional vitrectomy was performed to remove additional capsule and capsular remnants. 2 Prolene sutures were passed beneath the scleral flaps behind the iris  from 4 o'clock to 10 o'clock.  A docking needle was used to exit the Prolene sutures.  The sutures were drawn out of the eye through the corneoscleral wound.  A new intraocular lens was brought onto the field, made by [employer], model CZ70BD, power 21D,  serial number [id number], expiration 01/15/2025.  The lens was brought onto the field, inspected, and cleaned.  Prolene sutures were attached to the eyelets of the lens, and the free ends were removed.  The lens was passed into the anterior chamber,  then into the posterior chamber, then dialed into place.  The Prolene sutures were pulled externally beneath the scleral flaps.  They were then knotted, and the free ends were removed.  The cornea was closed with 6 interrupted 10-0 nylon sutures.  The  wound was tested and found to be secure.  Provisc was placed on the corneal surface.  Additional vitrectomy was carried out at this time removing some additional vitreous debris, pigment, and blood.  Once the entire vitreous cavity was cleaned, a 30% gas  fluid exchange was carried out.  A peripheral iridectomy was performed at 12 o'clock with the vitreous cutter.  The instruments were removed from the eye.  The 25-gauge trocars were removed from the eye.  The wounds were tested and found to be secure.   The conjunctiva was reposited with 7-0 chromic suture.  Polymyxin and ceftazidime  were rinsed around the globe for antibiotic coverage.  Decadron 10 mg was injected into the lower subconjunctival space.  Marcaine was rinsed around the globe for  postoperative pain.  Closing pressure was 15 with Barraquer tonometer.  Polysporin, ophthalmic ointment, patch and shield were placed.  The patient was awakened and taken to recovery in satisfactory condition.  COMPLICATIONS:   None.  DURATION:  2 hours.   ROH D: 01/26/2021 1:50:52 pm T: 01/26/2021 11:56:00 pm  JOB: [id number]/ [id number]

## 2021-01-27 ENCOUNTER — Encounter (HOSPITAL_COMMUNITY): Payer: Self-pay | Admitting: Ophthalmology

## 2021-01-27 DIAGNOSIS — E119 Type 2 diabetes mellitus without complications: Secondary | ICD-10-CM | POA: Diagnosis not present

## 2021-01-27 DIAGNOSIS — Z794 Long term (current) use of insulin: Secondary | ICD-10-CM | POA: Diagnosis not present

## 2021-01-27 DIAGNOSIS — T8522XA Displacement of intraocular lens, initial encounter: Secondary | ICD-10-CM | POA: Diagnosis not present

## 2021-01-27 DIAGNOSIS — H5462 Unqualified visual loss, left eye, normal vision right eye: Secondary | ICD-10-CM | POA: Diagnosis not present

## 2021-01-27 DIAGNOSIS — F17210 Nicotine dependence, cigarettes, uncomplicated: Secondary | ICD-10-CM | POA: Diagnosis not present

## 2021-01-27 LAB — GLUCOSE, CAPILLARY: Glucose-Capillary: 290 mg/dL — ABNORMAL HIGH (ref 70–99)

## 2021-01-27 MED ORDER — BRIMONIDINE TARTRATE 0.2 % OP SOLN: 1.0000 [drp] | Freq: Two times a day (BID) | OPHTHALMIC | 12 refills | Status: DC

## 2021-01-27 MED ORDER — PREDNISOLONE ACETATE 1 % OP SUSP: 1.0000 [drp] | Freq: Four times a day (QID) | OPHTHALMIC | 0 refills | Status: AC

## 2021-01-27 MED ORDER — GATIFLOXACIN 0.5 % OP SOLN: 1.0000 [drp] | Freq: Four times a day (QID) | OPHTHALMIC | Status: DC

## 2021-01-27 MED ORDER — STERILE WATER FOR INJECTION IJ SOLN
INTRAMUSCULAR | Status: AC
  Filled 2021-01-27: qty 10

## 2021-01-27 MED ORDER — DORZOLAMIDE HCL 2 % OP SOLN
1.0000 [drp] | Freq: Two times a day (BID) | OPHTHALMIC | 12 refills | Status: DC
Start: 2021-01-27 — End: 2021-06-30

## 2021-01-27 NOTE — Progress Notes (Signed)
01/27/2021, 6:32 AM  Mental Status:  Awake, Alert, Oriented  Anterior segment: Cornea  Clear    Anterior Chamber Clear    Lens:   Clear, IOL,  Intra Ocular Pressure 20 mmHg with Tonopen  Vitreous: Clear 30%gas bubble   Retina:  Attached Good laser reaction Gas bubble  Impression: Excellent result Retina attached  Final Diagnosis: Active Problems:   Dislocated IOL (intraocular lens), posterior, left   Plan: start post operative eye drops.  Discharge to home.  Give post operative instructions  Douglas [name] 01/27/2021, 6:32 AM

## 2021-01-27 NOTE — Discharge Summary (Signed)
Discharge summary not needed on [facility] patients per medical records. 

## 2021-01-27 NOTE — Progress Notes (Signed)
Rip Har[place]. to be D/C'd  per MD order.  Discussed with the patient and all questions fully answered.  VSS, Skin clean, dry and intact without evidence of skin break down, no evidence of skin tears noted.  IV catheter discontinued intact. Site without signs and symptoms of complications. Dressing and pressure applied.  An After Visit Summary was printed and given to the patient. Patient received prescription.  D/c re-education completed with patient/family including follow up instructions, medication list, d/c activities limitations if indicated, with other d/c instructions as indicated by MD - patient able to verbalize understanding, all questions fully answered.   Patient instructed to return to ED, call 911, or call MD for any changes in condition.   Patient to be escorted via [place], and D/C home via private auto.

## 2021-02-02 ENCOUNTER — Encounter (INDEPENDENT_AMBULATORY_CARE_PROVIDER_SITE_OTHER): Payer: Medicare Other | Admitting: Ophthalmology

## 2021-02-02 ENCOUNTER — Other Ambulatory Visit: Payer: Self-pay

## 2021-02-02 DIAGNOSIS — Z961 Presence of intraocular lens: Secondary | ICD-10-CM

## 2021-02-09 LAB — SURGICAL PATHOLOGY

## 2021-02-11 DIAGNOSIS — H401131 Primary open-angle glaucoma, bilateral, mild stage: Secondary | ICD-10-CM | POA: Diagnosis not present

## 2021-02-11 DIAGNOSIS — H40043 Steroid responder, bilateral: Secondary | ICD-10-CM | POA: Diagnosis not present

## 2021-02-11 DIAGNOSIS — E113513 Type 2 diabetes mellitus with proliferative diabetic retinopathy with macular edema, bilateral: Secondary | ICD-10-CM | POA: Diagnosis not present

## 2021-02-19 ENCOUNTER — Other Ambulatory Visit: Payer: Self-pay

## 2021-02-19 ENCOUNTER — Encounter (INDEPENDENT_AMBULATORY_CARE_PROVIDER_SITE_OTHER): Payer: Medicare Other | Admitting: Ophthalmology

## 2021-02-19 DIAGNOSIS — H35033 Hypertensive retinopathy, bilateral: Secondary | ICD-10-CM

## 2021-02-19 DIAGNOSIS — E103513 Type 1 diabetes mellitus with proliferative diabetic retinopathy with macular edema, bilateral: Secondary | ICD-10-CM | POA: Diagnosis not present

## 2021-02-19 DIAGNOSIS — I10 Essential (primary) hypertension: Secondary | ICD-10-CM | POA: Diagnosis not present

## 2021-02-19 DIAGNOSIS — H43811 Vitreous degeneration, right eye: Secondary | ICD-10-CM

## 2021-02-22 DIAGNOSIS — J00 Acute nasopharyngitis [common cold]: Secondary | ICD-10-CM | POA: Diagnosis not present

## 2021-04-02 ENCOUNTER — Other Ambulatory Visit: Payer: Self-pay

## 2021-04-02 ENCOUNTER — Encounter (INDEPENDENT_AMBULATORY_CARE_PROVIDER_SITE_OTHER): Payer: Medicare Other | Admitting: Ophthalmology

## 2021-04-02 DIAGNOSIS — I10 Essential (primary) hypertension: Secondary | ICD-10-CM | POA: Diagnosis not present

## 2021-04-02 DIAGNOSIS — H43811 Vitreous degeneration, right eye: Secondary | ICD-10-CM | POA: Diagnosis not present

## 2021-04-02 DIAGNOSIS — E113513 Type 2 diabetes mellitus with proliferative diabetic retinopathy with macular edema, bilateral: Secondary | ICD-10-CM | POA: Diagnosis not present

## 2021-04-02 DIAGNOSIS — H35033 Hypertensive retinopathy, bilateral: Secondary | ICD-10-CM

## 2021-05-20 ENCOUNTER — Encounter (INDEPENDENT_AMBULATORY_CARE_PROVIDER_SITE_OTHER): Payer: Self-pay

## 2021-05-20 ENCOUNTER — Encounter (INDEPENDENT_AMBULATORY_CARE_PROVIDER_SITE_OTHER): Payer: Medicare Other | Admitting: Ophthalmology

## 2021-05-28 ENCOUNTER — Encounter (INDEPENDENT_AMBULATORY_CARE_PROVIDER_SITE_OTHER): Payer: Medicare Other | Admitting: Ophthalmology

## 2021-05-28 ENCOUNTER — Other Ambulatory Visit: Payer: Self-pay

## 2021-05-28 DIAGNOSIS — E113513 Type 2 diabetes mellitus with proliferative diabetic retinopathy with macular edema, bilateral: Secondary | ICD-10-CM

## 2021-05-28 DIAGNOSIS — I10 Essential (primary) hypertension: Secondary | ICD-10-CM | POA: Diagnosis not present

## 2021-05-28 DIAGNOSIS — H35033 Hypertensive retinopathy, bilateral: Secondary | ICD-10-CM

## 2021-05-28 DIAGNOSIS — H43812 Vitreous degeneration, left eye: Secondary | ICD-10-CM

## 2021-05-31 DIAGNOSIS — H40043 Steroid responder, bilateral: Secondary | ICD-10-CM | POA: Diagnosis not present

## 2021-05-31 DIAGNOSIS — E113513 Type 2 diabetes mellitus with proliferative diabetic retinopathy with macular edema, bilateral: Secondary | ICD-10-CM | POA: Diagnosis not present

## 2021-05-31 DIAGNOSIS — H401131 Primary open-angle glaucoma, bilateral, mild stage: Secondary | ICD-10-CM | POA: Diagnosis not present

## 2021-06-07 DIAGNOSIS — E109 Type 1 diabetes mellitus without complications: Secondary | ICD-10-CM | POA: Diagnosis not present

## 2021-06-07 DIAGNOSIS — E1042 Type 1 diabetes mellitus with diabetic polyneuropathy: Secondary | ICD-10-CM | POA: Diagnosis not present

## 2021-06-07 DIAGNOSIS — I10 Essential (primary) hypertension: Secondary | ICD-10-CM | POA: Diagnosis not present

## 2021-06-07 DIAGNOSIS — E10319 Type 1 diabetes mellitus with unspecified diabetic retinopathy without macular edema: Secondary | ICD-10-CM | POA: Diagnosis not present

## 2021-06-07 DIAGNOSIS — E1065 Type 1 diabetes mellitus with hyperglycemia: Secondary | ICD-10-CM | POA: Diagnosis not present

## 2021-06-10 ENCOUNTER — Encounter (INDEPENDENT_AMBULATORY_CARE_PROVIDER_SITE_OTHER): Payer: Medicare Other | Admitting: Ophthalmology

## 2021-06-10 ENCOUNTER — Other Ambulatory Visit: Payer: Self-pay

## 2021-06-10 DIAGNOSIS — E113513 Type 2 diabetes mellitus with proliferative diabetic retinopathy with macular edema, bilateral: Secondary | ICD-10-CM

## 2021-06-10 DIAGNOSIS — H27131 Posterior dislocation of lens, right eye: Secondary | ICD-10-CM

## 2021-06-10 DIAGNOSIS — H43811 Vitreous degeneration, right eye: Secondary | ICD-10-CM | POA: Diagnosis not present

## 2021-06-10 DIAGNOSIS — H35033 Hypertensive retinopathy, bilateral: Secondary | ICD-10-CM | POA: Diagnosis not present

## 2021-06-10 DIAGNOSIS — I10 Essential (primary) hypertension: Secondary | ICD-10-CM | POA: Diagnosis not present

## 2021-06-15 DIAGNOSIS — T8522XA Displacement of intraocular lens, initial encounter: Secondary | ICD-10-CM | POA: Diagnosis present

## 2021-06-15 NOTE — H&P (Signed)
Douglas [name]. is an 71 y.o. male.   Chief Complaint:Loss of vision right eye HPI: sudden vision loss from lens dislocation right eye  Past Medical History:  Diagnosis Date   COPD (chronic obstructive pulmonary disease) ([place])    no inhalers used   DM type 1 (diabetes mellitus, type 1) (HCC)    Dyspnea    with heavy exertion   Glaucoma    Hypercholesteremia    Hypertension    Prostate cancer [facility])    Wears glasses    for reading    Past Surgical History:  Procedure Laterality Date   AIR/FLUID EXCHANGE Left 01/26/2021   Procedure: AIR/FLUID EXCHANGE;  Surgeon: [name], MD;  Location: [place];  Service: Ophthalmology;  Laterality: Left;   COLONOSCOPY N/A 04/05/2013   Procedure: COLONOSCOPY;  Surgeon: Danie Binder, MD;  [facility];  Service: Endoscopy;  Laterality: N/A;  8:30 AM   CYSTOSCOPY N/A 07/20/2020   Procedure: CYSTOSCOPY;  Surgeon: Cleon Gustin, MD;  Location[facility];  Service: Urology;  Laterality: N/A;  NO SEEDS DETECTED BY DR. Alyson Ingles   INTRAOCULAR LENS REMOVAL Left 01/26/2021   Procedure: REMOVAL OF INTRAOCULAR LENS FROM THE VITREOUS;  Surgeon: [name], MD;  Location: [place];  Service: Ophthalmology;  Laterality: Left;   NO PAST SURGERIES     PARS PLANA VITRECTOMY Left 01/26/2021   Procedure: PARS PLANA VITRECTOMY WITH 25G;  Surgeon: [name], MD;  Location: [place];  Service: Ophthalmology;  Laterality: Left;   PLACEMENT AND SUTURE OF SECONDARY INTRAOCULAR LENS Left 01/26/2021   Procedure: PLACEMENT AND SUTURE OF SECONDARY INTRAOCULAR LENS;  Surgeon: [name], MD;  Location: [place];  Service: Ophthalmology;  Laterality: Left;   PROSTATE BIOPSY     RADIOACTIVE SEED IMPLANT N/A 07/20/2020   Procedure: RADIOACTIVE SEED IMPLANT/BRACHYTHERAPY IMPLANT;  Surgeon: Cleon Gustin, MD;  Location: [facility];  Service: Urology;  Laterality: N/A;  54 SEEDS IMPLANTED   SPACE OAR INSTILLATION  N/A 07/20/2020   Procedure: SPACE OAR INSTILLATION;  Surgeon: Cleon Gustin, MD;  Location: [facility];  Service: Urology;  Laterality: N/A;    Family History  Problem Relation Age of Onset   Breast cancer Mother    Colon cancer Neg Hx    Pancreatic cancer Neg Hx    Prostate cancer Neg Hx    Social History:  reports that he has been smoking cigarettes. He has a 35.00 pack-year smoking history. He has never used smokeless tobacco. He reports that he does not drink alcohol and does not use drugs.  Allergies: No Known Allergies  No medications prior to admission.    Review of systems otherwise negative  There were no vitals taken for this visit.  Physical exam: Mental status: oriented x3. Eyes: See eye exam associated with this date of surgery in media tab.  Scanned in by scanning center Ears, Nose, Throat: within normal limits Neck: Within Normal limits General: within normal limits Chest: Within normal limits Breast: deferred Heart: Within normal limits Abdomen: Within normal limits GU: deferred Extremities: within normal limits Skin: within normal limits  Assessment/Plan Posterior dislocation of intraocular lens right eye Plan: To [facility] for Pars plana vitrectomy, removal of dislocated intraocular lens, laser, placement of secondary intraocular lens with suture, gas injection right eye  [name] 06/15/2021, 7:34 AM

## 2021-06-23 DIAGNOSIS — I10 Essential (primary) hypertension: Secondary | ICD-10-CM | POA: Diagnosis not present

## 2021-06-23 DIAGNOSIS — E785 Hyperlipidemia, unspecified: Secondary | ICD-10-CM | POA: Diagnosis not present

## 2021-06-25 ENCOUNTER — Encounter (INDEPENDENT_AMBULATORY_CARE_PROVIDER_SITE_OTHER): Payer: Medicare Other | Admitting: Ophthalmology

## 2021-06-25 ENCOUNTER — Other Ambulatory Visit: Payer: Self-pay

## 2021-06-25 DIAGNOSIS — H43811 Vitreous degeneration, right eye: Secondary | ICD-10-CM | POA: Diagnosis not present

## 2021-06-25 DIAGNOSIS — H35033 Hypertensive retinopathy, bilateral: Secondary | ICD-10-CM

## 2021-06-25 DIAGNOSIS — I10 Essential (primary) hypertension: Secondary | ICD-10-CM | POA: Diagnosis not present

## 2021-06-25 DIAGNOSIS — E113513 Type 2 diabetes mellitus with proliferative diabetic retinopathy with macular edema, bilateral: Secondary | ICD-10-CM | POA: Diagnosis not present

## 2021-07-01 ENCOUNTER — Other Ambulatory Visit: Payer: Medicare Other

## 2021-07-01 DIAGNOSIS — I10 Essential (primary) hypertension: Secondary | ICD-10-CM | POA: Diagnosis not present

## 2021-07-01 DIAGNOSIS — E785 Hyperlipidemia, unspecified: Secondary | ICD-10-CM | POA: Diagnosis not present

## 2021-07-01 DIAGNOSIS — Z794 Long term (current) use of insulin: Secondary | ICD-10-CM | POA: Diagnosis not present

## 2021-07-01 DIAGNOSIS — Z01818 Encounter for other preprocedural examination: Secondary | ICD-10-CM | POA: Diagnosis not present

## 2021-07-02 ENCOUNTER — Other Ambulatory Visit (HOSPITAL_COMMUNITY): Payer: Medicare Other

## 2021-07-05 ENCOUNTER — Other Ambulatory Visit: Payer: Self-pay

## 2021-07-05 ENCOUNTER — Encounter (HOSPITAL_COMMUNITY): Payer: Self-pay | Admitting: Ophthalmology

## 2021-07-05 NOTE — Progress Notes (Addendum)
Anesthesia Chart Review: ? Case: [id number] Date/Time: 07/06/21 1115  ? Procedure: PARS PLANA VITRECTOMY WITH 25G REMOVAL/SUTURE INTRAOCULAR LENS (Right)  ? Anesthesia type: General  ? Pre-op diagnosis: Dislocated Intra Ocular Lens right eye  ? Location: [facility] OR ROOM 08 / [place] [facility]  ? Surgeons: [name], MD  ? ?  ? ? ?DISCUSSION: Patient is a 71 year old male scheduled for the above procedure. A/P as outlined by Dr. Zigmund Daniel: ?"Posterior dislocation of intraocular lens right eye ?Plan: To [facility] for Pars plana vitrectomy, removal of dislocated intraocular lens, laser, placement of secondary intraocular lens with suture, gas injection right eye". ? ?Other history includes smoking, DM1, HTN, hypercholesterolemia, COPD, DOE, glaucoma, prostate cancer (s/p radioactive seed implant 07/20/20). S/p Pars plana vitrectomy, removal of intraocular lens from vitreous, placement of secondary intraocular lens with suture, gas injection left eye on 01/26/21. ? ?He is a same-day work-up, so anesthesia team to evaluate on the day of surgery.  Labs and EKG on arrival as indicated.  Last A1c 7.7% on 06/08/21.  Patient has a [employer] 2 sensor. ? ?ADDENDUM 07/06/21 9:55 AM:  ?Surgical clearance note received from Ferdinand Lango, NP who wrote on 07/01/21, "Based on my assessment today, [name] is stable for ophthalmic surgery under general anesthesia." Last EKG received was from 06/18/20. CMET from 06/23/21 sent showing: Glucose 226, BUN 19, creatinine 0.89, sodium 137, potassium 4.2, calcium 9.2, AST 19, ALT 23. ? ? ?VS: Ht '5\' 9"'$  (1.753 m)   Wt 80.3 kg   BMI 26.14 kg/m?  ? ?PROVIDERS: ?Pllc, [facility] is where he receives primary care ?Kathlene Cote, MD is endocrinologist  ?[name], MD is RAD-ONC ?[name], MD is urologist ? ? ?LABS: He is for labs as indicated on the day of surgery. A1c 7.7% on 06/08/2021  ([facility]).  As of 12/01/2020, creatinine 0.86. ? ? ?IMAGES: ?MRI Head/Orbits  9/6//22: ?IMPRESSION: ?1. Motion degraded exam. ?2. Grossly normal MRI of the brain and orbits. No convincing ?evidence for acute optic neuritis. No other acute intracranial ?abnormality. ?  ? ?EKG: Last EKG noted is just over one year ago and showed NSR on 06/18/20.  ? ? ?CV: N/A ? ? ?Past Medical History:  ?Diagnosis Date  ? COPD (chronic obstructive pulmonary disease) ([place])   ? no inhalers used  ? DM type 1 (diabetes mellitus, type 1) ([place])   ? Dyspnea   ? with heavy exertion  ? Glaucoma   ? Hypercholesteremia   ? Hypertension   ? Prostate cancer ([place])   ? Wears glasses   ? for reading  ? ? ?Past Surgical History:  ?Procedure Laterality Date  ? AIR/FLUID EXCHANGE Left 01/26/2021  ? Procedure: AIR/FLUID EXCHANGE;  Surgeon: [name], MD;  Location: [place];  Service: Ophthalmology;  Laterality: Left;  ? COLONOSCOPY N/A 04/05/2013  ? Procedure: COLONOSCOPY;  Surgeon: Danie Binder, MD;  [facility];  Service: Endoscopy;  Laterality: N/A;  8:30 AM  ? CYSTOSCOPY N/A 07/20/2020  ? Procedure: CYSTOSCOPY;  Surgeon: Cleon Gustin, MD;  Location: [facility];  Service: Urology;  Laterality: N/A;  NO SEEDS DETECTED BY DR. Alyson Ingles  ? INTRAOCULAR LENS REMOVAL Left 01/26/2021  ? Procedure: REMOVAL OF INTRAOCULAR LENS FROM THE VITREOUS;  Surgeon: [name], MD;  Location: [place];  Service: Ophthalmology;  Laterality: Left;  ? PARS PLANA VITRECTOMY Left 01/26/2021  ? Procedure: PARS PLANA VITRECTOMY WITH 25G;  Surgeon: [name],  MD;  Location: [place];  Service: Ophthalmology;  Laterality: Left;  ? PLACEMENT AND SUTURE OF SECONDARY INTRAOCULAR LENS Left 01/26/2021  ? Procedure: PLACEMENT AND SUTURE OF SECONDARY INTRAOCULAR LENS;  Surgeon: [name], MD;  Location: [place];  Service: Ophthalmology;  Laterality: Left;  ? PROSTATE BIOPSY    ? RADIOACTIVE SEED IMPLANT N/A 07/20/2020  ? Procedure: RADIOACTIVE SEED IMPLANT/BRACHYTHERAPY IMPLANT;  Surgeon: Cleon Gustin, MD;   Location: [facility];  Service: Urology;  Laterality: N/A;  54 SEEDS IMPLANTED  ? SPACE OAR INSTILLATION N/A 07/20/2020  ? Procedure: SPACE OAR INSTILLATION;  Surgeon: Cleon Gustin, MD;  Location: [facility];  Service: Urology;  Laterality: N/A;  ? ? ?MEDICATIONS: ?No current facility-administered medications for this encounter.  ? ? atorvastatin (LIPITOR) 40 MG tablet  ? brimonidine (ALPHAGAN) 0.2 % ophthalmic solution  ? dorzolamide (TRUSOPT) 2 % ophthalmic solution  ? enalapril (VASOTEC) 10 MG tablet  ? fluticasone (FLONASE) 50 MCG/ACT nasal spray  ? hydrochlorothiazide (HYDRODIURIL) 25 MG tablet  ? insulin lispro (HUMALOG) 100 UNIT/ML KwikPen  ? LANTUS SOLOSTAR 100 UNIT/ML Solostar Pen  ? NON FORMULARY  ? POLYMYXIN B-TRIMETHOPRIM OP  ? Probiotic Product (FLORAJEN3 PO)  ? sildenafil (VIAGRA) 50 MG tablet  ? timolol (TIMOPTIC) 0.25 % ophthalmic solution  ? Continuous Blood Gluc Receiver (FREESTYLE LIBRE READER) DEVI  ? gatifloxacin (ZYMAXID) 0.5 % SOLN  ? prednisoLONE acetate (PRED FORTE) 1 % ophthalmic suspension  ? ? ?Myra Gianotti, PA-C ?Surgical Short Stay/Anesthesiology ?[facility] Phone [phone] ?[facility] Phone [phone] ?07/05/2021 1:46 PM ? ? ? ? ? ? ?

## 2021-07-05 NOTE — Progress Notes (Signed)
DUE TO COVID-19 ONLY ONE VISITOR IS ALLOWED TO COME WITH YOU AND STAY IN THE WAITING ROOM ONLY DURING PRE OP AND PROCEDURE DAY OF SURGERY.  ? ?Two VISITORS MAY VISIT WITH YOU AFTER SURGERY IN YOUR PRIVATE ROOM DURING VISITING HOURS ONLY! ? ?PCP- [facility] in [place] ?Cardiologist - n/a ?Endocrinology - Dr Kathlene Cote ? ?Chest x-ray - n/a ?EKG - DOS ?Stress Test - n/a ?ECHO - n/a ?Cardiac Cath - n/a ? ?ICD Pacemaker/Loop - n/a ? ?Sleep Study -  n/a ?CPAP - none ? ?Patient has Freestyle [employer] 2 sensor located at Right upper arm. ? ?THE NIGHT BEFORE SURGERY, take 13 units Lantus Solosar insulin.     ?THE MORNING OF SURGERY, If your CBG is greater than 220 mg/dL, you may take ? of your sliding scale (correction) dose of insulin. ? ?If your blood sugar is less than 70 mg/dL, you will need to treat for low blood sugar: ?Treat a low blood sugar (less than 70 mg/dL) with ? cup of clear juice (cranberry or apple), 4 glucose tablets, OR glucose gel. ?Recheck blood sugar in 15 minutes after treatment (to make sure it is greater than 70 mg/dL). If your blood sugar is not greater than 70 mg/dL on recheck, call [phone] for further instructions. ? ?Anesthesia review: Yes  ? ?STOP now taking any Aspirin (unless otherwise instructed by your surgeon), Aleve, Naproxen, Ibuprofen, Motrin, Advil, Goody's, BC's, all herbal medications, fish oil, and all vitamins.  ? ?Coronavirus Screening ?Covid test is scheduled on DOS ?Do you have any of the following symptoms:  ?Cough yes/no: No ?Fever (>100.53F)  yes/no: No ?Runny nose yes/no: No ?Sore throat yes/no: No ?Difficulty breathing/shortness of breath  yes/no: No ? ?Have you traveled in the last 14 days and where? yes/no: No ? ?Patient verbalized understanding of instructions that were given via phone.  ?

## 2021-07-05 NOTE — Anesthesia Preprocedure Evaluation (Addendum)
Anesthesia Evaluation  ?Patient identified by MRN, date of birth, ID band ?Patient awake ? ? ? ?Reviewed: ?Allergy & Precautions, NPO status , Patient's Chart, lab work & pertinent test results ? ?Airway ?Mallampati: II ? ?TM Distance: >3 FB ?Neck ROM: Full ? ? ? Dental ?no notable dental hx. ? ?  ?Pulmonary ?COPD, Current Smoker and Patient abstained from smoking.,  ?  ?Pulmonary exam normal ?breath sounds clear to auscultation ? ? ? ? ? ? Cardiovascular ?hypertension, Pt. on medications ?negative cardio ROS ?Normal cardiovascular exam ?Rhythm:Regular Rate:Normal ? ? ?  ?Neuro/Psych ?negative neurological ROS ? negative psych ROS  ? GI/Hepatic ?negative GI ROS, Neg liver ROS,   ?Endo/Other  ?negative endocrine ROSdiabetes, Type 2, Insulin Dependent ? Renal/GU ?negative Renal ROS  ?negative genitourinary ?  ?Musculoskeletal ?negative musculoskeletal ROS ?(+)  ? Abdominal ?  ?Peds ?negative pediatric ROS ?(+)  Hematology ?negative hematology ROS ?(+)   ?Anesthesia Other Findings ? ? Reproductive/Obstetrics ?negative OB ROS ? ?  ? ? ? ? ? ? ? ? ? ? ? ? ? ?  ?  ? ? ? ? ? ? ? ?Anesthesia Physical ?Anesthesia Plan ? ?ASA: 3 ? ?Anesthesia Plan: General  ? ?Post-op Pain Management: Minimal or no pain anticipated  ? ?Induction: Intravenous ? ?PONV Risk Score and Plan: 1 and Ondansetron and Treatment may vary due to age or medical condition ? ?Airway Management Planned: Oral ETT ? ?Additional Equipment:  ? ?Intra-op Plan:  ? ?Post-operative Plan: Extubation in OR ? ?Informed Consent: I have reviewed the patients History and Physical, chart, labs and discussed the procedure including the risks, benefits and alternatives for the proposed anesthesia with the patient or authorized representative who has indicated his/her understanding and acceptance.  ? ? ? ?Dental advisory given ? ?Plan Discussed with: CRNA ? ?Anesthesia Plan Comments: (PAT note written 07/05/2021 by Myra Gianotti, PA-C. ?)   ? ? ? ? ? ?Anesthesia Quick Evaluation ? ?

## 2021-07-06 ENCOUNTER — Ambulatory Visit (HOSPITAL_COMMUNITY)
Admission: RE | Admit: 2021-07-06 | Discharge: 2021-07-07 | Disposition: A | Discharge status: Home / Self Care | Payer: Medicare Other | Attending: Ophthalmology | Admitting: Ophthalmology

## 2021-07-06 ENCOUNTER — Encounter (HOSPITAL_COMMUNITY)
Admission: RE | Disposition: A | Discharge status: Home / Self Care | Payer: Self-pay | Source: Home / Self Care | Attending: Ophthalmology

## 2021-07-06 ENCOUNTER — Ambulatory Visit (HOSPITAL_BASED_OUTPATIENT_CLINIC_OR_DEPARTMENT_OTHER): Payer: Medicare Other | Admitting: Vascular Surgery

## 2021-07-06 ENCOUNTER — Other Ambulatory Visit: Payer: Self-pay

## 2021-07-06 ENCOUNTER — Ambulatory Visit (HOSPITAL_COMMUNITY): Payer: Medicare Other | Admitting: Vascular Surgery

## 2021-07-06 DIAGNOSIS — T8522XA Displacement of intraocular lens, initial encounter: Secondary | ICD-10-CM | POA: Insufficient documentation

## 2021-07-06 DIAGNOSIS — I10 Essential (primary) hypertension: Secondary | ICD-10-CM

## 2021-07-06 DIAGNOSIS — J449 Chronic obstructive pulmonary disease, unspecified: Secondary | ICD-10-CM

## 2021-07-06 DIAGNOSIS — E7800 Pure hypercholesterolemia, unspecified: Secondary | ICD-10-CM | POA: Insufficient documentation

## 2021-07-06 DIAGNOSIS — T8522XD Displacement of intraocular lens, subsequent encounter: Secondary | ICD-10-CM | POA: Diagnosis not present

## 2021-07-06 DIAGNOSIS — E119 Type 2 diabetes mellitus without complications: Secondary | ICD-10-CM | POA: Diagnosis not present

## 2021-07-06 DIAGNOSIS — Z8546 Personal history of malignant neoplasm of prostate: Secondary | ICD-10-CM | POA: Insufficient documentation

## 2021-07-06 DIAGNOSIS — Y778 Miscellaneous ophthalmic devices associated with adverse incidents, not elsewhere classified: Secondary | ICD-10-CM | POA: Insufficient documentation

## 2021-07-06 DIAGNOSIS — E11319 Type 2 diabetes mellitus with unspecified diabetic retinopathy without macular edema: Secondary | ICD-10-CM | POA: Diagnosis not present

## 2021-07-06 DIAGNOSIS — F17210 Nicotine dependence, cigarettes, uncomplicated: Secondary | ICD-10-CM | POA: Insufficient documentation

## 2021-07-06 DIAGNOSIS — E113591 Type 2 diabetes mellitus with proliferative diabetic retinopathy without macular edema, right eye: Secondary | ICD-10-CM | POA: Diagnosis not present

## 2021-07-06 DIAGNOSIS — Z794 Long term (current) use of insulin: Secondary | ICD-10-CM | POA: Insufficient documentation

## 2021-07-06 HISTORY — PX: AIR/FLUID EXCHANGE: SHX6494

## 2021-07-06 HISTORY — PX: PARS PLANA VITRECTOMY: SHX2166

## 2021-07-06 HISTORY — PX: PHOTOCOAGULATION WITH LASER: SHX6027

## 2021-07-06 LAB — HEMOGLOBIN A1C
Hgb A1c MFr Bld: 7.5 % — ABNORMAL HIGH (ref 4.8–5.6)
Mean Plasma Glucose: 168.55 mg/dL

## 2021-07-06 LAB — GLUCOSE, CAPILLARY
Glucose-Capillary: 257 mg/dL — ABNORMAL HIGH (ref 70–99)
Glucose-Capillary: 255 mg/dL — ABNORMAL HIGH (ref 70–99)
Glucose-Capillary: 187 mg/dL — ABNORMAL HIGH (ref 70–99)
Glucose-Capillary: 166 mg/dL — ABNORMAL HIGH (ref 70–99)
Glucose-Capillary: 335 mg/dL — ABNORMAL HIGH (ref 70–99)
Glucose-Capillary: 286 mg/dL — ABNORMAL HIGH (ref 70–99)

## 2021-07-06 LAB — BASIC METABOLIC PANEL
Anion gap: 6 (ref 5–15)
BUN: 21 mg/dL (ref 8–23)
CO2: 26 mmol/L (ref 22–32)
Calcium: 8.7 mg/dL — ABNORMAL LOW (ref 8.9–10.3)
Chloride: 105 mmol/L (ref 98–111)
Creatinine, Ser: 0.82 mg/dL (ref 0.61–1.24)
GFR, Estimated: >60 mL/min (ref >60)
Glucose, Bld: 257 mg/dL — ABNORMAL HIGH (ref 70–99)
Potassium: 4.7 mmol/L (ref 3.5–5.1)
Sodium: 137 mmol/L (ref 135–145)

## 2021-07-06 LAB — CBC
HCT: 40.9 % (ref 39.0–52.0)
Hemoglobin: 14 g/dL (ref 13.0–17.0)
MCH: 32.2 pg (ref 26.0–34.0)
MCHC: 34.2 g/dL (ref 30.0–36.0)
MCV: 94 fL (ref 80.0–100.0)
Platelets: 246 10*3/uL (ref 150–400)
RBC: 4.35 MIL/uL (ref 4.22–5.81)
RDW: 12.7 % (ref 11.5–15.5)
WBC: 7.7 10*3/uL (ref 4.0–10.5)
nRBC: 0 % (ref 0.0–0.2)

## 2021-07-06 SURGERY — PARS PLANA VITRECTOMY WITH 25G REMOVAL/SUTURE INTRAOCULAR LENS
Anesthesia: General | Site: Eye | Laterality: Right

## 2021-07-06 MED ORDER — LIDOCAINE 2% (20 MG/ML) 5 ML SYRINGE
INTRAMUSCULAR | Status: DC | PRN
  Administered 2021-07-06: 80 mg via INTRAVENOUS

## 2021-07-06 MED ORDER — PHENYLEPHRINE HCL 2.5 % OP SOLN
1.0000 [drp] | OPHTHALMIC | Status: AC | PRN
  Administered 2021-07-06 (×3): 1 [drp] via OPHTHALMIC
  Filled 2021-07-06: qty 2

## 2021-07-06 MED ORDER — STERILE WATER FOR INJECTION IJ SOLN
INTRAMUSCULAR | Status: DC | PRN
  Administered 2021-07-06: 10 mL via INTRAOCULAR

## 2021-07-06 MED ORDER — PREDNISOLONE ACETATE 1 % OP SUSP
1.0000 [drp] | Freq: Four times a day (QID) | OPHTHALMIC | Status: DC
  Administered 2021-07-07: 1 [drp] via OPHTHALMIC
  Filled 2021-07-06 (×2): qty 5

## 2021-07-06 MED ORDER — INSULIN ASPART 100 UNIT/ML IJ SOLN
0.0000 [IU] | INTRAMUSCULAR | Status: DC
  Administered 2021-07-06: 3 [IU] via SUBCUTANEOUS
  Administered 2021-07-06: 8 [IU] via SUBCUTANEOUS
  Administered 2021-07-06: 11 [IU] via SUBCUTANEOUS
  Administered 2021-07-07 (×2): 3 [IU] via SUBCUTANEOUS

## 2021-07-06 MED ORDER — ROCURONIUM BROMIDE 10 MG/ML (PF) SYRINGE
PREFILLED_SYRINGE | INTRAVENOUS | Status: AC
  Filled 2021-07-06: qty 10

## 2021-07-06 MED ORDER — TRIAMCINOLONE ACETONIDE 40 MG/ML IJ SUSP
INTRAMUSCULAR | Status: AC
  Filled 2021-07-06: qty 5

## 2021-07-06 MED ORDER — ALBUTEROL SULFATE HFA 108 (90 BASE) MCG/ACT IN AERS
INHALATION_SPRAY | RESPIRATORY_TRACT | Status: AC
  Filled 2021-07-06: qty 6.7

## 2021-07-06 MED ORDER — BUPIVACAINE-EPINEPHRINE (PF) 0.25% -1:200000 IJ SOLN
INTRAMUSCULAR | Status: AC
  Filled 2021-07-06: qty 30

## 2021-07-06 MED ORDER — SODIUM HYALURONATE 10 MG/ML IO SOLUTION
PREFILLED_SYRINGE | INTRAOCULAR | Status: AC
  Filled 2021-07-06: qty 0.85

## 2021-07-06 MED ORDER — AMISULPRIDE (ANTIEMETIC) 5 MG/2ML IV SOLN: 10.0000 mg | Freq: Once | INTRAVENOUS | Status: DC | PRN

## 2021-07-06 MED ORDER — CEFAZOLIN SODIUM-DEXTROSE 2-4 GM/100ML-% IV SOLN
2.0000 g | INTRAVENOUS | Status: AC
  Administered 2021-07-06: 2 g via INTRAVENOUS
  Filled 2021-07-06: qty 100

## 2021-07-06 MED ORDER — INSULIN GLARGINE-YFGN 100 UNIT/ML ~~LOC~~ SOLN
16.0000 [IU] | Freq: Every day | SUBCUTANEOUS | Status: DC
  Administered 2021-07-06: 16 [IU] via SUBCUTANEOUS
  Filled 2021-07-06 (×2): qty 0.16

## 2021-07-06 MED ORDER — MAGNESIUM HYDROXIDE 400 MG/5ML PO SUSP
15.0000 mL | Freq: Four times a day (QID) | ORAL | Status: DC | PRN
  Filled 2021-07-06: qty 30

## 2021-07-06 MED ORDER — ORAL CARE MOUTH RINSE: 15.0000 mL | Freq: Once | OROMUCOSAL | Status: AC

## 2021-07-06 MED ORDER — INSULIN GLARGINE 100 UNIT/ML SOLOSTAR PEN: 16.0000 [IU] | PEN_INJECTOR | Freq: Every day | SUBCUTANEOUS | Status: DC

## 2021-07-06 MED ORDER — FENTANYL CITRATE (PF) 250 MCG/5ML IJ SOLN
INTRAMUSCULAR | Status: AC
  Filled 2021-07-06: qty 5

## 2021-07-06 MED ORDER — SUGAMMADEX SODIUM 200 MG/2ML IV SOLN
INTRAVENOUS | Status: DC | PRN
  Administered 2021-07-06: 200 mg via INTRAVENOUS

## 2021-07-06 MED ORDER — LATANOPROST 0.005 % OP SOLN
1.0000 [drp] | Freq: Every day | OPHTHALMIC | Status: DC
  Filled 2021-07-06 (×2): qty 2.5

## 2021-07-06 MED ORDER — BEVACIZUMAB CHEMO INJECTION 1.25MG/0.05ML SYRINGE FOR KALEIDOSCOPE
INTRAVITREAL | Status: DC | PRN
  Administered 2021-07-06: 1.25 mg via INTRAVITREAL

## 2021-07-06 MED ORDER — ENALAPRIL MALEATE 5 MG PO TABS
10.0000 mg | ORAL_TABLET | Freq: Every morning | ORAL | Status: DC
Start: 2021-07-07 — End: 2021-07-07
  Administered 2021-07-07: 10 mg via ORAL
  Filled 2021-07-06: qty 2

## 2021-07-06 MED ORDER — FLUTICASONE PROPIONATE 50 MCG/ACT NA SUSP
2.0000 | Freq: Two times a day (BID) | NASAL | Status: DC | PRN
  Filled 2021-07-06: qty 16

## 2021-07-06 MED ORDER — 0.9 % SODIUM CHLORIDE (POUR BTL) OPTIME
TOPICAL | Status: DC | PRN
  Administered 2021-07-06: 200 mL

## 2021-07-06 MED ORDER — HYDROCODONE-ACETAMINOPHEN 5-325 MG PO TABS
1.0000 | ORAL_TABLET | ORAL | Status: DC | PRN
  Administered 2021-07-06: 1 via ORAL
  Filled 2021-07-06: qty 1

## 2021-07-06 MED ORDER — BSS IO SOLN
INTRAOCULAR | Status: AC
  Filled 2021-07-06: qty 15

## 2021-07-06 MED ORDER — ONDANSETRON HCL 4 MG/2ML IJ SOLN
INTRAMUSCULAR | Status: DC | PRN
  Administered 2021-07-06: 4 mg via INTRAVENOUS

## 2021-07-06 MED ORDER — BRIMONIDINE TARTRATE 0.15 % OP SOLN
1.0000 [drp] | Freq: Two times a day (BID) | OPHTHALMIC | Status: DC
  Filled 2021-07-06: qty 5

## 2021-07-06 MED ORDER — MIDAZOLAM HCL 2 MG/2ML IJ SOLN
INTRAMUSCULAR | Status: DC | PRN
  Administered 2021-07-06 (×2): 1 mg via INTRAVENOUS

## 2021-07-06 MED ORDER — HYDROMORPHONE HCL 1 MG/ML IJ SOLN
0.2500 mg | INTRAMUSCULAR | Status: DC | PRN
  Administered 2021-07-06 (×2): 0.5 mg via INTRAVENOUS

## 2021-07-06 MED ORDER — DEXAMETHASONE SODIUM PHOSPHATE 10 MG/ML IJ SOLN
INTRAMUSCULAR | Status: AC
  Filled 2021-07-06: qty 1

## 2021-07-06 MED ORDER — TEMAZEPAM 15 MG PO CAPS
15.0000 mg | ORAL_CAPSULE | Freq: Every evening | ORAL | Status: DC | PRN
  Administered 2021-07-06: 15 mg via ORAL
  Filled 2021-07-06: qty 1

## 2021-07-06 MED ORDER — ACETAZOLAMIDE SODIUM 500 MG IJ SOLR
500.0000 mg | Freq: Once | INTRAMUSCULAR | Status: AC
  Administered 2021-07-07: 500 mg via INTRAVENOUS
  Filled 2021-07-06: qty 500

## 2021-07-06 MED ORDER — GATIFLOXACIN 0.5 % OP SOLN
1.0000 [drp] | OPHTHALMIC | Status: AC | PRN
  Administered 2021-07-06 (×2): 1 [drp] via OPHTHALMIC

## 2021-07-06 MED ORDER — ATROPINE SULFATE 1 % OP SOLN
OPHTHALMIC | Status: AC
  Filled 2021-07-06: qty 5

## 2021-07-06 MED ORDER — OXYCODONE HCL 5 MG PO TABS: 5.0000 mg | ORAL_TABLET | Freq: Once | ORAL | Status: DC | PRN

## 2021-07-06 MED ORDER — ACETAZOLAMIDE SODIUM 500 MG IJ SOLR
INTRAMUSCULAR | Status: AC
  Filled 2021-07-06: qty 500

## 2021-07-06 MED ORDER — STERILE WATER FOR IRRIGATION IR SOLN
Status: DC | PRN
  Administered 2021-07-06: 300 mL

## 2021-07-06 MED ORDER — BEVACIZUMAB CHEMO INJECTION 1.25MG/0.05ML SYRINGE FOR KALEIDOSCOPE
1.2500 mg | Freq: Once | INTRAVITREAL | Status: DC
  Filled 2021-07-06 (×3): qty 0.1

## 2021-07-06 MED ORDER — BSS PLUS IO SOLN
INTRAOCULAR | Status: DC | PRN
  Administered 2021-07-06: 1 via INTRAOCULAR

## 2021-07-06 MED ORDER — CEFTAZIDIME 1 G IJ SOLR
INTRAMUSCULAR | Status: AC
  Filled 2021-07-06: qty 1

## 2021-07-06 MED ORDER — HYDROMORPHONE HCL 1 MG/ML IJ SOLN
INTRAMUSCULAR | Status: AC
  Filled 2021-07-06: qty 1

## 2021-07-06 MED ORDER — BACITRACIN-POLYMYXIN B 500-10000 UNIT/GM OP OINT
TOPICAL_OINTMENT | OPHTHALMIC | Status: AC
  Filled 2021-07-06: qty 3.5

## 2021-07-06 MED ORDER — LIDOCAINE 2% (20 MG/ML) 5 ML SYRINGE
INTRAMUSCULAR | Status: AC
  Filled 2021-07-06: qty 5

## 2021-07-06 MED ORDER — DEXAMETHASONE SODIUM PHOSPHATE 10 MG/ML IJ SOLN
INTRAMUSCULAR | Status: DC | PRN
  Administered 2021-07-06: 4 mg via INTRAVENOUS

## 2021-07-06 MED ORDER — PREDNISOLONE ACETATE 1 % OP SUSP
1.0000 [drp] | Freq: Four times a day (QID) | OPHTHALMIC | Status: DC
  Filled 2021-07-06: qty 5

## 2021-07-06 MED ORDER — MIDAZOLAM HCL 2 MG/2ML IJ SOLN
INTRAMUSCULAR | Status: AC
  Filled 2021-07-06: qty 2

## 2021-07-06 MED ORDER — PROPOFOL 10 MG/ML IV BOLUS
INTRAVENOUS | Status: DC | PRN
  Administered 2021-07-06: 130 mg via INTRAVENOUS

## 2021-07-06 MED ORDER — BSS IO SOLN
INTRAOCULAR | Status: DC | PRN
  Administered 2021-07-06: 15 mL via INTRAOCULAR

## 2021-07-06 MED ORDER — ATORVASTATIN CALCIUM 40 MG PO TABS: 40.0000 mg | ORAL_TABLET | Freq: Every morning | ORAL | Status: DC

## 2021-07-06 MED ORDER — BSS PLUS IO SOLN
INTRAOCULAR | Status: AC
  Filled 2021-07-06: qty 500

## 2021-07-06 MED ORDER — GATIFLOXACIN 0.5 % OP SOLN
1.0000 [drp] | Freq: Four times a day (QID) | OPHTHALMIC | Status: DC
  Filled 2021-07-06 (×3): qty 2.5

## 2021-07-06 MED ORDER — MORPHINE SULFATE (PF) 2 MG/ML IV SOLN: 1.0000 mg | INTRAVENOUS | Status: DC | PRN

## 2021-07-06 MED ORDER — LIDOCAINE HCL 2 % IJ SOLN
INTRAMUSCULAR | Status: AC
  Filled 2021-07-06: qty 20

## 2021-07-06 MED ORDER — OXYCODONE HCL 5 MG/5ML PO SOLN: 5.0000 mg | Freq: Once | ORAL | Status: DC | PRN

## 2021-07-06 MED ORDER — POLYMYXIN B SULFATE 500000 UNITS IJ SOLR
INTRAMUSCULAR | Status: AC
  Filled 2021-07-06: qty 10

## 2021-07-06 MED ORDER — HYDROCHLOROTHIAZIDE 25 MG PO TABS
25.0000 mg | ORAL_TABLET | Freq: Every morning | ORAL | Status: DC
  Administered 2021-07-07: 25 mg via ORAL
  Filled 2021-07-06: qty 1

## 2021-07-06 MED ORDER — STERILE WATER FOR INJECTION IJ SOLN
INTRAMUSCULAR | Status: AC
  Filled 2021-07-06: qty 20

## 2021-07-06 MED ORDER — PROMETHAZINE HCL 25 MG/ML IJ SOLN: 6.2500 mg | INTRAMUSCULAR | Status: DC | PRN

## 2021-07-06 MED ORDER — SODIUM HYALURONATE 10 MG/ML IO SOLUTION
PREFILLED_SYRINGE | INTRAOCULAR | Status: DC | PRN
  Administered 2021-07-06: 0.85 mL via INTRAOCULAR

## 2021-07-06 MED ORDER — FENTANYL CITRATE (PF) 100 MCG/2ML IJ SOLN
INTRAMUSCULAR | Status: DC | PRN
  Administered 2021-07-06: 25 ug via INTRAVENOUS
  Administered 2021-07-06: 50 ug via INTRAVENOUS

## 2021-07-06 MED ORDER — PREDNISOLONE ACETATE 1 % OP SUSP: 1.0000 [drp] | Freq: Four times a day (QID) | OPHTHALMIC | Status: DC

## 2021-07-06 MED ORDER — TETRACAINE HCL 0.5 % OP SOLN
2.0000 [drp] | Freq: Once | OPHTHALMIC | Status: DC
  Filled 2021-07-06 (×2): qty 4

## 2021-07-06 MED ORDER — DEXAMETHASONE SODIUM PHOSPHATE 10 MG/ML IJ SOLN
INTRAMUSCULAR | Status: DC | PRN
  Administered 2021-07-06: 10 mg

## 2021-07-06 MED ORDER — BRIMONIDINE TARTRATE 0.2 % OP SOLN: 1.0000 [drp] | Freq: Two times a day (BID) | OPHTHALMIC | Status: DC

## 2021-07-06 MED ORDER — PHENYLEPHRINE HCL-NACL 20-0.9 MG/250ML-% IV SOLN
INTRAVENOUS | Status: DC | PRN
  Administered 2021-07-06: 30 ug/min via INTRAVENOUS

## 2021-07-06 MED ORDER — SILDENAFIL CITRATE 50 MG PO TABS: 50.0000 mg | ORAL_TABLET | Freq: Every day | ORAL | Status: DC | PRN

## 2021-07-06 MED ORDER — TROPICAMIDE 1 % OP SOLN
1.0000 [drp] | OPHTHALMIC | Status: AC | PRN
  Administered 2021-07-06 (×3): 1 [drp] via OPHTHALMIC
  Filled 2021-07-06: qty 15

## 2021-07-06 MED ORDER — INSULIN LISPRO (1 UNIT DIAL) 100 UNIT/ML (KWIKPEN): 5.0000 [IU] | PEN_INJECTOR | Freq: Three times a day (TID) | SUBCUTANEOUS | Status: DC

## 2021-07-06 MED ORDER — DEXAMETHASONE 0.1 % OP SUSP
OPHTHALMIC | Status: AC
  Filled 2021-07-06: qty 5

## 2021-07-06 MED ORDER — SODIUM CHLORIDE (PF) 0.9 % IJ SOLN
INTRAMUSCULAR | Status: AC
  Filled 2021-07-06: qty 10

## 2021-07-06 MED ORDER — STERILE WATER FOR INJECTION IJ SOLN
INTRAMUSCULAR | Status: DC | PRN
  Administered 2021-07-06: 10 mL

## 2021-07-06 MED ORDER — DORZOLAMIDE HCL 2 % OP SOLN
1.0000 [drp] | Freq: Two times a day (BID) | OPHTHALMIC | Status: DC
  Administered 2021-07-06: 1 [drp] via OPHTHALMIC
  Filled 2021-07-06: qty 10

## 2021-07-06 MED ORDER — EPINEPHRINE PF 1 MG/ML IJ SOLN
INTRAMUSCULAR | Status: DC | PRN
  Administered 2021-07-06: .3 mL

## 2021-07-06 MED ORDER — ROCURONIUM BROMIDE 10 MG/ML (PF) SYRINGE
PREFILLED_SYRINGE | INTRAVENOUS | Status: DC | PRN
  Administered 2021-07-06: 80 mg via INTRAVENOUS

## 2021-07-06 MED ORDER — ONDANSETRON HCL 4 MG/2ML IJ SOLN
INTRAMUSCULAR | Status: AC
  Filled 2021-07-06: qty 2

## 2021-07-06 MED ORDER — BUPIVACAINE HCL (PF) 0.75 % IJ SOLN
INTRAMUSCULAR | Status: DC | PRN
  Administered 2021-07-06: 10 mL

## 2021-07-06 MED ORDER — CHLORHEXIDINE GLUCONATE 0.12 % MT SOLN
15.0000 mL | Freq: Once | OROMUCOSAL | Status: AC
  Administered 2021-07-06: 15 mL via OROMUCOSAL
  Filled 2021-07-06: qty 15

## 2021-07-06 MED ORDER — SODIUM CHLORIDE 0.9 % IV SOLN: INTRAVENOUS | Status: DC

## 2021-07-06 MED ORDER — SODIUM CHLORIDE 0.45 % IV SOLN: INTRAVENOUS | Status: DC

## 2021-07-06 MED ORDER — BACITRACIN-POLYMYXIN B 500-10000 UNIT/GM OP OINT: TOPICAL_OINTMENT | OPHTHALMIC | Status: DC | PRN

## 2021-07-06 MED ORDER — FREESTYLE LIBRE READER DEVI: Freq: Once | Status: DC

## 2021-07-06 MED ORDER — EPINEPHRINE PF 1 MG/ML IJ SOLN
INTRAMUSCULAR | Status: AC
  Filled 2021-07-06: qty 1

## 2021-07-06 MED ORDER — CYCLOPENTOLATE HCL 1 % OP SOLN
1.0000 [drp] | OPHTHALMIC | Status: AC | PRN
  Administered 2021-07-06 (×3): 1 [drp] via OPHTHALMIC
  Filled 2021-07-06: qty 2

## 2021-07-06 MED ORDER — ACETAMINOPHEN 325 MG PO TABS
325.0000 mg | ORAL_TABLET | ORAL | Status: DC | PRN
  Filled 2021-07-06: qty 2

## 2021-07-06 MED ORDER — ONDANSETRON HCL 4 MG/2ML IJ SOLN
4.0000 mg | Freq: Four times a day (QID) | INTRAMUSCULAR | Status: DC | PRN
  Filled 2021-07-06: qty 2

## 2021-07-06 MED ORDER — ALBUTEROL SULFATE HFA 108 (90 BASE) MCG/ACT IN AERS
INHALATION_SPRAY | RESPIRATORY_TRACT | Status: DC | PRN
  Administered 2021-07-06: 2 via RESPIRATORY_TRACT

## 2021-07-06 MED ORDER — BACITRACIN-POLYMYXIN B 500-10000 UNIT/GM OP OINT
1.0000 "application " | TOPICAL_OINTMENT | Freq: Three times a day (TID) | OPHTHALMIC | Status: DC
  Administered 2021-07-06: 1 via OPHTHALMIC
  Filled 2021-07-06: qty 3.5

## 2021-07-06 MED ORDER — INSULIN ASPART 100 UNIT/ML IJ SOLN
0.0000 [IU] | INTRAMUSCULAR | Status: AC | PRN
  Administered 2021-07-06 (×2): 3 [IU] via SUBCUTANEOUS

## 2021-07-06 MED ORDER — BUPIVACAINE HCL (PF) 0.75 % IJ SOLN
INTRAMUSCULAR | Status: AC
  Filled 2021-07-06: qty 10

## 2021-07-06 MED ORDER — GATIFLOXACIN 0.5 % OP SOLN
1.0000 [drp] | OPHTHALMIC | Status: AC | PRN
  Administered 2021-07-06: 1 [drp] via OPHTHALMIC
  Filled 2021-07-06: qty 2.5

## 2021-07-06 MED ORDER — DORZOLAMIDE HCL 2 % OP SOLN
1.0000 [drp] | Freq: Three times a day (TID) | OPHTHALMIC | Status: DC
  Administered 2021-07-06: 1 [drp] via OPHTHALMIC
  Filled 2021-07-06: qty 10

## 2021-07-06 MED ORDER — TIMOLOL MALEATE 0.25 % OP SOLN
1.0000 [drp] | Freq: Two times a day (BID) | OPHTHALMIC | Status: DC
  Administered 2021-07-06: 1 [drp] via OPHTHALMIC
  Filled 2021-07-06: qty 5

## 2021-07-06 MED ORDER — MEPERIDINE HCL 25 MG/ML IJ SOLN: 6.2500 mg | INTRAMUSCULAR | Status: DC | PRN

## 2021-07-06 SURGICAL SUPPLY — 52 items
BAG COUNTER SPONGE SURGICOUNT (BAG) ×2 IMPLANT
BLADE KERATOME 2.75 (BLADE) ×2 IMPLANT
BNDG EYE OVAL (GAUZE/BANDAGES/DRESSINGS) ×1 IMPLANT
CABLE BIPOLOR RESECTION CORD (MISCELLANEOUS) ×2 IMPLANT
CANNULA VLV SOFT TIP 25G (OPHTHALMIC) ×1 IMPLANT
CANNULA VLV SOFT TIP 25GA (OPHTHALMIC) ×2 IMPLANT
COTTONBALL LRG STERILE PKG (GAUZE/BANDAGES/DRESSINGS) ×6 IMPLANT
COVER MAYO STAND STRL (DRAPES) ×2 IMPLANT
DRAPE OPHTHALMIC 77X100 STRL (CUSTOM PROCEDURE TRAY) ×2 IMPLANT
FORCEPS GRIESHABER ILM 25G A (INSTRUMENTS) ×2 IMPLANT
GLOVE SURG 8.5 LATEX PF (GLOVE) ×2 IMPLANT
GLOVE SURG MICRO LTX SZ6.5 (GLOVE) ×4 IMPLANT
GLOVE SURG MICRO LTX SZ7 (GLOVE) ×2 IMPLANT
GLOVE TRIUMPH SURG SIZE 8.5 (KITS) ×2 IMPLANT
GOWN STRL REUS W/ TWL LRG LVL3 (GOWN DISPOSABLE) ×3 IMPLANT
GOWN STRL REUS W/TWL LRG LVL3 (GOWN DISPOSABLE) ×6
HANDLE PNEUMATIC FOR CONSTEL (OPHTHALMIC) ×2 IMPLANT
KIT BASIN OR (CUSTOM PROCEDURE TRAY) ×2 IMPLANT
KIT TURNOVER KIT B (KITS) ×2 IMPLANT
LENS IOL POST 1PIECE DIOP 20.5 (Intraocular Lens) ×1 IMPLANT
NDL 18GX1X1/2 (RX/OR ONLY) (NEEDLE) ×1 IMPLANT
NDL 25GX 5/8IN NON SAFETY (NEEDLE) ×1 IMPLANT
NDL 27GX1/2 REG BEVEL ECLIP (NEEDLE) IMPLANT
NDL FILTER BLUNT 18X1 1/2 (NEEDLE) ×1 IMPLANT
NDL HYPO 30X.5 LL (NEEDLE) ×1 IMPLANT
NEEDLE 18GX1X1/2 (RX/OR ONLY) (NEEDLE) ×2 IMPLANT
NEEDLE 25GX 5/8IN NON SAFETY (NEEDLE) ×2 IMPLANT
NEEDLE 27GX1/2 REG BEVEL ECLIP (NEEDLE) ×2 IMPLANT
NEEDLE FILTER BLUNT 18X 1/2SAF (NEEDLE) ×1
NEEDLE FILTER BLUNT 18X1 1/2 (NEEDLE) ×1 IMPLANT
NEEDLE HYPO 30X.5 LL (NEEDLE) ×2 IMPLANT
NS IRRIG 1000ML POUR BTL (IV SOLUTION) ×2 IMPLANT
PACK VITRECTOMY CUSTOM (CUSTOM PROCEDURE TRAY) ×2 IMPLANT
PAD ARMBOARD 7.5X6 YLW CONV (MISCELLANEOUS) ×4 IMPLANT
PAK PIK VITRECTOMY CVS 25GA (OPHTHALMIC) ×2 IMPLANT
PIC ILLUMINATED 25G (OPHTHALMIC) ×2
PIK ILLUMINATED 25G (OPHTHALMIC) ×1 IMPLANT
PROBE LASER ILLUM FLEX CVD 25G (OPHTHALMIC) ×1 IMPLANT
ROLLS DENTAL (MISCELLANEOUS) ×4 IMPLANT
SHIELD EYE LENSE ONLY DISP (GAUZE/BANDAGES/DRESSINGS) ×1 IMPLANT
SPEAR EYE SURG WECK-CEL (MISCELLANEOUS) ×5 IMPLANT
SPONGE SURGIFOAM ABS GEL 12-7 (HEMOSTASIS) ×2 IMPLANT
SUT CHROMIC 7 0 TG140 8 (SUTURE) ×2 IMPLANT
SUT ETHILON 10 0 CS140 6 (SUTURE) ×2 IMPLANT
SUT POLY NON ABSORB 10-0 8 STR (SUTURE) ×4 IMPLANT
SUT SILK 4 0 RB 1 (SUTURE) ×1 IMPLANT
SYR 20ML LL LF (SYRINGE) ×2 IMPLANT
SYR BULB EAR ULCER 3OZ GRN STR (SYRINGE) ×2 IMPLANT
SYR TB 1ML LUER SLIP (SYRINGE) ×2 IMPLANT
TAPE SURG TRANSPORE 1 IN (GAUZE/BANDAGES/DRESSINGS) ×1 IMPLANT
TAPE SURGICAL TRANSPORE 1 IN (GAUZE/BANDAGES/DRESSINGS) ×2
WATER STERILE IRR 1000ML POUR (IV SOLUTION) ×2 IMPLANT

## 2021-07-06 NOTE — Anesthesia Procedure Notes (Signed)
Procedure Name: Intubation ?Date/Time: 07/06/2021 11:45 AM ?Performed by: Genelle Bal, CRNA ?Pre-anesthesia Checklist: Patient identified, Emergency Drugs available, Suction available and Patient being monitored ?Patient Re-evaluated:Patient Re-evaluated prior to induction ?Oxygen Delivery Method: Circle system utilized ?Preoxygenation: Pre-oxygenation with 100% oxygen ?Induction Type: IV induction ?Ventilation: Mask ventilation without difficulty ?Laryngoscope Size: Sabra Heck and 2 ?Grade View: Grade I ?Tube type: Oral ?Tube size: 7.5 mm ?Number of attempts: 1 ?Airway Equipment and Method: Stylet and Oral airway ?Placement Confirmation: ETT inserted through vocal cords under direct vision, positive ETCO2 and breath sounds checked- equal and bilateral ?Secured at: 22.5 cm ?Tube secured with: Tape ?Dental Injury: Teeth and Oropharynx as per pre-operative assessment  ? ? ? ? ?

## 2021-07-06 NOTE — Transfer of Care (Signed)
Immediate Anesthesia Transfer of Care Note ? ?Patient: Douglas [name]. ? ?Procedure(s) Performed: TWENTY-FIVE GAUGE PARS PLANA VITRECTOMY WITH REMOVAL/SUTURE INTRAOCULAR LENS (Right: Eye) ?PHOTOCOAGULATION WITH LASER (Right: Eye) ?AIR/FLUID EXCHANGE (Right: Eye) ?AVASTIN INJECTION (Right: Eye) ? ?Patient Location: PACU ? ?Anesthesia Type:General ? ?Level of Consciousness: awake, alert  and oriented ? ?Airway & Oxygen Therapy: Patient Spontanous Breathing and Patient connected to face mask oxygen ? ?Post-op Assessment: Report given to RN and Post -op Vital signs reviewed and stable ? ?Post vital signs: Reviewed and stable ? ?Last Vitals:  ?Vitals Value Taken Time  ?BP 152/68 07/06/21 1412  ?Temp    ?Pulse 67 07/06/21 1413  ?Resp 21 07/06/21 1413  ?SpO2 100 % 07/06/21 1413  ?Vitals shown include unvalidated device data. ? ?Last Pain:  ?Vitals:  ? 07/06/21 0938  ?TempSrc:   ?PainSc: 0-No pain  ?   ? ?  ? ?Complications: No notable events documented. ?

## 2021-07-06 NOTE — H&P (Signed)
I examined the patient today and there is no change in the medical status 

## 2021-07-06 NOTE — Anesthesia Postprocedure Evaluation (Signed)
Anesthesia Post Note ? ?Patient: Douglas [name]. ? ?Procedure(s) Performed: TWENTY-FIVE GAUGE PARS PLANA VITRECTOMY WITH REMOVAL/SUTURE INTRAOCULAR LENS (Right: Eye) ?PHOTOCOAGULATION WITH LASER (Right: Eye) ?AIR/FLUID EXCHANGE (Right: Eye) ?AVASTIN INJECTION (Right: Eye) ? ?  ? ?Patient location during evaluation: PACU ?Anesthesia Type: General ?Level of consciousness: awake and alert ?Pain management: pain level controlled ?Vital Signs Assessment: post-procedure vital signs reviewed and stable ?Respiratory status: spontaneous breathing, nonlabored ventilation and respiratory function stable ?Cardiovascular status: blood pressure returned to baseline and stable ?Postop Assessment: no apparent nausea or vomiting ?Anesthetic complications: no ? ? ?No notable events documented. ? ?Last Vitals:  ?Vitals:  ? 07/06/21 1445 07/06/21 1515  ?BP: 139/70 128/63  ?Pulse: 60 61  ?Resp: 10 14  ?Temp:    ?SpO2: 92% 93%  ?  ?Last Pain:  ?Vitals:  ? 07/06/21 1445  ?TempSrc:   ?PainSc: Asleep  ? ? ?  ?  ?  ?  ?  ?  ? ?Douglas [name] ? ? ? ? ?

## 2021-07-06 NOTE — Op Note (Signed)
NAME: Douglas [name], Douglas L. ?MEDICAL RECORD NO: [id number] ?ACCOUNT NO: [id number] ?DATE OF BIRTH: 09/11/1950 ?FACILITY: [facility] ?LOCATION: [facility] ?PHYSICIAN: Chrystie Nose. Zigmund Daniel, MD ? ?Operative Report  ? ?DATE OF PROCEDURE: 07/06/2021 ? ? ?ADMISSION DIAGNOSIS:   ?1.  Dislocated intraocular lens, right eye. ?2.  Proliferative diabetic retinopathy, right eye. ? ?PROCEDURES: Pars plana vitrectomy, panretinal photocoagulation, removal of a dislocated intraocular lens from the vitreous, placement of secondary intraocular lens in the ciliary sulcus with suture, gas fluid exchange and laser. ? ?SURGEON:  Dr. Tempie Hoist. ? ?ASSISTANT: Deatra Ina, SA ? ?ANESTHESIA:  General.   ? ?DETAILS: Usual prep and drape.  Conjunctival peritomy from 8 o'clock around to 4 o'clock to allow access to the limbus.  Half thickness scleral flaps were raised at 3 o'clock and 9 o'clock in anticipation of IOL suture.  A corneal scleral wound was  ?created in a 3-layered fashion between 10 o'clock and 2 o'clock, 8 mm in length, 25-gauge trocars were placed at 8, 10 and 2 o'clock.  Pars plana vitrectomy was performed in the pupillary axis.  There was some blood and vitreous encountered.  The  ?vitrectomy was carried posteriorly and the BIOM viewing system was moved into place.  The posterior pole became apparent and the intraocular lens was seen in the bag totally dislocated and lying on the macular surface.  The lens was in snared and  ?vitreous.  The vitrectomy was performed to remove all vitreous from its attachments to the intraocular lens.  Then, the core vitrectomy was completed, then the vitrectomy was carried into the mid periphery and the far periphery where all vitreous was  ?removed and all blood was removed. The endolaser was positioned in the eye, 437 burns were placed around the retinal periphery.  The power was 300 milliwatts, 1000 microns each and 0.1 seconds each.  The corneal scleral wound was opened.  The intraocular ? lens was passed  from the vitreous into the anterior chamber and out through the corneoscleral wound.  A new intraocular lens was brought onto the field, made by [employer] 20.5D, length 12.5 mm, optic 7.0 mm, serial  ?#[id number], expiration date 02/16/2023.  The lens was brought onto the field, inspected and cleaned.  Two Prolene sutures were passed beneath the scleral flaps posterior to the iris in the ciliary sulcus from 3 o'clock to 9 o'clock.  A docking needle  ?maneuver was used for each of the 2 Prolene sutures.  The sutures were then drawn out through the corneoscleral wound with serrated 20-gauge vitreous forceps.   The Prolene sutures were attached securely to the intraocular lens eyelets.  The lens was  ?then passed through the corneal scleral wound into the anterior chamber, then through the pupil and into the sulcus, the lens was dialed into place.  The Prolene sutures were drawn securely beneath the scleral flaps.  They were knotted and the free ends  ?were removed.  The scleral flaps were allowed to cover these knots.  The corneoscleral wound was closed with five interrupted 10-0 nylon sutures.  The wound was tested and found to be secure.  Additional vitrectomy was carried out at this time.  [place]isc  ?was placed on the corneal surface.  The vitrectomy was carried posteriorly.  Minimal blood was seen, but some debris was found.  This was carefully removed and rinsed so that the entire vitreous cavity was clear.  A 50% gas fluid exchange was then  ?carried out.  The lens was in secure position.  The wound was tested and found to be secure.  The trocars were removed from the limbus in each location and all instruments were removed from the eye.  The conjunctiva was reposited with 7-0 chromic suture. ?  Polymyxin and ceftazidime were rinsed around the globe for postoperative antibiotic coverage.  Decadron 10 mg was injected into the lower conjunctival space.  Atropine solution was  applied.  Marcaine was rinsed around the globe for postoperative pain.  ? Polysporin ophthalmic ointment was placed.  The closing pressure was 15 with a Barraquer tonometer.  A patch and shield were placed.  The patient was awakened and taken to recovery in satisfactory condition. ? ?COMPLICATIONS:  None.   ? ?Duration 2 hours. ? ? ? ?[place] ?D: 07/06/2021 2:09:31 pm T: 07/06/2021 10:03:00 pm  ?JOB: [id number]/ [id number]  ?

## 2021-07-06 NOTE — Brief Op Note (Signed)
Brief Operative note ? ? ?Preoperative diagnosis:  Dislocated Intra Ocular Lens right eye ?Postoperative diagnosis  * No Diagnosis Codes entered * ? ?Procedures: Pars plana vitrectomy, laser, remove IOL from the vitreous, placement of secondary IOL into the sulcus with suture, gas injection, right eye  ? ?Surgeon:  [name], MD... ? ?Assistant:  Deatra Ina SA   ? ?Anesthesia: General ? ?Specimen: none ? ?Estimated blood loss:  1cc ? ?Complications: none ? ?Patient sent to PACU in good condition ? ?Composed by [name] MD ? ?Dictation number: [id number]  maybe [id number]  ?

## 2021-07-07 ENCOUNTER — Ambulatory Visit: Payer: Medicare Other | Admitting: Urology

## 2021-07-07 DIAGNOSIS — Z8546 Personal history of malignant neoplasm of prostate: Secondary | ICD-10-CM | POA: Diagnosis not present

## 2021-07-07 DIAGNOSIS — I10 Essential (primary) hypertension: Secondary | ICD-10-CM | POA: Diagnosis not present

## 2021-07-07 DIAGNOSIS — E113591 Type 2 diabetes mellitus with proliferative diabetic retinopathy without macular edema, right eye: Secondary | ICD-10-CM | POA: Diagnosis not present

## 2021-07-07 DIAGNOSIS — J449 Chronic obstructive pulmonary disease, unspecified: Secondary | ICD-10-CM | POA: Diagnosis not present

## 2021-07-07 DIAGNOSIS — E7800 Pure hypercholesterolemia, unspecified: Secondary | ICD-10-CM | POA: Diagnosis not present

## 2021-07-07 DIAGNOSIS — Z794 Long term (current) use of insulin: Secondary | ICD-10-CM | POA: Diagnosis not present

## 2021-07-07 DIAGNOSIS — T8522XA Displacement of intraocular lens, initial encounter: Secondary | ICD-10-CM | POA: Diagnosis not present

## 2021-07-07 DIAGNOSIS — F17210 Nicotine dependence, cigarettes, uncomplicated: Secondary | ICD-10-CM | POA: Diagnosis not present

## 2021-07-07 LAB — GLUCOSE, CAPILLARY
Glucose-Capillary: 176 mg/dL — ABNORMAL HIGH (ref 70–99)
Glucose-Capillary: 178 mg/dL — ABNORMAL HIGH (ref 70–99)
Glucose-Capillary: 265 mg/dL — ABNORMAL HIGH (ref 70–99)

## 2021-07-07 LAB — SURGICAL PATHOLOGY

## 2021-07-07 MED ORDER — PREDNISOLONE ACETATE 1 % OP SUSP: 1.0000 [drp] | Freq: Four times a day (QID) | OPHTHALMIC | 0 refills | Status: DC

## 2021-07-07 MED ORDER — BACITRACIN-POLYMYXIN B 500-10000 UNIT/GM OP OINT: 1.0000 "application " | TOPICAL_OINTMENT | Freq: Three times a day (TID) | OPHTHALMIC | 0 refills | Status: DC

## 2021-07-07 MED ORDER — GATIFLOXACIN 0.5 % OP SOLN: 1.0000 [drp] | Freq: Four times a day (QID) | OPHTHALMIC | Status: DC

## 2021-07-07 MED ORDER — DORZOLAMIDE HCL 2 % OP SOLN: 1.0000 [drp] | Freq: Two times a day (BID) | OPHTHALMIC | 12 refills | Status: AC

## 2021-07-07 MED ORDER — STERILE WATER FOR INJECTION IJ SOLN
INTRAMUSCULAR | Status: AC
  Filled 2021-07-07: qty 10

## 2021-07-07 MED ORDER — BRIMONIDINE TARTRATE 0.2 % OP SOLN: 1.0000 [drp] | Freq: Two times a day (BID) | OPHTHALMIC | 12 refills | Status: AC

## 2021-07-07 NOTE — Progress Notes (Signed)
07/07/2021, 6:38 AM ? ?Mental Status:  Awake, Alert, Oriented ? ?Anterior segment: Cornea  Clear ?   Anterior Chamber Clear ?   Lens:    IOL ? ?Intra Ocular Pressure 23 mmHg with Tonopen ? ?Vitreous: Clear 20%gas bubble  ? ?Retina:  Attached Good laser reaction  ?Impression: Excellent result Retina attached  ?Final Diagnosis: Principal Problem: ?  Posterior dislocation of intraocular lens of right eye ?Active Problems: ?  Dislocated IOL (intraocular lens), posterior, right ? ? ?Plan: start post operative eye drops.  Discharge to home.  Give post operative instructions ? ?Douglas [name] ?07/07/2021, 6:38 AM  ?

## 2021-07-08 ENCOUNTER — Encounter (HOSPITAL_COMMUNITY): Payer: Self-pay | Admitting: Ophthalmology

## 2021-07-13 ENCOUNTER — Encounter (INDEPENDENT_AMBULATORY_CARE_PROVIDER_SITE_OTHER): Payer: Medicare Other | Admitting: Ophthalmology

## 2021-07-13 ENCOUNTER — Other Ambulatory Visit: Payer: Self-pay

## 2021-07-13 DIAGNOSIS — Z961 Presence of intraocular lens: Secondary | ICD-10-CM

## 2021-07-23 ENCOUNTER — Encounter (INDEPENDENT_AMBULATORY_CARE_PROVIDER_SITE_OTHER): Payer: Medicare Other | Admitting: Ophthalmology

## 2021-07-23 ENCOUNTER — Ambulatory Visit (INDEPENDENT_AMBULATORY_CARE_PROVIDER_SITE_OTHER): Payer: Medicare Other | Admitting: Urology

## 2021-07-23 VITALS — BP 143/74 | HR 69

## 2021-07-23 DIAGNOSIS — N5201 Erectile dysfunction due to arterial insufficiency: Secondary | ICD-10-CM

## 2021-07-23 DIAGNOSIS — C61 Malignant neoplasm of prostate: Secondary | ICD-10-CM

## 2021-07-23 DIAGNOSIS — R351 Nocturia: Secondary | ICD-10-CM

## 2021-07-23 LAB — URINALYSIS, ROUTINE W REFLEX MICROSCOPIC
Bilirubin, UA: NEGATIVE
Glucose, UA: NEGATIVE
Leukocytes,UA: NEGATIVE
Nitrite, UA: NEGATIVE
Protein,UA: NEGATIVE
RBC, UA: NEGATIVE
Specific Gravity, UA: 1.02 (ref 1.005–1.030)
Urobilinogen, Ur: 0.2 mg/dL (ref 0.2–1.0)
pH, UA: 6 (ref 5.0–7.5)

## 2021-07-23 MED ORDER — SILDENAFIL CITRATE 50 MG PO TABS: 50.0000 mg | ORAL_TABLET | Freq: Every day | ORAL | 6 refills | Status: DC | PRN

## 2021-07-23 NOTE — Progress Notes (Signed)
? ?07/23/2021 ?9:12 AM  ? ?Douglas [name]. ?06/04/1950 ?[id number] ? ?Referring provider: Alanson Puls [facility] ?[address] ?[place],  [place] [address] ? ?Followup prostate cancer, nocturia, and erectile dysfunction ? ? ?HPI: ?Douglas [name] is a 71yo here for followup for prostate cancer, nocturia, and erectile dysfunction. No recent PSA. IPSS 5 QOL 2 on no BPH therapy. Nocturia 1-2x wit decreasing fluids within 2 hours of going to bed. Urine stream strong. No straining to urinate. He uses sildenafil '50mg'$  PRN for his erectile dysfunction with good results.  ? ? ?PMH: ?Past Medical History:  ?Diagnosis Date  ? COPD (chronic obstructive pulmonary disease) ([place])   ? no inhalers used  ? DM type 1 (diabetes mellitus, type 1) ([place])   ? Dyspnea   ? with heavy exertion  ? Glaucoma   ? Hypercholesteremia   ? Hypertension   ? Prostate cancer ([place])   ? Wears glasses   ? for reading  ? ? ?Surgical History: ?Past Surgical History:  ?Procedure Laterality Date  ? AIR/FLUID EXCHANGE Left 01/26/2021  ? Procedure: AIR/FLUID EXCHANGE;  Surgeon: [name], MD;  Location: [place];  Service: Ophthalmology;  Laterality: Left;  ? AIR/FLUID EXCHANGE Right 07/06/2021  ? Procedure: AIR/FLUID EXCHANGE;  Surgeon: [name], MD;  Location: [place];  Service: Ophthalmology;  Laterality: Right;  ? COLONOSCOPY N/A 04/05/2013  ? Procedure: COLONOSCOPY;  Surgeon: Danie Binder, MD;  [facility];  Service: Endoscopy;  Laterality: N/A;  8:30 AM  ? CYSTOSCOPY N/A 07/20/2020  ? Procedure: CYSTOSCOPY;  Surgeon: Cleon Gustin, MD;  Location: [facility];  Service: Urology;  Laterality: N/A;  NO SEEDS DETECTED BY DR. Alyson Ingles  ? INTRAOCULAR LENS REMOVAL Left 01/26/2021  ? Procedure: REMOVAL OF INTRAOCULAR LENS FROM THE VITREOUS;  Surgeon: [name], MD;  Location: [place];  Service: Ophthalmology;  Laterality: Left;  ? PARS PLANA VITRECTOMY Left 01/26/2021  ? Procedure: PARS PLANA VITRECTOMY WITH 25G;  Surgeon:  [name], MD;  Location: [place];  Service: Ophthalmology;  Laterality: Left;  ? PARS PLANA VITRECTOMY Right 07/06/2021  ? Procedure: TWENTY-FIVE GAUGE PARS PLANA VITRECTOMY WITH REMOVAL/SUTURE INTRAOCULAR LENS;  Surgeon: [name], MD;  Location: [place];  Service: Ophthalmology;  Laterality: Right;  ? PHOTOCOAGULATION WITH LASER Right 07/06/2021  ? Procedure: PHOTOCOAGULATION WITH LASER;  Surgeon: [name], MD;  Location: [place];  Service: Ophthalmology;  Laterality: Right;  ? PLACEMENT AND SUTURE OF SECONDARY INTRAOCULAR LENS Left 01/26/2021  ? Procedure: PLACEMENT AND SUTURE OF SECONDARY INTRAOCULAR LENS;  Surgeon: [name], MD;  Location: [place];  Service: Ophthalmology;  Laterality: Left;  ? PROSTATE BIOPSY    ? RADIOACTIVE SEED IMPLANT N/A 07/20/2020  ? Procedure: RADIOACTIVE SEED IMPLANT/BRACHYTHERAPY IMPLANT;  Surgeon: Cleon Gustin, MD;  Location: [facility];  Service: Urology;  Laterality: N/A;  54 SEEDS IMPLANTED  ? SPACE OAR INSTILLATION N/A 07/20/2020  ? Procedure: SPACE OAR INSTILLATION;  Surgeon: Cleon Gustin, MD;  Location: [facility];  Service: Urology;  Laterality: N/A;  ? ? ?Home Medications:  ?Allergies as of 07/23/2021   ?No Known Allergies ?  ? ?  ?Medication List  ?  ? ?  ? Accurate as of July 23, 2021  9:12 AM. If you have any questions, ask your nurse or doctor.  ?  ?  ? ?  ? ?atorvastatin 40 MG tablet ?Commonly known as: LIPITOR ?Take  40 mg by mouth in the morning. ?  ?bacitracin-polymyxin b ophthalmic ointment ?Commonly known as: POLYSPORIN ?Place 1 application. into the right eye 3 (three) times daily. apply to eye every 12 hours while awake ?  ?brimonidine 0.2 % ophthalmic solution ?Commonly known as: ALPHAGAN ?Place 1 drop into both eyes 2 (two) times daily. ?  ?brimonidine 0.2 % ophthalmic solution ?Commonly known as: ALPHAGAN ?Place 1 drop into both eyes 2 (two) times daily. ?  ?dorzolamide 2 % ophthalmic  solution ?Commonly known as: TRUSOPT ?Place 1 drop into both eyes 2 (two) times daily. ?  ?dorzolamide 2 % ophthalmic solution ?Commonly known as: TRUSOPT ?Place 1 drop into both eyes 2 (two) times daily. ?  ?enalapril 10 MG tablet ?Commonly known as: VASOTEC ?Take 10 mg by mouth in the morning. ?  ?FLORAJEN3 PO ?Take 1 tablet by mouth daily. ?  ?fluticasone 50 MCG/ACT nasal spray ?Commonly known as: FLONASE ?Place 2 sprays into both nostrils 2 (two) times daily as needed for allergies. ?  ?FreeStyle [employer] ?by Does not apply route. Will be on left arm dos ?  ?gatifloxacin 0.5 % Soln ?Commonly known as: ZYMAXID ?Place 1 drop into the left eye 4 (four) times daily. ?  ?gatifloxacin 0.5 % Soln ?Commonly known as: ZYMAXID ?Place 1 drop into the right eye 4 (four) times daily. ?  ?hydrochlorothiazide 25 MG tablet ?Commonly known as: HYDRODIURIL ?Take 25 mg by mouth in the morning. ?  ?insulin lispro 100 UNIT/ML KwikPen ?Commonly known as: HUMALOG ?Inject 5-25 Units into the skin with breakfast, with lunch, and with evening meal. Sliding scale as needed ?1 to 6 carb ratio ?  ?Lantus SoloStar 100 UNIT/ML Solostar Pen ?Generic drug: insulin glargine ?Inject 16 Units into the skin at bedtime. ?  ?NON FORMULARY ?Inject 1 Syringe into the eye every 6 (six) weeks. ?  ?POLYMYXIN B-TRIMETHOPRIM OP ?Apply 1 drop to eye See admin instructions. The day of & the day after eye injection ?  ?prednisoLONE acetate 1 % ophthalmic suspension ?Commonly known as: PRED FORTE ?Place 1 drop into the left eye 4 (four) times daily. ?  ?prednisoLONE acetate 1 % ophthalmic suspension ?Commonly known as: PRED FORTE ?Place 1 drop into the right eye 4 (four) times daily. ?  ?prednisoLONE acetate 1 % ophthalmic suspension ?Commonly known as: PRED FORTE ?Place 1 drop into the right eye 4 (four) times daily. ?  ?sildenafil 50 MG tablet ?Commonly known as: VIAGRA ?Take 50 mg by mouth daily as needed for erectile dysfunction. ?  ?timolol 0.25 %  ophthalmic solution ?Commonly known as: TIMOPTIC ?Place 1 drop into both eyes 2 (two) times daily. ?  ? ?  ? ? ?Allergies: No Known Allergies ? ?Family History: ?Family History  ?Problem Relation Age of Onset  ? Breast cancer Mother   ? Colon cancer Neg Hx   ? Pancreatic cancer Neg Hx   ? Prostate cancer Neg Hx   ? ? ?Social History:  reports that he has been smoking cigarettes. He has a 35.00 pack-year smoking history. He has never used smokeless tobacco. He reports that he does not drink alcohol and does not use drugs. ? ?ROS: ?All other review of systems were reviewed and are negative except what is noted above in HPI ? ?Physical Exam: ?BP (!) 143/74   Pulse 69   ?Constitutional:  Alert and oriented, No acute distress. ?HEENT: [place] AT, moist mucus membranes.  Trachea midline, no masses. ?Cardiovascular: No clubbing, cyanosis, or edema. ?  Respiratory: Normal respiratory effort, no increased work of breathing. ?GI: Abdomen is soft, nontender, nondistended, no abdominal masses ?GU: No CVA tenderness.  ?Lymph: No cervical or inguinal lymphadenopathy. ?Skin: No rashes, bruises or suspicious lesions. ?Neurologic: Grossly intact, no focal deficits, moving all 4 extremities. ?Psychiatric: Normal mood and affect. ? ?Laboratory Data: ?Lab Results  ?Component Value Date  ? WBC 7.7 07/06/2021  ? HGB 14.0 07/06/2021  ? HCT 40.9 07/06/2021  ? MCV 94.0 07/06/2021  ? PLT 246 07/06/2021  ? ? ?Lab Results  ?Component Value Date  ? CREATININE 0.82 07/06/2021  ? ? ?No results found for: PSA ? ?No results found for: TESTOSTERONE ? ?Lab Results  ?Component Value Date  ? HGBA1C 7.5 (H) 07/06/2021  ? ? ?Urinalysis ?   ?Component Value Date/Time  ? COLORURINE AMBER (A) 08/01/2020 2836  ? APPEARANCEUR Cloudy (A) 09/28/2020 1339  ? LABSPEC 1.009 08/01/2020 0838  ? PHURINE 6.0 08/01/2020 0838  ? GLUCOSEU Negative 09/28/2020 1339  ? HGBUR MODERATE (A) 08/01/2020 6294  ? BILIRUBINUR Negative 09/28/2020 1339  ? KETONESUR 20 (A) 08/01/2020 7654   ? PROTEINUR 3+ (A) 09/28/2020 1339  ? PROTEINUR 100 (A) 08/01/2020 6503  ? NITRITE Positive (A) 09/28/2020 1339  ? NITRITE NEGATIVE 08/01/2020 0838  ? LEUKOCYTESUR 2+ (A) 09/28/2020 1339  ? LEUKOCYTESUR LARGE (A)

## 2021-07-24 LAB — PSA: Prostate Specific Ag, Serum: 0.4 ng/mL (ref 0.0–4.0)

## 2021-08-02 ENCOUNTER — Encounter (INDEPENDENT_AMBULATORY_CARE_PROVIDER_SITE_OTHER): Payer: Medicare Other | Admitting: Ophthalmology

## 2021-08-02 DIAGNOSIS — I10 Essential (primary) hypertension: Secondary | ICD-10-CM | POA: Diagnosis not present

## 2021-08-02 DIAGNOSIS — E113513 Type 2 diabetes mellitus with proliferative diabetic retinopathy with macular edema, bilateral: Secondary | ICD-10-CM

## 2021-08-02 DIAGNOSIS — H35033 Hypertensive retinopathy, bilateral: Secondary | ICD-10-CM | POA: Diagnosis not present

## 2021-08-03 ENCOUNTER — Encounter: Payer: Self-pay | Admitting: Urology

## 2021-08-03 NOTE — Patient Instructions (Signed)

## 2021-08-11 DIAGNOSIS — H40043 Steroid responder, bilateral: Secondary | ICD-10-CM | POA: Diagnosis not present

## 2021-08-11 DIAGNOSIS — H401131 Primary open-angle glaucoma, bilateral, mild stage: Secondary | ICD-10-CM | POA: Diagnosis not present

## 2021-08-24 DIAGNOSIS — H40043 Steroid responder, bilateral: Secondary | ICD-10-CM | POA: Diagnosis not present

## 2021-08-24 DIAGNOSIS — H401131 Primary open-angle glaucoma, bilateral, mild stage: Secondary | ICD-10-CM | POA: Diagnosis not present

## 2021-09-02 ENCOUNTER — Telehealth: Payer: Self-pay

## 2021-09-02 NOTE — Telephone Encounter (Signed)
Please see patient message.

## 2021-09-02 NOTE — Telephone Encounter (Signed)
Patient left a voice message on 09-01-2021:  Wanting to let Dr. Alyson Ingles know he wants to move forward with radiation treatments.  Please advise.  Call back:  [phone]  Thanks, Helene Kelp

## 2021-09-03 ENCOUNTER — Encounter (INDEPENDENT_AMBULATORY_CARE_PROVIDER_SITE_OTHER): Payer: Medicare Other | Admitting: Ophthalmology

## 2021-09-03 DIAGNOSIS — E113513 Type 2 diabetes mellitus with proliferative diabetic retinopathy with macular edema, bilateral: Secondary | ICD-10-CM | POA: Diagnosis not present

## 2021-09-03 DIAGNOSIS — I10 Essential (primary) hypertension: Secondary | ICD-10-CM | POA: Diagnosis not present

## 2021-09-03 DIAGNOSIS — H35033 Hypertensive retinopathy, bilateral: Secondary | ICD-10-CM | POA: Diagnosis not present

## 2021-09-09 NOTE — Telephone Encounter (Signed)
Patient was calling for his April PSA results- results given to patient.

## 2021-10-07 ENCOUNTER — Encounter (INDEPENDENT_AMBULATORY_CARE_PROVIDER_SITE_OTHER): Payer: Medicare Other | Admitting: Ophthalmology

## 2021-10-07 DIAGNOSIS — E113513 Type 2 diabetes mellitus with proliferative diabetic retinopathy with macular edema, bilateral: Secondary | ICD-10-CM

## 2021-10-07 DIAGNOSIS — H35033 Hypertensive retinopathy, bilateral: Secondary | ICD-10-CM

## 2021-10-07 DIAGNOSIS — I10 Essential (primary) hypertension: Secondary | ICD-10-CM

## 2021-11-05 ENCOUNTER — Encounter (INDEPENDENT_AMBULATORY_CARE_PROVIDER_SITE_OTHER): Payer: Medicare Other | Admitting: Ophthalmology

## 2021-11-05 DIAGNOSIS — H35033 Hypertensive retinopathy, bilateral: Secondary | ICD-10-CM | POA: Diagnosis not present

## 2021-11-05 DIAGNOSIS — E103513 Type 1 diabetes mellitus with proliferative diabetic retinopathy with macular edema, bilateral: Secondary | ICD-10-CM

## 2021-11-05 DIAGNOSIS — I10 Essential (primary) hypertension: Secondary | ICD-10-CM

## 2021-11-23 DIAGNOSIS — Z0001 Encounter for general adult medical examination with abnormal findings: Secondary | ICD-10-CM | POA: Diagnosis not present

## 2021-11-23 DIAGNOSIS — E663 Overweight: Secondary | ICD-10-CM | POA: Diagnosis not present

## 2021-11-23 DIAGNOSIS — E103513 Type 1 diabetes mellitus with proliferative diabetic retinopathy with macular edema, bilateral: Secondary | ICD-10-CM | POA: Diagnosis not present

## 2021-11-23 DIAGNOSIS — Z6826 Body mass index (BMI) 26.0-26.9, adult: Secondary | ICD-10-CM | POA: Diagnosis not present

## 2021-12-03 ENCOUNTER — Encounter (INDEPENDENT_AMBULATORY_CARE_PROVIDER_SITE_OTHER): Payer: Medicare Other | Admitting: Ophthalmology

## 2021-12-03 DIAGNOSIS — I10 Essential (primary) hypertension: Secondary | ICD-10-CM

## 2021-12-03 DIAGNOSIS — H35372 Puckering of macula, left eye: Secondary | ICD-10-CM

## 2021-12-03 DIAGNOSIS — H35033 Hypertensive retinopathy, bilateral: Secondary | ICD-10-CM

## 2021-12-03 DIAGNOSIS — E113513 Type 2 diabetes mellitus with proliferative diabetic retinopathy with macular edema, bilateral: Secondary | ICD-10-CM

## 2021-12-22 DIAGNOSIS — R972 Elevated prostate specific antigen [PSA]: Secondary | ICD-10-CM | POA: Diagnosis not present

## 2021-12-22 DIAGNOSIS — E7849 Other hyperlipidemia: Secondary | ICD-10-CM | POA: Diagnosis not present

## 2021-12-22 DIAGNOSIS — I10 Essential (primary) hypertension: Secondary | ICD-10-CM | POA: Diagnosis not present

## 2021-12-22 DIAGNOSIS — E119 Type 2 diabetes mellitus without complications: Secondary | ICD-10-CM | POA: Diagnosis not present

## 2021-12-28 DIAGNOSIS — E103513 Type 1 diabetes mellitus with proliferative diabetic retinopathy with macular edema, bilateral: Secondary | ICD-10-CM | POA: Diagnosis not present

## 2021-12-28 DIAGNOSIS — E7849 Other hyperlipidemia: Secondary | ICD-10-CM | POA: Diagnosis not present

## 2021-12-28 DIAGNOSIS — I10 Essential (primary) hypertension: Secondary | ICD-10-CM | POA: Diagnosis not present

## 2021-12-28 DIAGNOSIS — C61 Malignant neoplasm of prostate: Secondary | ICD-10-CM | POA: Diagnosis not present

## 2021-12-31 ENCOUNTER — Encounter (INDEPENDENT_AMBULATORY_CARE_PROVIDER_SITE_OTHER): Payer: Medicare Other | Admitting: Ophthalmology

## 2021-12-31 DIAGNOSIS — E113513 Type 2 diabetes mellitus with proliferative diabetic retinopathy with macular edema, bilateral: Secondary | ICD-10-CM | POA: Diagnosis not present

## 2021-12-31 DIAGNOSIS — H35033 Hypertensive retinopathy, bilateral: Secondary | ICD-10-CM | POA: Diagnosis not present

## 2021-12-31 DIAGNOSIS — I10 Essential (primary) hypertension: Secondary | ICD-10-CM | POA: Diagnosis not present

## 2022-01-18 ENCOUNTER — Other Ambulatory Visit: Payer: Medicare Other

## 2022-01-18 DIAGNOSIS — C61 Malignant neoplasm of prostate: Secondary | ICD-10-CM | POA: Diagnosis not present

## 2022-01-19 LAB — PSA: Prostate Specific Ag, Serum: 1 ng/mL (ref 0.0–4.0)

## 2022-01-25 ENCOUNTER — Ambulatory Visit (INDEPENDENT_AMBULATORY_CARE_PROVIDER_SITE_OTHER): Payer: Medicare Other | Admitting: Urology

## 2022-01-25 ENCOUNTER — Encounter: Payer: Self-pay | Admitting: Urology

## 2022-01-25 VITALS — BP 158/92 | HR 87

## 2022-01-25 DIAGNOSIS — N5201 Erectile dysfunction due to arterial insufficiency: Secondary | ICD-10-CM | POA: Diagnosis not present

## 2022-01-25 DIAGNOSIS — C61 Malignant neoplasm of prostate: Secondary | ICD-10-CM

## 2022-01-25 DIAGNOSIS — R351 Nocturia: Secondary | ICD-10-CM

## 2022-01-25 LAB — URINALYSIS, ROUTINE W REFLEX MICROSCOPIC
Bilirubin, UA: NEGATIVE
Glucose, UA: NEGATIVE
Ketones, UA: NEGATIVE
Leukocytes,UA: NEGATIVE
Nitrite, UA: NEGATIVE
Protein,UA: NEGATIVE
RBC, UA: NEGATIVE
Specific Gravity, UA: 1.015 (ref 1.005–1.030)
Urobilinogen, Ur: 0.2 mg/dL (ref 0.2–1.0)
pH, UA: 5.5 (ref 5.0–7.5)

## 2022-01-25 MED ORDER — SILDENAFIL CITRATE 50 MG PO TABS: 50.0000 mg | ORAL_TABLET | Freq: Every day | ORAL | 6 refills | Status: AC | PRN

## 2022-01-25 NOTE — Patient Instructions (Signed)

## 2022-01-25 NOTE — Progress Notes (Signed)
01/25/2022 9:05 AM   Douglas [name]. [birth year] [id number]  Referring provider: Alanson Puls [facility] [place],  [place] [address]  Followup Prostate cancer, Erectile dysfunction, and nocturia   HPI: Douglas [name] is a 71yo here for followup for prostate cancer, erectile dysfunction, and nocturia. PSA increased to 1.0. He had radiation therapy in 07/2020. IPSS 2 QOL 1 on no BPh therapy. Nocturia decreased to 0x with fluid management. He uses sildeanfil '50mg'$  prn with good results.    PMH: Past Medical History:  Diagnosis Date   COPD (chronic obstructive pulmonary disease) ([facility])    no inhalers used   DM type 1 (diabetes mellitus, type 1) ([facility])    Dyspnea    with heavy exertion   Glaucoma    Hypercholesteremia    Hypertension    Prostate cancer [facility])    Wears glasses    for reading    Surgical History: Past Surgical History:  Procedure Laterality Date   AIR/FLUID EXCHANGE Left 01/26/2021   Procedure: AIR/FLUID EXCHANGE;  Surgeon: [name], MD;  Location: [place];  Service: Ophthalmology;  Laterality: Left;   AIR/FLUID EXCHANGE Right 07/06/2021   Procedure: AIR/FLUID EXCHANGE;  Surgeon: [name], MD;  Location: [place];  Service: Ophthalmology;  Laterality: Right;   COLONOSCOPY N/A 04/05/2013   Procedure: COLONOSCOPY;  Surgeon: Danie Binder, MD;  [facility];  Service: Endoscopy;  Laterality: N/A;  8:30 AM   CYSTOSCOPY N/A 07/20/2020   Procedure: CYSTOSCOPY;  Surgeon: Cleon Gustin, MD;  Location: [facility];  Service: Urology;  Laterality: N/A;  NO SEEDS DETECTED BY DR. Alyson Ingles   INTRAOCULAR LENS REMOVAL Left 01/26/2021   Procedure: REMOVAL OF INTRAOCULAR LENS FROM THE VITREOUS;  Surgeon: [name], MD;  Location: [place];  Service: Ophthalmology;  Laterality: Left;   PARS PLANA VITRECTOMY Left 01/26/2021   Procedure: PARS PLANA VITRECTOMY WITH 25G;  Surgeon: [name], MD;  Location: [place];  Service:  Ophthalmology;  Laterality: Left;   PARS PLANA VITRECTOMY Right 07/06/2021   Procedure: TWENTY-FIVE GAUGE PARS PLANA VITRECTOMY WITH REMOVAL/SUTURE INTRAOCULAR LENS;  Surgeon: [name], MD;  Location: [place];  Service: Ophthalmology;  Laterality: Right;   PHOTOCOAGULATION WITH LASER Right 07/06/2021   Procedure: PHOTOCOAGULATION WITH LASER;  Surgeon: [name], MD;  Location: [place];  Service: Ophthalmology;  Laterality: Right;   PLACEMENT AND SUTURE OF SECONDARY INTRAOCULAR LENS Left 01/26/2021   Procedure: PLACEMENT AND SUTURE OF SECONDARY INTRAOCULAR LENS;  Surgeon: [name], MD;  Location: [place];  Service: Ophthalmology;  Laterality: Left;   PROSTATE BIOPSY     RADIOACTIVE SEED IMPLANT N/A 07/20/2020   Procedure: RADIOACTIVE SEED IMPLANT/BRACHYTHERAPY IMPLANT;  Surgeon: Cleon Gustin, MD;  Location: [facility];  Service: Urology;  Laterality: N/A;  54 SEEDS IMPLANTED   SPACE OAR INSTILLATION N/A 07/20/2020   Procedure: SPACE OAR INSTILLATION;  Surgeon: Cleon Gustin, MD;  Location: [facility];  Service: Urology;  Laterality: N/A;    Home Medications:  Allergies as of 01/25/2022   No Known Allergies      Medication List        Accurate as of January 25, 2022  9:05 AM. If you have any questions, ask your nurse or doctor.          atorvastatin 40 MG tablet Commonly known as: LIPITOR Take 40 mg by mouth in the morning.  bacitracin-polymyxin b ophthalmic ointment Commonly known as: POLYSPORIN Place 1 application. into the right eye 3 (three) times daily. apply to eye every 12 hours while awake   brimonidine 0.2 % ophthalmic solution Commonly known as: ALPHAGAN Place 1 drop into both eyes 2 (two) times daily.   brimonidine 0.2 % ophthalmic solution Commonly known as: ALPHAGAN Place 1 drop into both eyes 2 (two) times daily.   dorzolamide 2 % ophthalmic solution Commonly known as: TRUSOPT Place 1 drop into  both eyes 2 (two) times daily.   dorzolamide 2 % ophthalmic solution Commonly known as: TRUSOPT Place 1 drop into both eyes 2 (two) times daily.   enalapril 10 MG tablet Commonly known as: VASOTEC Take 10 mg by mouth in the morning.   FLORAJEN3 PO Take 1 tablet by mouth daily.   fluticasone 50 MCG/ACT nasal spray Commonly known as: FLONASE Place 2 sprays into both nostrils 2 (two) times daily as needed for allergies.   FreeStyle [employer] by Does not apply route. Will be on left arm dos   gatifloxacin 0.5 % Soln Commonly known as: ZYMAXID Place 1 drop into the left eye 4 (four) times daily.   gatifloxacin 0.5 % Soln Commonly known as: ZYMAXID Place 1 drop into the right eye 4 (four) times daily.   hydrochlorothiazide 25 MG tablet Commonly known as: HYDRODIURIL Take 25 mg by mouth in the morning.   insulin lispro 100 UNIT/ML KwikPen Commonly known as: HUMALOG Inject 5-25 Units into the skin with breakfast, with lunch, and with evening meal. Sliding scale as needed 1 to 6 carb ratio   Lantus SoloStar 100 UNIT/ML Solostar Pen Generic drug: insulin glargine Inject 16 Units into the skin at bedtime.   NON FORMULARY Inject 1 Syringe into the eye every 6 (six) weeks.   POLYMYXIN B-TRIMETHOPRIM OP Apply 1 drop to eye See admin instructions. The day of & the day after eye injection   prednisoLONE acetate 1 % ophthalmic suspension Commonly known as: PRED FORTE Place 1 drop into the left eye 4 (four) times daily.   prednisoLONE acetate 1 % ophthalmic suspension Commonly known as: PRED FORTE Place 1 drop into the right eye 4 (four) times daily.   prednisoLONE acetate 1 % ophthalmic suspension Commonly known as: PRED FORTE Place 1 drop into the right eye 4 (four) times daily.   sildenafil 50 MG tablet Commonly known as: VIAGRA Take 1 tablet (50 mg total) by mouth daily as needed for erectile dysfunction.   timolol 0.25 % ophthalmic solution Commonly known  as: TIMOPTIC Place 1 drop into both eyes 2 (two) times daily.        Allergies: No Known Allergies  Family History: Family History  Problem Relation Age of Onset   Breast cancer Mother    Colon cancer Neg Hx    Pancreatic cancer Neg Hx    Prostate cancer Neg Hx     Social History:  reports that he has been smoking cigarettes. He has a 35.00 pack-year smoking history. He has never used smokeless tobacco. He reports that he does not drink alcohol and does not use drugs.  ROS: All other review of systems were reviewed and are negative except what is noted above in HPI  Physical Exam: BP (!) 158/92   Pulse 87   Constitutional:  Alert and oriented, No acute distress. HEENT: [place] AT, moist mucus membranes.  Trachea midline, no masses. Cardiovascular: No clubbing, cyanosis, or edema. Respiratory: Normal respiratory effort, no increased  work of breathing. GI: Abdomen is soft, nontender, nondistended, no abdominal masses GU: No CVA tenderness.  Lymph: No cervical or inguinal lymphadenopathy. Skin: No rashes, bruises or suspicious lesions. Neurologic: Grossly intact, no focal deficits, moving all 4 extremities. Psychiatric: Normal mood and affect.  Laboratory Data: Lab Results  Component Value Date   WBC 7.7 07/06/2021   HGB 14.0 07/06/2021   HCT 40.9 07/06/2021   MCV 94.0 07/06/2021   PLT 246 07/06/2021    Lab Results  Component Value Date   CREATININE 0.82 07/06/2021    No results found for: "PSA"  No results found for: "TESTOSTERONE"  Lab Results  Component Value Date   HGBA1C 7.5 (H) 07/06/2021    Urinalysis    Component Value Date/Time   COLORURINE AMBER (A) 08/01/2020 0838   APPEARANCEUR Clear 07/23/2021 0916   LABSPEC 1.009 08/01/2020 0838   PHURINE 6.0 08/01/2020 0838   GLUCOSEU Negative 07/23/2021 0916   HGBUR MODERATE (A) 08/01/2020 0838   BILIRUBINUR Negative 07/23/2021 0916   KETONESUR 20 (A) 08/01/2020 0838   PROTEINUR Negative 07/23/2021 0916    PROTEINUR 100 (A) 08/01/2020 0838   NITRITE Negative 07/23/2021 0916   NITRITE NEGATIVE 08/01/2020 0838   LEUKOCYTESUR Negative 07/23/2021 0916   LEUKOCYTESUR LARGE (A) 08/01/2020 0838    Lab Results  Component Value Date   LABMICR Comment 07/23/2021   WBCUA >30 (A) 09/28/2020   LABEPIT 0-10 09/28/2020   MUCUS Present 09/28/2020   BACTERIA Many (A) 09/28/2020    Pertinent Imaging:  No results found for this or any previous visit.  No results found for this or any previous visit.  No results found for this or any previous visit.  No results found for this or any previous visit.  No results found for this or any previous visit.  No valid procedures specified. No results found for this or any previous visit.  No results found for this or any previous visit.   Assessment & Plan:    1. Prostate cancer ([place]) -RTC 6 months with PSA - Urinalysis, Routine w reflex microscopic  2. Nocturia -resolved with fluid management   3. Erectile dysfunction due to arterial insufficiency -sildenafil '50mg'$    No follow-ups on file.  [name], MD  [facility] [place]

## 2022-01-28 ENCOUNTER — Encounter (INDEPENDENT_AMBULATORY_CARE_PROVIDER_SITE_OTHER): Payer: Medicare Other | Admitting: Ophthalmology

## 2022-01-28 DIAGNOSIS — I10 Essential (primary) hypertension: Secondary | ICD-10-CM | POA: Diagnosis not present

## 2022-01-28 DIAGNOSIS — H35033 Hypertensive retinopathy, bilateral: Secondary | ICD-10-CM

## 2022-01-28 DIAGNOSIS — E113513 Type 2 diabetes mellitus with proliferative diabetic retinopathy with macular edema, bilateral: Secondary | ICD-10-CM | POA: Diagnosis not present

## 2022-02-02 DIAGNOSIS — H35033 Hypertensive retinopathy, bilateral: Secondary | ICD-10-CM | POA: Diagnosis not present

## 2022-02-02 DIAGNOSIS — E113513 Type 2 diabetes mellitus with proliferative diabetic retinopathy with macular edema, bilateral: Secondary | ICD-10-CM | POA: Diagnosis not present

## 2022-02-02 DIAGNOSIS — H40043 Steroid responder, bilateral: Secondary | ICD-10-CM | POA: Diagnosis not present

## 2022-02-02 DIAGNOSIS — H401131 Primary open-angle glaucoma, bilateral, mild stage: Secondary | ICD-10-CM | POA: Diagnosis not present

## 2022-02-03 DIAGNOSIS — H524 Presbyopia: Secondary | ICD-10-CM | POA: Diagnosis not present

## 2022-02-25 ENCOUNTER — Encounter (INDEPENDENT_AMBULATORY_CARE_PROVIDER_SITE_OTHER): Payer: Medicare Other | Admitting: Ophthalmology

## 2022-02-25 DIAGNOSIS — E113513 Type 2 diabetes mellitus with proliferative diabetic retinopathy with macular edema, bilateral: Secondary | ICD-10-CM

## 2022-02-25 DIAGNOSIS — I10 Essential (primary) hypertension: Secondary | ICD-10-CM | POA: Diagnosis not present

## 2022-02-25 DIAGNOSIS — H35033 Hypertensive retinopathy, bilateral: Secondary | ICD-10-CM

## 2022-03-09 ENCOUNTER — Ambulatory Visit (HOSPITAL_COMMUNITY): Payer: Medicare Other | Admitting: Certified Registered"

## 2022-03-09 ENCOUNTER — Encounter (HOSPITAL_COMMUNITY)
Admission: RE | Disposition: A | Discharge status: Home / Self Care | Payer: Self-pay | Source: Ambulatory Visit | Attending: Ophthalmology

## 2022-03-09 ENCOUNTER — Ambulatory Visit (HOSPITAL_COMMUNITY)
Admission: RE | Admit: 2022-03-09 | Discharge: 2022-03-09 | Disposition: A | Discharge status: Home / Self Care | Payer: Medicare Other | Source: Ambulatory Visit | Attending: Ophthalmology | Admitting: Ophthalmology

## 2022-03-09 ENCOUNTER — Encounter (INDEPENDENT_AMBULATORY_CARE_PROVIDER_SITE_OTHER): Payer: Medicare Other | Admitting: Ophthalmology

## 2022-03-09 ENCOUNTER — Ambulatory Visit (HOSPITAL_BASED_OUTPATIENT_CLINIC_OR_DEPARTMENT_OTHER): Payer: Medicare Other | Admitting: Certified Registered"

## 2022-03-09 DIAGNOSIS — Z1812 Retained nonmagnetic metal fragments: Secondary | ICD-10-CM

## 2022-03-09 DIAGNOSIS — I10 Essential (primary) hypertension: Secondary | ICD-10-CM

## 2022-03-09 DIAGNOSIS — H538 Other visual disturbances: Secondary | ICD-10-CM | POA: Insufficient documentation

## 2022-03-09 DIAGNOSIS — J449 Chronic obstructive pulmonary disease, unspecified: Secondary | ICD-10-CM

## 2022-03-09 DIAGNOSIS — H44712 Retained (nonmagnetic) (old) foreign body in anterior chamber, left eye: Secondary | ICD-10-CM

## 2022-03-09 DIAGNOSIS — F17210 Nicotine dependence, cigarettes, uncomplicated: Secondary | ICD-10-CM | POA: Insufficient documentation

## 2022-03-09 DIAGNOSIS — F17200 Nicotine dependence, unspecified, uncomplicated: Secondary | ICD-10-CM | POA: Diagnosis not present

## 2022-03-09 DIAGNOSIS — H547 Unspecified visual loss: Secondary | ICD-10-CM | POA: Diagnosis not present

## 2022-03-09 DIAGNOSIS — E113513 Type 2 diabetes mellitus with proliferative diabetic retinopathy with macular edema, bilateral: Secondary | ICD-10-CM | POA: Diagnosis not present

## 2022-03-09 DIAGNOSIS — T85628S Displacement of other specified internal prosthetic devices, implants and grafts, sequela: Secondary | ICD-10-CM

## 2022-03-09 DIAGNOSIS — H1820 Unspecified corneal edema: Secondary | ICD-10-CM | POA: Insufficient documentation

## 2022-03-09 DIAGNOSIS — Z79899 Other long term (current) drug therapy: Secondary | ICD-10-CM | POA: Diagnosis not present

## 2022-03-09 DIAGNOSIS — H18222 Idiopathic corneal edema, left eye: Secondary | ICD-10-CM

## 2022-03-09 DIAGNOSIS — T85628A Displacement of other specified internal prosthetic devices, implants and grafts, initial encounter: Secondary | ICD-10-CM | POA: Diagnosis not present

## 2022-03-09 DIAGNOSIS — E109 Type 1 diabetes mellitus without complications: Secondary | ICD-10-CM | POA: Diagnosis not present

## 2022-03-09 DIAGNOSIS — Z794 Long term (current) use of insulin: Secondary | ICD-10-CM | POA: Insufficient documentation

## 2022-03-09 DIAGNOSIS — H35033 Hypertensive retinopathy, bilateral: Secondary | ICD-10-CM

## 2022-03-09 HISTORY — PX: ANTERIOR CHAMBER WASHOUT: SHX5291

## 2022-03-09 LAB — BASIC METABOLIC PANEL
Anion gap: 10 (ref 5–15)
BUN: 13 mg/dL (ref 8–23)
CO2: 28 mmol/L (ref 22–32)
Calcium: 9.1 mg/dL (ref 8.9–10.3)
Chloride: 99 mmol/L (ref 98–111)
Creatinine, Ser: 0.8 mg/dL (ref 0.61–1.24)
GFR, Estimated: >60 mL/min (ref >60)
Glucose, Bld: 175 mg/dL — ABNORMAL HIGH (ref 70–99)
Potassium: 4.2 mmol/L (ref 3.5–5.1)
Sodium: 137 mmol/L (ref 135–145)

## 2022-03-09 LAB — CBC
HCT: 43.9 % (ref 39.0–52.0)
Hemoglobin: 16 g/dL (ref 13.0–17.0)
MCH: 33.8 pg (ref 26.0–34.0)
MCHC: 36.4 g/dL — ABNORMAL HIGH (ref 30.0–36.0)
MCV: 92.8 fL (ref 80.0–100.0)
Platelets: 282 10*3/uL (ref 150–400)
RBC: 4.73 MIL/uL (ref 4.22–5.81)
RDW: 12.5 % (ref 11.5–15.5)
WBC: 8.6 10*3/uL (ref 4.0–10.5)
nRBC: 0 % (ref 0.0–0.2)

## 2022-03-09 LAB — GLUCOSE, CAPILLARY
Glucose-Capillary: 151 mg/dL — ABNORMAL HIGH (ref 70–99)
Glucose-Capillary: 140 mg/dL — ABNORMAL HIGH (ref 70–99)
Glucose-Capillary: 118 mg/dL — ABNORMAL HIGH (ref 70–99)

## 2022-03-09 SURGERY — WASHOUT, EYE, ANTERIOR CHAMBER
Anesthesia: Monitor Anesthesia Care | Site: Eye | Laterality: Left

## 2022-03-09 MED ORDER — CEFTAZIDIME 1 G IJ SOLR
INTRAMUSCULAR | Status: AC
  Filled 2022-03-09: qty 1

## 2022-03-09 MED ORDER — ATROPINE SULFATE 1 % OP SOLN
OPHTHALMIC | Status: DC | PRN
  Administered 2022-03-09: 1 [drp] via OPHTHALMIC

## 2022-03-09 MED ORDER — POLYMYXIN B SULFATE 500000 UNITS IJ SOLR
INTRAMUSCULAR | Status: AC
  Filled 2022-03-09: qty 10

## 2022-03-09 MED ORDER — HYALURONIDASE HUMAN 150 UNIT/ML IJ SOLN
INTRAMUSCULAR | Status: AC
  Filled 2022-03-09: qty 1

## 2022-03-09 MED ORDER — STERILE WATER FOR INJECTION IJ SOLN
INTRAMUSCULAR | Status: DC | PRN
  Administered 2022-03-09: 20 mL

## 2022-03-09 MED ORDER — TRIAMCINOLONE ACETONIDE 40 MG/ML IJ SUSP
INTRAMUSCULAR | Status: AC
  Filled 2022-03-09: qty 5

## 2022-03-09 MED ORDER — MIDAZOLAM HCL 2 MG/2ML IJ SOLN
INTRAMUSCULAR | Status: DC | PRN
  Administered 2022-03-09: 1 mg via INTRAVENOUS

## 2022-03-09 MED ORDER — ORAL CARE MOUTH RINSE: 15.0000 mL | Freq: Once | OROMUCOSAL | Status: AC

## 2022-03-09 MED ORDER — STERILE WATER FOR INJECTION IJ SOLN
INTRAMUSCULAR | Status: AC
  Filled 2022-03-09: qty 20

## 2022-03-09 MED ORDER — HEMOSTATIC AGENTS (NO CHARGE) OPTIME
TOPICAL | Status: DC | PRN
  Administered 2022-03-09: 1 via TOPICAL

## 2022-03-09 MED ORDER — MIDAZOLAM HCL 2 MG/2ML IJ SOLN
INTRAMUSCULAR | Status: AC
  Filled 2022-03-09: qty 2

## 2022-03-09 MED ORDER — BACITRACIN-POLYMYXIN B 500-10000 UNIT/GM OP OINT
TOPICAL_OINTMENT | OPHTHALMIC | Status: AC
  Filled 2022-03-09: qty 3.5

## 2022-03-09 MED ORDER — FENTANYL CITRATE (PF) 100 MCG/2ML IJ SOLN: 25.0000 ug | INTRAMUSCULAR | Status: DC | PRN

## 2022-03-09 MED ORDER — BSS PLUS IO SOLN
INTRAOCULAR | Status: AC
  Filled 2022-03-09: qty 500

## 2022-03-09 MED ORDER — ATROPINE SULFATE 1 % OP SOLN
OPHTHALMIC | Status: AC
  Filled 2022-03-09: qty 5

## 2022-03-09 MED ORDER — OXYCODONE HCL 5 MG/5ML PO SOLN: 5.0000 mg | Freq: Once | ORAL | Status: DC | PRN

## 2022-03-09 MED ORDER — EPINEPHRINE PF 1 MG/ML IJ SOLN
INTRAMUSCULAR | Status: AC
  Filled 2022-03-09: qty 1

## 2022-03-09 MED ORDER — ONDANSETRON HCL 4 MG/2ML IJ SOLN: 4.0000 mg | Freq: Once | INTRAMUSCULAR | Status: DC | PRN

## 2022-03-09 MED ORDER — BSS IO SOLN
INTRAOCULAR | Status: AC
  Filled 2022-03-09: qty 15

## 2022-03-09 MED ORDER — LIDOCAINE HCL 2 % IJ SOLN
INTRAMUSCULAR | Status: DC | PRN
  Administered 2022-03-09: 8 mL via OPHTHALMIC

## 2022-03-09 MED ORDER — FENTANYL CITRATE (PF) 250 MCG/5ML IJ SOLN
INTRAMUSCULAR | Status: DC | PRN
  Administered 2022-03-09: 50 ug via INTRAVENOUS

## 2022-03-09 MED ORDER — BUPIVACAINE HCL (PF) 0.75 % IJ SOLN
INTRAMUSCULAR | Status: AC
  Filled 2022-03-09: qty 10

## 2022-03-09 MED ORDER — DEXAMETHASONE SODIUM PHOSPHATE 10 MG/ML IJ SOLN
INTRAMUSCULAR | Status: DC | PRN
  Administered 2022-03-09: 10 mg

## 2022-03-09 MED ORDER — SODIUM CHLORIDE (PF) 0.9 % IJ SOLN
INTRAMUSCULAR | Status: AC
  Filled 2022-03-09: qty 10

## 2022-03-09 MED ORDER — OXYCODONE HCL 5 MG PO TABS: 5.0000 mg | ORAL_TABLET | Freq: Once | ORAL | Status: DC | PRN

## 2022-03-09 MED ORDER — ACETAZOLAMIDE SODIUM 500 MG IJ SOLR
INTRAMUSCULAR | Status: AC
  Filled 2022-03-09: qty 500

## 2022-03-09 MED ORDER — CHLORHEXIDINE GLUCONATE 0.12 % MT SOLN
15.0000 mL | Freq: Once | OROMUCOSAL | Status: AC
  Administered 2022-03-09: 15 mL via OROMUCOSAL
  Filled 2022-03-09: qty 15

## 2022-03-09 MED ORDER — BACITRACIN-POLYMYXIN B 500-10000 UNIT/GM OP OINT
TOPICAL_OINTMENT | OPHTHALMIC | Status: DC | PRN
  Administered 2022-03-09: 1 via OPHTHALMIC
  Administered 2022-03-09: 1

## 2022-03-09 MED ORDER — SODIUM HYALURONATE 10 MG/ML IO SOLUTION
PREFILLED_SYRINGE | INTRAOCULAR | Status: AC
  Filled 2022-03-09: qty 0.85

## 2022-03-09 MED ORDER — SODIUM HYALURONATE 10 MG/ML IO SOLUTION
PREFILLED_SYRINGE | INTRAOCULAR | Status: DC | PRN
  Administered 2022-03-09: .85 mL via INTRAOCULAR

## 2022-03-09 MED ORDER — LIDOCAINE HCL 2 % IJ SOLN
INTRAMUSCULAR | Status: AC
  Filled 2022-03-09: qty 20

## 2022-03-09 MED ORDER — GATIFLOXACIN 0.5 % OP SOLN
1.0000 [drp] | OPHTHALMIC | Status: AC | PRN
  Administered 2022-03-09: 1 [drp] via OPHTHALMIC
  Filled 2022-03-09: qty 2.5

## 2022-03-09 MED ORDER — PROPOFOL 10 MG/ML IV BOLUS
INTRAVENOUS | Status: DC | PRN
  Administered 2022-03-09: 30 mg via INTRAVENOUS

## 2022-03-09 MED ORDER — DEXAMETHASONE SODIUM PHOSPHATE 10 MG/ML IJ SOLN
INTRAMUSCULAR | Status: AC
  Filled 2022-03-09: qty 1

## 2022-03-09 MED ORDER — BSS PLUS IO SOLN
INTRAOCULAR | Status: DC | PRN
  Administered 2022-03-09: 1 via INTRAOCULAR

## 2022-03-09 MED ORDER — PROPOFOL 10 MG/ML IV BOLUS
INTRAVENOUS | Status: AC
  Filled 2022-03-09: qty 20

## 2022-03-09 MED ORDER — LACTATED RINGERS IV SOLN: INTRAVENOUS | Status: DC

## 2022-03-09 MED ORDER — CEFAZOLIN SODIUM-DEXTROSE 2-4 GM/100ML-% IV SOLN
2.0000 g | INTRAVENOUS | Status: AC
  Administered 2022-03-09: 2 g via INTRAVENOUS
  Filled 2022-03-09: qty 100

## 2022-03-09 MED ORDER — LIDOCAINE 2% (20 MG/ML) 5 ML SYRINGE
INTRAMUSCULAR | Status: AC
  Filled 2022-03-09: qty 5

## 2022-03-09 MED ORDER — FENTANYL CITRATE (PF) 250 MCG/5ML IJ SOLN
INTRAMUSCULAR | Status: AC
  Filled 2022-03-09: qty 5

## 2022-03-09 MED ORDER — LIDOCAINE 2% (20 MG/ML) 5 ML SYRINGE
INTRAMUSCULAR | Status: DC | PRN
  Administered 2022-03-09: 40 mg via INTRAVENOUS

## 2022-03-09 MED ORDER — BSS IO SOLN
INTRAOCULAR | Status: DC | PRN
  Administered 2022-03-09: 15 mL via INTRAOCULAR

## 2022-03-09 SURGICAL SUPPLY — 70 items
APPLICATOR DR MATTHEWS STRL (MISCELLANEOUS) IMPLANT
BAG COUNTER SPONGE SURGICOUNT (BAG) ×1 IMPLANT
BAND WRIST GAS GREEN (MISCELLANEOUS) IMPLANT
BLADE EYE CATARACT 19 1.4 BEAV (BLADE) IMPLANT
BLADE KERATOME 2.75 (BLADE) IMPLANT
BLADE MVR KNIFE 19G (BLADE) ×1 IMPLANT
BLADE MVR KNIFE 20G (BLADE) IMPLANT
CANNULA ANT CHAM MAIN (OPHTHALMIC RELATED) IMPLANT
CANNULA ANT/CHMB 27G (MISCELLANEOUS) IMPLANT
CANNULA ANT/CHMB 27GA (MISCELLANEOUS) ×1 IMPLANT
CANNULA FLEX TIP 25G (CANNULA) IMPLANT
CANNULA VISCOUS FLUID 20G (CANNULA) IMPLANT
CORD BIPOLAR FORCEPS 12FT (ELECTRODE) IMPLANT
COTTONBALL LRG STERILE PKG (GAUZE/BANDAGES/DRESSINGS) ×3 IMPLANT
DRAPE OPHTHALMIC 77X100 STRL (CUSTOM PROCEDURE TRAY) ×1 IMPLANT
ERASER HMR WETFIELD 23G BP (MISCELLANEOUS) IMPLANT
FILTER BLUE MILLIPORE (MISCELLANEOUS) IMPLANT
FILTER STRAW FLUID ASPIR (MISCELLANEOUS) IMPLANT
FORCEPS GRIESHABER ILM 25G A (INSTRUMENTS) IMPLANT
FORCEPS GRIESHABER ILM 27G (INSTRUMENTS) IMPLANT
GAS WRIST BAND GREEN (MISCELLANEOUS)
GLOVE ECLIPSE 8.5 STRL (GLOVE) ×1 IMPLANT
GLOVE SS BIOGEL STRL SZ 6.5 (GLOVE) ×2 IMPLANT
GLOVE SS BIOGEL STRL SZ 7 (GLOVE) ×1 IMPLANT
GLOVE TRIUMPH SURG SIZE 8.5 (KITS) ×1 IMPLANT
GOWN STRL REUS W/ TWL LRG LVL3 (GOWN DISPOSABLE) ×3 IMPLANT
GOWN STRL REUS W/TWL LRG LVL3 (GOWN DISPOSABLE) ×3
HANDLE PNEUMATIC FOR CONSTEL (OPHTHALMIC) IMPLANT
KIT TURNOVER KIT B (KITS) ×1 IMPLANT
KNIFE GRIESHABER SHARP 2.5MM (MISCELLANEOUS) IMPLANT
LENS BIOM SUPER VIEW SET DISP (MISCELLANEOUS) IMPLANT
MARKER SKIN DUAL TIP RULER LAB (MISCELLANEOUS) ×1 IMPLANT
NDL 18GX1X1/2 (RX/OR ONLY) (NEEDLE) ×1 IMPLANT
NDL 25GX 5/8IN NON SAFETY (NEEDLE) ×1 IMPLANT
NDL HYPO 30X.5 LL (NEEDLE) ×1 IMPLANT
NDL PRECISIONGLIDE 27X1.5 (NEEDLE) IMPLANT
NDL RETROBULBAR 25GX1.5 (NEEDLE) IMPLANT
NEEDLE 18GX1X1/2 (RX/OR ONLY) (NEEDLE) ×1 IMPLANT
NEEDLE 25GX 5/8IN NON SAFETY (NEEDLE) ×1 IMPLANT
NEEDLE HYPO 30X.5 LL (NEEDLE) ×1 IMPLANT
NEEDLE PRECISIONGLIDE 27X1.5 (NEEDLE) IMPLANT
NEEDLE RETROBULBAR 25GX1.5 (NEEDLE) ×1 IMPLANT
NS IRRIG 1000ML POUR BTL (IV SOLUTION) ×1 IMPLANT
PACK VITRECTOMY CUSTOM (CUSTOM PROCEDURE TRAY) ×1 IMPLANT
PACK VITRECTOMY PIC MCHSVP (PACKS) ×1 IMPLANT
PAD ARMBOARD 7.5X6 YLW CONV (MISCELLANEOUS) ×2 IMPLANT
PROBE DIRECTIONAL LASER (MISCELLANEOUS) IMPLANT
PROBE LASER LIGHT 20GA (MISCELLANEOUS) ×1 IMPLANT
PROBE VITRECTOMY BEVEL 25G (MISCELLANEOUS) IMPLANT
REPL STRA BRUSH NDL (NEEDLE) IMPLANT
REPL STRA BRUSH NEEDLE (NEEDLE) IMPLANT
RESERVOIR BACK FLUSH (MISCELLANEOUS) IMPLANT
ROLLS DENTAL (MISCELLANEOUS) ×2 IMPLANT
SCRAPER DIAMOND DUST MEMBRANE (MISCELLANEOUS) IMPLANT
SET INJECTOR OIL FLUID CONSTEL (OPHTHALMIC) IMPLANT
SPONGE SURGIFOAM ABS GEL 12-7 (HEMOSTASIS) ×1 IMPLANT
STOPCOCK 4 WAY LG BORE MALE ST (IV SETS) IMPLANT
SUT CHROMIC 7 0 TG140 8 (SUTURE) IMPLANT
SUT ETHILON 10 0 CS140 6 (SUTURE) IMPLANT
SUT ETHILON 9 0 TG140 8 (SUTURE) ×1 IMPLANT
SUT POLY NON ABSORB 10-0 8 STR (SUTURE) ×1 IMPLANT
SUT SILK 4 0 RB 1 (SUTURE) IMPLANT
SYR 10ML LL (SYRINGE) IMPLANT
SYR 20ML LL LF (SYRINGE) ×1 IMPLANT
SYR 5ML LL (SYRINGE) IMPLANT
SYR BULB EAR ULCER 3OZ GRN STR (SYRINGE) ×1 IMPLANT
SYR TB 1ML LUER SLIP (SYRINGE) ×1 IMPLANT
TOWEL GREEN STERILE FF (TOWEL DISPOSABLE) ×3 IMPLANT
TUBING HIGH PRESS EXTEN 6IN (TUBING) IMPLANT
WATER STERILE IRR 1000ML POUR (IV SOLUTION) ×1 IMPLANT

## 2022-03-09 NOTE — Anesthesia Preprocedure Evaluation (Signed)
Anesthesia Evaluation  Patient identified by MRN, date of birth, ID band Patient awake    Reviewed: Allergy & Precautions, H&P , NPO status , Patient's Chart, lab work & pertinent test results  Airway Mallampati: II  TM Distance: >3 FB Neck ROM: Full    Dental no notable dental hx.    Pulmonary COPD, Current Smoker   Pulmonary exam normal breath sounds clear to auscultation       Cardiovascular hypertension, Pt. on medications Normal cardiovascular exam Rhythm:Regular Rate:Normal     Neuro/Psych negative neurological ROS  negative psych ROS   GI/Hepatic negative GI ROS, Neg liver ROS,,,  Endo/Other  diabetes, Type 1, Insulin Dependent    Renal/GU negative Renal ROS  negative genitourinary   Musculoskeletal negative musculoskeletal ROS (+)    Abdominal   Peds negative pediatric ROS (+)  Hematology negative hematology ROS (+)   Anesthesia Other Findings   Reproductive/Obstetrics negative OB ROS                             Anesthesia Physical Anesthesia Plan  ASA: 3  Anesthesia Plan: MAC   Post-op Pain Management: Minimal or no pain anticipated   Induction: Intravenous  PONV Risk Score and Plan: 1 and Propofol infusion and Treatment may vary due to age or medical condition  Airway Management Planned: Simple Face Mask  Additional Equipment:   Intra-op Plan:   Post-operative Plan:   Informed Consent: I have reviewed the patients History and Physical, chart, labs and discussed the procedure including the risks, benefits and alternatives for the proposed anesthesia with the patient or authorized representative who has indicated his/her understanding and acceptance.     Dental advisory given  Plan Discussed with: CRNA and Surgeon  Anesthesia Plan Comments:        Anesthesia Quick Evaluation

## 2022-03-09 NOTE — Brief Op Note (Signed)
Brief Operative note   Preoperative diagnosis:  Left Eye Postoperative diagnosis  Retained Ozurdex implant left eye anterior chamber  Procedures:Remove Ozurdex implant with corneal wound and anterior chamber wash out.  Left eye  Surgeon:  [name], MD...  Assistant:  Deatra Ina SA  N  Anesthesia: Monitor Anesthesia Care  Specimen: none  Estimated blood loss:  1cc  Complications: none  Patient sent to PACU in good condition  Composed by [name] MD  Dictation number: [id number]

## 2022-03-09 NOTE — Op Note (Signed)
Douglas [name], OLVEDA MEDICAL RECORD NO: [id number] ACCOUNT NO: [id number] DATE OF BIRTH: [birth year] FACILITY: [facility] LOCATION: [facility] PHYSICIAN: Chrystie Nose. Zigmund Daniel, MD  Operative Report   DATE OF PROCEDURE: 03/09/2022  ADMISSION DIAGNOSIS:  Retained Ozurdex implant in the anterior chamber, left eye.  PROCEDURE:  Removal of Ozurdex implant from anterior chamber, left eye with multiple sutures.  SURGEON:  Dr. Tempie Hoist.  ASSISTANT:  Deatra Ina.  ANESTHESIA:  Local MAC.  DESCRIPTION OF PROCEDURE:  After the usual prep and drape, a retrobulbar and van Lint block was performed with Marcaine 0.75%, 4 mL of Xylocaine 2% without Epi 4 mL and Wydase 1 mL. The block was placed by the surgeon in the retrobulbar space and the eyelid  block was done as well.  The eye was watched and pressed on until proper anesthesia was obtained.  IV sedation was performed with the nurse anesthetist and the anesthesiologist.  Eyelid speculum was placed. The cornea was found to have edema from 4  o'clock to 7 o'clock extending into the visual axis.  The Ozurdex implant was seen in one single piece superiorly in the anterior chamber.  A Lewicky chamber maintainer was placed at 4 o'clock.  A 3 layered corneal scleral wound was performed at 2  o'clock.  Multiple instruments were placed in the anterior chamber, including a 27 gauge automated ILM forceps, a 25 gauge manual forceps and 20 gauge manual forceps.  Each time the implant was grasped, it was broken into multiple pieces and was  smashed.  As the instrument with implant was withdrawn, the instrument came out and the implant stayed in the anterior chamber.  The wound was extended with keratome.  The keratome was used to guide all portions of the Ozurdex implant out of the eye  while the [facility] maintainer maintained the anterior chamber depth.  The cornea was carefully spared.  Three 10-0 sutures were placed in the wound at 2 o'clock.  Diathermy was used for  hemostasis.  The wound was tested and found to be secure with  Weck-Cel wipes.  The [facility] was removed.  This port was self-sealing.  Polymyxin and ceftazidime were rinsed around the globe for anesthesia coverage.  Atropine solution was applied.  Decadron 10 mg was injected into the lower subconjunctival space.   Closing pressure was 10 mL with a Barraquer tonometer.  Approximately, one-tenth of a mL of air was placed in the anterior chamber to assist in depth maintenance.  Polysporin ophthalmic ointment was placed as well as a patch and shield.  Complications  were none.  Duration was one hour.   MUK D: 03/09/2022 4:22:16 pm T: 03/09/2022 9:34:00 pm  JOB: [id number]/ [id number]

## 2022-03-09 NOTE — H&P (Signed)
Douglas [name]. is an 71 y.o. male.   Chief Complaint: blurred vision left eye HPI: Patient received Ozurdex 10 days ago.  Has a sutured posterior chamber IOL.  Presented today with Ozurdex medication in the anterior chamber with corneal edema and loss of vision OS  Past Medical History:  Diagnosis Date   COPD (chronic obstructive pulmonary disease) (HCC)    no inhalers used   DM type 1 (diabetes mellitus, type 1) (HCC)    Dyspnea    with heavy exertion   Glaucoma    Hypercholesteremia    Hypertension    Prostate cancer [facility])    Wears glasses    for reading    Past Surgical History:  Procedure Laterality Date   AIR/FLUID EXCHANGE Left 01/26/2021   Procedure: AIR/FLUID EXCHANGE;  Surgeon: [name], MD;  Location: [place];  Service: Ophthalmology;  Laterality: Left;   AIR/FLUID EXCHANGE Right 07/06/2021   Procedure: AIR/FLUID EXCHANGE;  Surgeon: [name], MD;  Location: [place];  Service: Ophthalmology;  Laterality: Right;   COLONOSCOPY N/A 04/05/2013   Procedure: COLONOSCOPY;  Surgeon: Danie Binder, MD;  [facility];  Service: Endoscopy;  Laterality: N/A;  8:30 AM   CYSTOSCOPY N/A 07/20/2020   Procedure: CYSTOSCOPY;  Surgeon: Cleon Gustin, MD;  Location: [facility];  Service: Urology;  Laterality: N/A;  NO SEEDS DETECTED BY DR. Alyson Ingles   INTRAOCULAR LENS REMOVAL Left 01/26/2021   Procedure: REMOVAL OF INTRAOCULAR LENS FROM THE VITREOUS;  Surgeon: [name], MD;  Location: [place];  Service: Ophthalmology;  Laterality: Left;   PARS PLANA VITRECTOMY Left 01/26/2021   Procedure: PARS PLANA VITRECTOMY WITH 25G;  Surgeon: [name], MD;  Location: [place];  Service: Ophthalmology;  Laterality: Left;   PARS PLANA VITRECTOMY Right 07/06/2021   Procedure: TWENTY-FIVE GAUGE PARS PLANA VITRECTOMY WITH REMOVAL/SUTURE INTRAOCULAR LENS;  Surgeon: [name], MD;  Location: [place];  Service: Ophthalmology;  Laterality: Right;    PHOTOCOAGULATION WITH LASER Right 07/06/2021   Procedure: PHOTOCOAGULATION WITH LASER;  Surgeon: [name], MD;  Location: [place];  Service: Ophthalmology;  Laterality: Right;   PLACEMENT AND SUTURE OF SECONDARY INTRAOCULAR LENS Left 01/26/2021   Procedure: PLACEMENT AND SUTURE OF SECONDARY INTRAOCULAR LENS;  Surgeon: [name], MD;  Location: [place];  Service: Ophthalmology;  Laterality: Left;   PROSTATE BIOPSY     RADIOACTIVE SEED IMPLANT N/A 07/20/2020   Procedure: RADIOACTIVE SEED IMPLANT/BRACHYTHERAPY IMPLANT;  Surgeon: Cleon Gustin, MD;  Location: [facility];  Service: Urology;  Laterality: N/A;  54 SEEDS IMPLANTED   SPACE OAR INSTILLATION N/A 07/20/2020   Procedure: SPACE OAR INSTILLATION;  Surgeon: Cleon Gustin, MD;  Location: [facility];  Service: Urology;  Laterality: N/A;    Family History  Problem Relation Age of Onset   Breast cancer Mother    Colon cancer Neg Hx    Pancreatic cancer Neg Hx    Prostate cancer Neg Hx    Social History:  reports that he has been smoking cigarettes. He has a 35.00 pack-year smoking history. He has never used smokeless tobacco. He reports that he does not drink alcohol and does not use drugs.  Allergies: No Known Allergies  Medications Prior to Admission  Medication Sig Dispense Refill   atorvastatin (LIPITOR) 40 MG tablet Take 40 mg by mouth in the morning.     brimonidine (ALPHAGAN) 0.2 % ophthalmic solution Place  1 drop into both eyes 2 (two) times daily. 5 mL 12   dorzolamide (TRUSOPT) 2 % ophthalmic solution Place 1 drop into both eyes 2 (two) times daily.     enalapril (VASOTEC) 10 MG tablet Take 10 mg by mouth in the morning.     hydrochlorothiazide (HYDRODIURIL) 25 MG tablet Take 25 mg by mouth in the morning.     insulin lispro (HUMALOG) 100 UNIT/ML KwikPen Inject 5-25 Units into the skin with breakfast, with lunch, and with evening meal. Sliding scale as needed 1 to 6 carb ratio      LANTUS SOLOSTAR 100 UNIT/ML Solostar Pen Inject 16 Units into the skin at bedtime.     Multiple Vitamin (MULTIVITAMIN) tablet Take 1 tablet by mouth daily.     POLYMYXIN B-TRIMETHOPRIM OP Apply 1 drop to eye See admin instructions. The day of & the day after eye injection     Probiotic Product (FLORAJEN3 PO) Take 1 tablet by mouth daily.     timolol (TIMOPTIC) 0.25 % ophthalmic solution Place 1 drop into both eyes 2 (two) times daily.     bacitracin-polymyxin b (POLYSPORIN) ophthalmic ointment Place 1 application. into the right eye 3 (three) times daily. apply to eye every 12 hours while awake 3.5 g 0   brimonidine (ALPHAGAN) 0.2 % ophthalmic solution Place 1 drop into both eyes 2 (two) times daily.     Continuous Blood Gluc Receiver (FREESTYLE LIBRE READER) DEVI by Does not apply route. Will be on left arm dos     dorzolamide (TRUSOPT) 2 % ophthalmic solution Place 1 drop into both eyes 2 (two) times daily. 10 mL 12   fluticasone (FLONASE) 50 MCG/ACT nasal spray Place 2 sprays into both nostrils 2 (two) times daily as needed for allergies.     gatifloxacin (ZYMAXID) 0.5 % SOLN Place 1 drop into the left eye 4 (four) times daily.     gatifloxacin (ZYMAXID) 0.5 % SOLN Place 1 drop into the right eye 4 (four) times daily.     NON FORMULARY Inject 1 Syringe into the eye every 6 (six) weeks.     prednisoLONE acetate (PRED FORTE) 1 % ophthalmic suspension Place 1 drop into the left eye 4 (four) times daily. 5 mL 0   prednisoLONE acetate (PRED FORTE) 1 % ophthalmic suspension Place 1 drop into the right eye 4 (four) times daily. 5 mL 0   prednisoLONE acetate (PRED FORTE) 1 % ophthalmic suspension Place 1 drop into the right eye 4 (four) times daily. 5 mL 0   sildenafil (VIAGRA) 50 MG tablet Take 1 tablet (50 mg total) by mouth daily as needed for erectile dysfunction. 10 tablet 6    Review of systems otherwise negative  Blood pressure (!) 143/76, pulse (!) 56, temperature 98.2 F (36.8 C),  temperature source Oral, resp. rate 18, height '5\' 9"'$  (1.753 m), weight 74.8 kg, SpO2 99 %.  Physical exam: Mental status: oriented x3. Eyes: See eye exam associated with this date of surgery in media tab.  Scanned in by scanning center Ears, Nose, Throat: within normal limits Neck: Within Normal limits General: within normal limits Chest: Within normal limits Breast: deferred Heart: Within normal limits Abdomen: Within normal limits GU: deferred Extremities: within normal limits Skin: within normal limits  Assessment/Plan Ozurdex implant in the anterior chamber with corneal edema left eye Plan: To [facility] for Removal of Ozurdex implant from anterior chamber left eye.    [name] 03/09/2022, 11:14 AM

## 2022-03-09 NOTE — H&P (Signed)
I examined the patient today and there is no change in the medical status 

## 2022-03-09 NOTE — Transfer of Care (Signed)
Immediate Anesthesia Transfer of Care Note  Patient: Douglas [name].  Procedure(s) Performed: ANTERIOR CHAMBER WASHOUT LEFT EYE (Left: Eye)  Patient Location: PACU  Anesthesia Type:MAC  Level of Consciousness: drowsy and patient cooperative  Airway & Oxygen Therapy: Patient Spontanous Breathing  Post-op Assessment: Report given to RN and Post -op Vital signs reviewed and stable  Post vital signs: Reviewed and stable  Last Vitals:  Vitals Value Taken Time  BP 148/86 03/09/22 1618  Temp    Pulse 52 03/09/22 1620  Resp 15 03/09/22 1620  SpO2 98 % 03/09/22 1620  Vitals shown include unvalidated device data.  Last Pain:  Vitals:   03/09/22 0933  TempSrc:   PainSc: 0-No pain         Complications: No notable events documented.

## 2022-03-10 ENCOUNTER — Encounter (HOSPITAL_COMMUNITY): Payer: Self-pay | Admitting: Ophthalmology

## 2022-03-14 NOTE — Anesthesia Postprocedure Evaluation (Signed)
Anesthesia Post Note  Patient: Douglas [name].  Procedure(s) Performed: ANTERIOR CHAMBER WASHOUT LEFT EYE (Left: Eye)     Patient location during evaluation: PACU Anesthesia Type: MAC Level of consciousness: awake and alert Pain management: pain level controlled Vital Signs Assessment: post-procedure vital signs reviewed and stable Respiratory status: spontaneous breathing, nonlabored ventilation, respiratory function stable and patient connected to nasal cannula oxygen Cardiovascular status: stable and blood pressure returned to baseline Postop Assessment: no apparent nausea or vomiting Anesthetic complications: no  No notable events documented.  Last Vitals:  Vitals:   03/09/22 1645 03/09/22 1700  BP: (!) 164/64 (!) 162/68  Pulse: (!) 57 (!) 51  Resp: 20 18  Temp:    SpO2: 99% 99%    Last Pain:  Vitals:   03/09/22 1645  TempSrc:   PainSc: 0-No pain                 Dereka Lueras S

## 2022-03-25 ENCOUNTER — Encounter (HOSPITAL_COMMUNITY): Payer: Self-pay | Admitting: Ophthalmology

## 2022-03-25 ENCOUNTER — Encounter (INDEPENDENT_AMBULATORY_CARE_PROVIDER_SITE_OTHER): Payer: Medicare Other | Admitting: Ophthalmology

## 2022-03-25 ENCOUNTER — Ambulatory Visit (HOSPITAL_BASED_OUTPATIENT_CLINIC_OR_DEPARTMENT_OTHER): Payer: Medicare Other | Admitting: Certified Registered Nurse Anesthetist

## 2022-03-25 ENCOUNTER — Other Ambulatory Visit: Payer: Self-pay

## 2022-03-25 ENCOUNTER — Ambulatory Visit (HOSPITAL_COMMUNITY)
Admission: RE | Admit: 2022-03-25 | Discharge: 2022-03-26 | Disposition: A | Discharge status: Home / Self Care | Payer: Medicare Other | Source: Ambulatory Visit | Attending: Ophthalmology | Admitting: Ophthalmology

## 2022-03-25 ENCOUNTER — Encounter (HOSPITAL_COMMUNITY)
Admission: RE | Disposition: A | Discharge status: Home / Self Care | Payer: Self-pay | Source: Ambulatory Visit | Attending: Ophthalmology

## 2022-03-25 ENCOUNTER — Ambulatory Visit (HOSPITAL_COMMUNITY): Payer: Medicare Other | Admitting: Certified Registered Nurse Anesthetist

## 2022-03-25 DIAGNOSIS — T85328A Displacement of other ocular prosthetic devices, implants and grafts, initial encounter: Secondary | ICD-10-CM

## 2022-03-25 DIAGNOSIS — T85628A Displacement of other specified internal prosthetic devices, implants and grafts, initial encounter: Secondary | ICD-10-CM

## 2022-03-25 DIAGNOSIS — H35033 Hypertensive retinopathy, bilateral: Secondary | ICD-10-CM

## 2022-03-25 DIAGNOSIS — F17210 Nicotine dependence, cigarettes, uncomplicated: Secondary | ICD-10-CM | POA: Diagnosis not present

## 2022-03-25 DIAGNOSIS — H5461 Unqualified visual loss, right eye, normal vision left eye: Secondary | ICD-10-CM | POA: Diagnosis not present

## 2022-03-25 DIAGNOSIS — E109 Type 1 diabetes mellitus without complications: Secondary | ICD-10-CM | POA: Insufficient documentation

## 2022-03-25 DIAGNOSIS — H44711 Retained (nonmagnetic) (old) foreign body in anterior chamber, right eye: Secondary | ICD-10-CM | POA: Diagnosis present

## 2022-03-25 DIAGNOSIS — Z1812 Retained nonmagnetic metal fragments: Secondary | ICD-10-CM

## 2022-03-25 DIAGNOSIS — H44712 Retained (nonmagnetic) (old) foreign body in anterior chamber, left eye: Secondary | ICD-10-CM

## 2022-03-25 DIAGNOSIS — T85628S Displacement of other specified internal prosthetic devices, implants and grafts, sequela: Secondary | ICD-10-CM

## 2022-03-25 DIAGNOSIS — I10 Essential (primary) hypertension: Secondary | ICD-10-CM

## 2022-03-25 DIAGNOSIS — J449 Chronic obstructive pulmonary disease, unspecified: Secondary | ICD-10-CM | POA: Diagnosis not present

## 2022-03-25 DIAGNOSIS — H44791 Retained (old) intraocular foreign body, nonmagnetic, in other or multiple sites, right eye: Secondary | ICD-10-CM | POA: Diagnosis not present

## 2022-03-25 DIAGNOSIS — E113513 Type 2 diabetes mellitus with proliferative diabetic retinopathy with macular edema, bilateral: Secondary | ICD-10-CM

## 2022-03-25 HISTORY — PX: ANTERIOR CHAMBER WASHOUT: SHX5291

## 2022-03-25 HISTORY — PX: IRIDECTOMY: SHX1848

## 2022-03-25 LAB — GLUCOSE, CAPILLARY
Glucose-Capillary: 65 mg/dL — ABNORMAL LOW (ref 70–99)
Glucose-Capillary: 143 mg/dL — ABNORMAL HIGH (ref 70–99)
Glucose-Capillary: 122 mg/dL — ABNORMAL HIGH (ref 70–99)
Glucose-Capillary: 140 mg/dL — ABNORMAL HIGH (ref 70–99)
Glucose-Capillary: 212 mg/dL — ABNORMAL HIGH (ref 70–99)

## 2022-03-25 SURGERY — WASHOUT, EYE, ANTERIOR CHAMBER
Anesthesia: Monitor Anesthesia Care | Site: Eye | Laterality: Right

## 2022-03-25 MED ORDER — LIDOCAINE HCL (PF) 2 % IJ SOLN
INTRAMUSCULAR | Status: AC
  Filled 2022-03-25: qty 10

## 2022-03-25 MED ORDER — BSS IO SOLN
INTRAOCULAR | Status: DC | PRN
  Administered 2022-03-25: 15 mL

## 2022-03-25 MED ORDER — PREDNISOLONE ACETATE 1 % OP SUSP
1.0000 [drp] | Freq: Three times a day (TID) | OPHTHALMIC | Status: DC
  Filled 2022-03-25: qty 5

## 2022-03-25 MED ORDER — BSS IO SOLN
INTRAOCULAR | Status: AC
  Filled 2022-03-25: qty 15

## 2022-03-25 MED ORDER — POLYMYXIN B SULFATE 500000 UNITS IJ SOLR
INTRAMUSCULAR | Status: AC
  Filled 2022-03-25: qty 10

## 2022-03-25 MED ORDER — HYDROCHLOROTHIAZIDE 25 MG PO TABS
25.0000 mg | ORAL_TABLET | Freq: Every morning | ORAL | Status: DC
  Administered 2022-03-26: 25 mg via ORAL
  Filled 2022-03-25: qty 1

## 2022-03-25 MED ORDER — ONDANSETRON HCL 4 MG/2ML IJ SOLN
INTRAMUSCULAR | Status: DC | PRN
  Administered 2022-03-25: 4 mg via INTRAVENOUS

## 2022-03-25 MED ORDER — ORAL CARE MOUTH RINSE: 15.0000 mL | Freq: Once | OROMUCOSAL | Status: AC

## 2022-03-25 MED ORDER — DORZOLAMIDE HCL 2 % OP SOLN
1.0000 [drp] | Freq: Three times a day (TID) | OPHTHALMIC | Status: DC
  Filled 2022-03-25: qty 10

## 2022-03-25 MED ORDER — DEXTROSE 50 % IV SOLN
INTRAVENOUS | Status: AC
  Filled 2022-03-25: qty 50

## 2022-03-25 MED ORDER — LIDOCAINE HCL (PF) 2 % IJ SOLN
INTRAMUSCULAR | Status: DC | PRN
  Administered 2022-03-25: 10 mL via INTRAMUSCULAR

## 2022-03-25 MED ORDER — PROPOFOL 10 MG/ML IV BOLUS
INTRAVENOUS | Status: AC
  Filled 2022-03-25: qty 20

## 2022-03-25 MED ORDER — CEFTAZIDIME 1 G IJ SOLR
INTRAMUSCULAR | Status: AC
  Filled 2022-03-25: qty 1

## 2022-03-25 MED ORDER — ACETAMINOPHEN 325 MG PO TABS: 325.0000 mg | ORAL_TABLET | ORAL | Status: DC | PRN

## 2022-03-25 MED ORDER — BRIMONIDINE TARTRATE 0.2 % OP SOLN: 1.0000 [drp] | Freq: Two times a day (BID) | OPHTHALMIC | Status: DC

## 2022-03-25 MED ORDER — CEFAZOLIN SODIUM-DEXTROSE 2-4 GM/100ML-% IV SOLN
2.0000 g | INTRAVENOUS | Status: AC
  Administered 2022-03-25: 2 g via INTRAVENOUS
  Filled 2022-03-25: qty 100

## 2022-03-25 MED ORDER — MIDAZOLAM HCL 2 MG/2ML IJ SOLN
INTRAMUSCULAR | Status: AC
  Filled 2022-03-25: qty 2

## 2022-03-25 MED ORDER — LACTATED RINGERS IV SOLN: INTRAVENOUS | Status: DC

## 2022-03-25 MED ORDER — FENTANYL CITRATE (PF) 100 MCG/2ML IJ SOLN: 25.0000 ug | INTRAMUSCULAR | Status: DC | PRN

## 2022-03-25 MED ORDER — BACITRACIN-POLYMYXIN B 500-10000 UNIT/GM OP OINT
TOPICAL_OINTMENT | OPHTHALMIC | Status: DC | PRN
  Administered 2022-03-25: 1 via OPHTHALMIC

## 2022-03-25 MED ORDER — ATORVASTATIN CALCIUM 40 MG PO TABS
40.0000 mg | ORAL_TABLET | Freq: Every morning | ORAL | Status: DC
  Administered 2022-03-26: 40 mg via ORAL
  Filled 2022-03-25: qty 1

## 2022-03-25 MED ORDER — PILOCARPINE HCL 2 % OP SOLN
1.0000 [drp] | Freq: Two times a day (BID) | OPHTHALMIC | Status: DC
  Administered 2022-03-25 (×2): 1 [drp] via OPHTHALMIC
  Filled 2022-03-25: qty 15

## 2022-03-25 MED ORDER — BSS PLUS IO SOLN
INTRAOCULAR | Status: AC
  Filled 2022-03-25: qty 500

## 2022-03-25 MED ORDER — GATIFLOXACIN 0.5 % OP SOLN
1.0000 [drp] | OPHTHALMIC | Status: AC | PRN
  Administered 2022-03-25 (×3): 1 [drp] via OPHTHALMIC
  Filled 2022-03-25: qty 2.5

## 2022-03-25 MED ORDER — BSS PLUS IO SOLN
INTRAOCULAR | Status: DC | PRN
  Administered 2022-03-25: 1 via INTRAOCULAR

## 2022-03-25 MED ORDER — LATANOPROST 0.005 % OP SOLN
1.0000 [drp] | Freq: Every day | OPHTHALMIC | Status: DC
  Filled 2022-03-25: qty 2.5

## 2022-03-25 MED ORDER — DEXTROSE 50 % IV SOLN
25.0000 mL | Freq: Once | INTRAVENOUS | Status: AC
  Administered 2022-03-25: 25 mL via INTRAVENOUS

## 2022-03-25 MED ORDER — GATIFLOXACIN 0.5 % OP SOLN
1.0000 [drp] | Freq: Four times a day (QID) | OPHTHALMIC | Status: DC
  Filled 2022-03-25: qty 2.5

## 2022-03-25 MED ORDER — BRIMONIDINE TARTRATE 0.2 % OP SOLN
1.0000 [drp] | Freq: Two times a day (BID) | OPHTHALMIC | Status: DC
  Administered 2022-03-25: 1 [drp] via OPHTHALMIC
  Filled 2022-03-25: qty 5

## 2022-03-25 MED ORDER — PREDNISOLONE ACETATE 1 % OP SUSP
1.0000 [drp] | Freq: Four times a day (QID) | OPHTHALMIC | Status: DC
  Filled 2022-03-25: qty 5

## 2022-03-25 MED ORDER — TETRACAINE HCL 0.5 % OP SOLN
2.0000 [drp] | Freq: Once | OPHTHALMIC | Status: DC
  Filled 2022-03-25: qty 4

## 2022-03-25 MED ORDER — STERILE WATER FOR IRRIGATION IR SOLN
Status: DC | PRN
  Administered 2022-03-25: 1000 mL

## 2022-03-25 MED ORDER — TEMAZEPAM 15 MG PO CAPS: 15.0000 mg | ORAL_CAPSULE | Freq: Every evening | ORAL | Status: DC | PRN

## 2022-03-25 MED ORDER — MORPHINE SULFATE (PF) 2 MG/ML IV SOLN: 1.0000 mg | INTRAVENOUS | Status: DC | PRN

## 2022-03-25 MED ORDER — DEXAMETHASONE SODIUM PHOSPHATE 10 MG/ML IJ SOLN
INTRAMUSCULAR | Status: AC
  Filled 2022-03-25: qty 1

## 2022-03-25 MED ORDER — ENALAPRIL MALEATE 2.5 MG PO TABS
10.0000 mg | ORAL_TABLET | Freq: Every morning | ORAL | Status: DC
  Administered 2022-03-26: 10 mg via ORAL
  Filled 2022-03-25: qty 4

## 2022-03-25 MED ORDER — HYDROCODONE-ACETAMINOPHEN 5-325 MG PO TABS: 1.0000 | ORAL_TABLET | ORAL | Status: DC | PRN

## 2022-03-25 MED ORDER — ATROPINE SULFATE 1 % OP SOLN
OPHTHALMIC | Status: AC
  Filled 2022-03-25: qty 5

## 2022-03-25 MED ORDER — BACITRACIN-POLYMYXIN B 500-10000 UNIT/GM OP OINT
TOPICAL_OINTMENT | OPHTHALMIC | Status: AC
  Filled 2022-03-25: qty 3.5

## 2022-03-25 MED ORDER — DEXAMETHASONE SODIUM PHOSPHATE 10 MG/ML IJ SOLN
INTRAMUSCULAR | Status: DC | PRN
  Administered 2022-03-25: 10 mg

## 2022-03-25 MED ORDER — INSULIN LISPRO (1 UNIT DIAL) 100 UNIT/ML (KWIKPEN): 5.0000 [IU] | PEN_INJECTOR | Freq: Three times a day (TID) | SUBCUTANEOUS | Status: DC

## 2022-03-25 MED ORDER — STERILE WATER FOR INJECTION IJ SOLN
INTRAMUSCULAR | Status: DC | PRN
  Administered 2022-03-25: 20 mL

## 2022-03-25 MED ORDER — SODIUM CHLORIDE 0.45 % IV SOLN: INTRAVENOUS | Status: DC

## 2022-03-25 MED ORDER — TIMOLOL MALEATE 0.25 % OP SOLN
1.0000 [drp] | Freq: Two times a day (BID) | OPHTHALMIC | Status: DC
  Filled 2022-03-25: qty 5

## 2022-03-25 MED ORDER — DORZOLAMIDE HCL 2 % OP SOLN
1.0000 [drp] | Freq: Two times a day (BID) | OPHTHALMIC | Status: DC
  Filled 2022-03-25: qty 10

## 2022-03-25 MED ORDER — TRIAMCINOLONE ACETONIDE 40 MG/ML IJ SUSP
INTRAMUSCULAR | Status: AC
  Filled 2022-03-25: qty 5

## 2022-03-25 MED ORDER — TROPICAMIDE 1 % OP SOLN
1.0000 [drp] | OPHTHALMIC | Status: DC | PRN
  Filled 2022-03-25: qty 15

## 2022-03-25 MED ORDER — BACITRACIN-POLYMYXIN B 500-10000 UNIT/GM OP OINT
TOPICAL_OINTMENT | Freq: Three times a day (TID) | OPHTHALMIC | Status: DC
  Filled 2022-03-25: qty 3.5

## 2022-03-25 MED ORDER — FENTANYL CITRATE (PF) 250 MCG/5ML IJ SOLN
INTRAMUSCULAR | Status: AC
  Filled 2022-03-25: qty 5

## 2022-03-25 MED ORDER — LIDOCAINE HCL 2 % IJ SOLN
INTRAMUSCULAR | Status: AC
  Filled 2022-03-25: qty 20

## 2022-03-25 MED ORDER — EPINEPHRINE PF 1 MG/ML IJ SOLN
INTRAMUSCULAR | Status: AC
  Filled 2022-03-25: qty 1

## 2022-03-25 MED ORDER — ONDANSETRON HCL 4 MG/2ML IJ SOLN: 4.0000 mg | Freq: Four times a day (QID) | INTRAMUSCULAR | Status: DC | PRN

## 2022-03-25 MED ORDER — SODIUM HYALURONATE 10 MG/ML IO SOLUTION
PREFILLED_SYRINGE | INTRAOCULAR | Status: DC | PRN
  Administered 2022-03-25: .55 mL via INTRAOCULAR

## 2022-03-25 MED ORDER — FENTANYL CITRATE (PF) 100 MCG/2ML IJ SOLN
INTRAMUSCULAR | Status: DC | PRN
  Administered 2022-03-25: 50 ug via INTRAVENOUS

## 2022-03-25 MED ORDER — ACETAMINOPHEN 10 MG/ML IV SOLN: 1000.0000 mg | Freq: Once | INTRAVENOUS | Status: DC | PRN

## 2022-03-25 MED ORDER — POLYMYXIN B-TRIMETHOPRIM 10000-0.1 UNIT/ML-% OP SOLN
1.0000 [drp] | Freq: Three times a day (TID) | OPHTHALMIC | Status: DC
  Administered 2022-03-25: 1 [drp] via OPHTHALMIC
  Filled 2022-03-25: qty 10

## 2022-03-25 MED ORDER — MAGNESIUM HYDROXIDE 400 MG/5ML PO SUSP: 15.0000 mL | Freq: Four times a day (QID) | ORAL | Status: DC | PRN

## 2022-03-25 MED ORDER — ACETAZOLAMIDE SODIUM 500 MG IJ SOLR
INTRAMUSCULAR | Status: AC
  Filled 2022-03-25: qty 500

## 2022-03-25 MED ORDER — SODIUM CHLORIDE (PF) 0.9 % IJ SOLN
INTRAMUSCULAR | Status: AC
  Filled 2022-03-25: qty 10

## 2022-03-25 MED ORDER — STERILE WATER FOR INJECTION IJ SOLN
INTRAMUSCULAR | Status: AC
  Filled 2022-03-25: qty 20

## 2022-03-25 MED ORDER — CHLORHEXIDINE GLUCONATE 0.12 % MT SOLN
15.0000 mL | Freq: Once | OROMUCOSAL | Status: AC
  Administered 2022-03-25: 15 mL via OROMUCOSAL
  Filled 2022-03-25: qty 15

## 2022-03-25 MED ORDER — PHENYLEPHRINE HCL 2.5 % OP SOLN
1.0000 [drp] | OPHTHALMIC | Status: DC | PRN
  Filled 2022-03-25: qty 2

## 2022-03-25 MED ORDER — INSULIN ASPART 100 UNIT/ML IJ SOLN
0.0000 [IU] | INTRAMUSCULAR | Status: DC
  Administered 2022-03-25: 5 [IU] via SUBCUTANEOUS
  Administered 2022-03-26: 8 [IU] via SUBCUTANEOUS
  Administered 2022-03-26: 15 [IU] via SUBCUTANEOUS
  Administered 2022-03-26: 2 [IU] via SUBCUTANEOUS

## 2022-03-25 MED ORDER — BUPIVACAINE HCL (PF) 0.75 % IJ SOLN
INTRAMUSCULAR | Status: AC
  Filled 2022-03-25: qty 10

## 2022-03-25 MED ORDER — SODIUM HYALURONATE 10 MG/ML IO SOLUTION
PREFILLED_SYRINGE | INTRAOCULAR | Status: AC
  Filled 2022-03-25: qty 0.85

## 2022-03-25 MED ORDER — ADULT MULTIVITAMIN W/MINERALS CH
1.0000 | ORAL_TABLET | Freq: Every morning | ORAL | Status: DC
  Administered 2022-03-26: 1 via ORAL
  Filled 2022-03-25: qty 1

## 2022-03-25 MED ORDER — MIDAZOLAM HCL 5 MG/5ML IJ SOLN
INTRAMUSCULAR | Status: DC | PRN
  Administered 2022-03-25: 1 mg via INTRAVENOUS

## 2022-03-25 MED ORDER — PROPOFOL 10 MG/ML IV BOLUS
INTRAVENOUS | Status: DC | PRN
  Administered 2022-03-25: 50 mg via INTRAVENOUS

## 2022-03-25 MED ORDER — CYCLOPENTOLATE HCL 1 % OP SOLN
1.0000 [drp] | OPHTHALMIC | Status: DC | PRN
  Filled 2022-03-25: qty 2

## 2022-03-25 MED ORDER — SILDENAFIL CITRATE 50 MG PO TABS: 50.0000 mg | ORAL_TABLET | Freq: Every day | ORAL | Status: DC | PRN

## 2022-03-25 SURGICAL SUPPLY — 49 items
BANDAGE EYE OVAL 2 1/8 X 2 5/8 (GAUZE/BANDAGES/DRESSINGS) ×2
BLADE EYE CATARACT 19 1.4 BEAV (BLADE) IMPLANT
BLADE KERATOME 2.75 (BLADE) IMPLANT
BLADE MVR KNIFE 20G (BLADE) IMPLANT
BNDG EYE OVAL 2 1/8 X 2 5/8 (GAUZE/BANDAGES/DRESSINGS) IMPLANT
CABLE BIPOLOR RESECTION CORD (MISCELLANEOUS) IMPLANT
CANNULA ANT CHAM MAIN (OPHTHALMIC RELATED) IMPLANT
CANNULA ANTERIOR CHAMBER 27GA (MISCELLANEOUS) IMPLANT
CANNULA VLV SOFT TIP 27G (OPHTHALMIC) IMPLANT
CANNULA VLV SOFT TIP 27GA (OPHTHALMIC) ×2 IMPLANT
CORD BIPOLAR FORCEPS 12FT (ELECTRODE) IMPLANT
COTTONBALL LRG STERILE PKG (GAUZE/BANDAGES/DRESSINGS) ×6 IMPLANT
DRAPE OPHTHALMIC 77X100 STRL (CUSTOM PROCEDURE TRAY) ×2 IMPLANT
FILTER BLUE MILLIPORE (MISCELLANEOUS) IMPLANT
FORCEPS GRIESHABER ILM 27G (INSTRUMENTS) IMPLANT
GLOVE ECLIPSE 8.5 STRL (GLOVE) ×2 IMPLANT
GOWN STRL REUS W/ TWL LRG LVL3 (GOWN DISPOSABLE) ×6 IMPLANT
GOWN STRL REUS W/TWL LRG LVL3 (GOWN DISPOSABLE) ×6
HANDLE PNEUMATIC FOR CONSTEL (OPHTHALMIC) IMPLANT
KIT TURNOVER KIT B (KITS) ×2 IMPLANT
MARKER SKIN DUAL TIP RULER LAB (MISCELLANEOUS) ×2 IMPLANT
NDL 18GX1X1/2 (RX/OR ONLY) (NEEDLE) ×2 IMPLANT
NDL 25GX 5/8IN NON SAFETY (NEEDLE) ×2 IMPLANT
NDL HYPO 30X.5 LL (NEEDLE) ×2 IMPLANT
NDL PRECISIONGLIDE 27X1.5 (NEEDLE) IMPLANT
NEEDLE 18GX1X1/2 (RX/OR ONLY) (NEEDLE) ×2 IMPLANT
NEEDLE 25GX 5/8IN NON SAFETY (NEEDLE) ×2 IMPLANT
NEEDLE HYPO 30X.5 LL (NEEDLE) ×2 IMPLANT
NEEDLE PRECISIONGLIDE 27X1.5 (NEEDLE) IMPLANT
NS IRRIG 1000ML POUR BTL (IV SOLUTION) ×2 IMPLANT
PACK VITRECTOMY CUSTOM (CUSTOM PROCEDURE TRAY) ×2 IMPLANT
PAD ARMBOARD 7.5X6 YLW CONV (MISCELLANEOUS) ×4 IMPLANT
PAK VITRECTOMY PIK  27GA (OPHTHALMIC) ×2
PAK VITRECTOMY PIK 27GA (OPHTHALMIC) IMPLANT
ROLLS DENTAL (MISCELLANEOUS) ×4 IMPLANT
SHIELD EYE LENSE ONLY DISP (GAUZE/BANDAGES/DRESSINGS) IMPLANT
SPEAR EYE SURG WECK-CEL (MISCELLANEOUS) IMPLANT
SPONGE SURGIFOAM ABS GEL 12-7 (HEMOSTASIS) ×2 IMPLANT
STOPCOCK 4 WAY LG BORE MALE ST (IV SETS) IMPLANT
SUT ETHILON 10 0 CS140 6 (SUTURE) IMPLANT
SYR 10ML LL (SYRINGE) IMPLANT
SYR 20ML LL LF (SYRINGE) ×2 IMPLANT
SYR 5ML LL (SYRINGE) IMPLANT
SYR BULB EAR ULCER 3OZ GRN STR (SYRINGE) ×2 IMPLANT
SYR TB 1ML LUER SLIP (SYRINGE) ×2 IMPLANT
TAPE SURG TRANSPORE 1 IN (GAUZE/BANDAGES/DRESSINGS) IMPLANT
TAPE SURGICAL TRANSPORE 1 IN (GAUZE/BANDAGES/DRESSINGS) ×2
TOWEL GREEN STERILE FF (TOWEL DISPOSABLE) ×6 IMPLANT
WATER STERILE IRR 1000ML POUR (IV SOLUTION) ×2 IMPLANT

## 2022-03-25 NOTE — Brief Op Note (Signed)
Brief Operative note   Preoperative diagnosis:  Right eye foreign body in anterior chamber Postoperative diagnosis  * No Diagnosis Codes entered *  Procedures: Removal of steroid implant from anterior chamber with washout, peripheral iridectomy right eye  Surgeon:  [name], MD...  Assistant:  Randell Patient  Anesthesia: Monitor Anesthesia Care  Specimen: none  Estimated blood loss:  1cc  Complications: none  Patient sent to PACU in good condition  Composed by [name] MD  Dictation number: [id number]

## 2022-03-25 NOTE — Anesthesia Procedure Notes (Signed)
Procedure Name: MAC Date/Time: 03/25/2022 5:03 PM  Performed by: Dorann Lodge, CRNAPre-anesthesia Checklist: Patient identified, Emergency Drugs available, Suction available and Patient being monitored Patient Re-evaluated:Patient Re-evaluated prior to induction Oxygen Delivery Method: Nasal cannula Dental Injury: Teeth and Oropharynx as per pre-operative assessment

## 2022-03-25 NOTE — Progress Notes (Signed)
At 13:19 o'clock CBG 65. Patient alert, and oriented, feeling sweaty. Patient received Dextrose 50% - 12.5 mg per protocol. CBG 143 at 13:54 o'clock. Will continue to monitor.

## 2022-03-25 NOTE — Anesthesia Preprocedure Evaluation (Addendum)
Anesthesia Evaluation  Patient identified by MRN, date of birth, ID band Patient awake    Reviewed: Allergy & Precautions, NPO status , Patient's Chart, lab work & pertinent test results  Airway Mallampati: II  TM Distance: >3 FB Neck ROM: Full    Dental no notable dental hx.    Pulmonary COPD, Current Smoker   Pulmonary exam normal        Cardiovascular hypertension, Pt. on medications  Rhythm:Regular Rate:Normal     Neuro/Psych negative neurological ROS  negative psych ROS   GI/Hepatic negative GI ROS, Neg liver ROS,,,  Endo/Other  diabetes, Type 1, Insulin Dependent    Renal/GU negative Renal ROS  negative genitourinary   Musculoskeletal negative musculoskeletal ROS (+)    Abdominal Normal abdominal exam  (+)   Peds  Hematology negative hematology ROS (+)   Anesthesia Other Findings   Reproductive/Obstetrics                             Anesthesia Physical Anesthesia Plan  ASA: 2  Anesthesia Plan: MAC   Post-op Pain Management:    Induction: Intravenous  PONV Risk Score and Plan: 1 and Ondansetron, Dexamethasone, Treatment may vary due to age or medical condition and Midazolam  Airway Management Planned:   Additional Equipment: None  Intra-op Plan:   Post-operative Plan:   Informed Consent: I have reviewed the patients History and Physical, chart, labs and discussed the procedure including the risks, benefits and alternatives for the proposed anesthesia with the patient or authorized representative who has indicated his/her understanding and acceptance.     Dental advisory given  Plan Discussed with: CRNA  Anesthesia Plan Comments: (Lab Results      Component                Value               Date                      WBC                      8.6                 03/09/2022                HGB                      16.0                03/09/2022                HCT                       43.9                03/09/2022                MCV                      92.8                03/09/2022                PLT                      282  03/09/2022            Lab Results      Component                Value               Date                      NA                       137                 03/09/2022                K                        4.2                 03/09/2022                CO2                      28                  03/09/2022                GLUCOSE                  175 (H)             03/09/2022                BUN                      13                  03/09/2022                CREATININE               0.80                03/09/2022                CALCIUM                  9.1                 03/09/2022                GFRNONAA                 >60                 03/09/2022           )       Anesthesia Quick Evaluation

## 2022-03-25 NOTE — Op Note (Signed)
[name], [name] MEDICAL RECORD NO: [id number] ACCOUNT NO: [id number] DATE OF BIRTH: 11/09/1950 FACILITY: [facility] LOCATION: [facility] PHYSICIAN: Beulah Gandy. Ashley Royalty, MD  Operative Report   DATE OF PROCEDURE: 03/25/2022  ADMISSION DIAGNOSIS:  Dislocated Ozurdex implant into the anterior chamber, right eye.  PROCEDURES: 1. Anterior chamber washout to remove dislocated Ozurdex. 2.  Peripheral iridectomy in the right eye.  SURGEON:  Alan Mulder, MD  ASSISTANT: Theodoro Doing RN  ANESTHESIA:  MAC, monitored anesthesia.  DESCRIPTION OF PROCEDURE:  The patient was positioned on the operative table, directly beneath the microscope.  A retrobulbar block was made with 5 mL Marcaine 0.75, 4 mL Xylocaine 2% and 1 mL Wydase.  This mixture was given in by the surgeon in a  retrobulbar and Darel Hong fashion to provide regional anesthesia.  Pressure was placed on the globe and regional anesthesia was obtained well.  The usual prep and drape was performed. Time-out was performed.  The attention was carried to the 2 o'clock  limbus where a 3 layered corneal scleral wound was created, approximately 2 mm in width.  Before this wound was completed into the anterior chamber, a 27 gauge trocar was placed through the cornea at 6 o'clock at the limbus.  The infusion was reduced to  5 mmHg.  The corneal scleral wound was created and the keratome was used to enter the anterior chamber in a 3 layered fashion.  The implants, the dislocated white implant, swirled in the anterior chamber and Sinskey hook was used to engage the implant.   The implant broke into multiple pieces and was burped out of the wound at 2 o'clock.  All pieces of the wound were burped out of the anterior chamber.  At this point, the anterior chamber overfilled and the iris was pushing posteriorly on the intraocular  lens.  A peripheral iridectomy was created at 12 o'clock with the 27 gauge vitreous cutter through the corneal wound.  The iris moved  back into the proper position and the intraocular lens was moved into the proper position.  The wound was closed with 3  interrupted 10-0 nylon sutures.  The wound was found to be secure.  The infusion was removed.  Wet field cautery was used around the infusion until the wound was secure.  The pressure was measured to be 10 mm with a Barraquer tonometer.  Polymyxin and  ceftazidime were rinsed around the globe for antibiotic coverage.  Decadron 10 mg was injected into the lower subconjunctival space.  No Marcaine was used at the postoperative rinse.  Polysporin ophthalmic ointment, a patch and shield were placed.  The  patient was aroused and was awakened and brought to a sitting position in the operating room. He was awake and happy without pain.  He was taken to the recovery room for a brief stay. He was in satisfactory condition.  There were no complications.  The  operative time was one hour.   MUK D: 03/25/2022 6:26:09 pm T: 03/25/2022 10:05:00 pm  JOB: [id number]/ [id number]

## 2022-03-25 NOTE — H&P (Signed)
I examined the patient today and there is no change in the medical status 

## 2022-03-25 NOTE — Anesthesia Postprocedure Evaluation (Signed)
Anesthesia Post Note  Patient: Douglas [name].  Procedure(s) Performed: RIGHT EYE ANTERIOR CHAMBER WASHOUT (Right) IRIDECTOMY RIGHT EYE (Right: Eye)     Patient location during evaluation: PACU Anesthesia Type: MAC Level of consciousness: awake and alert Pain management: pain level controlled Vital Signs Assessment: post-procedure vital signs reviewed and stable Respiratory status: spontaneous breathing, nonlabored ventilation, respiratory function stable and patient connected to nasal cannula oxygen Cardiovascular status: stable and blood pressure returned to baseline Postop Assessment: no apparent nausea or vomiting Anesthetic complications: no   No notable events documented.  Last Vitals:  Vitals:   03/25/22 1930 03/25/22 2011  BP: (!) 141/67 (!) 151/65  Pulse: 67 72  Resp: 11 18  Temp: 36.7 C 36.7 C  SpO2: 96% 97%    Last Pain:  Vitals:   03/25/22 2015  TempSrc:   PainSc: 0-No pain                 Effie Berkshire

## 2022-03-25 NOTE — Transfer of Care (Signed)
Immediate Anesthesia Transfer of Care Note  Patient: Douglas [name].  Procedure(s) Performed: RIGHT EYE ANTERIOR CHAMBER WASHOUT (Right) IRIDECTOMY RIGHT EYE (Right: Eye)  Patient Location: PACU  Anesthesia Type:MAC  Level of Consciousness: awake, alert , and oriented  Airway & Oxygen Therapy: Patient Spontanous Breathing  Post-op Assessment: Report given to RN and Post -op Vital signs reviewed and stable  Post vital signs: Reviewed and stable  Last Vitals:  Vitals Value Taken Time  BP 152/71 03/25/22 1821  Temp    Pulse 72 03/25/22 1823  Resp 15 03/25/22 1823  SpO2 95 % 03/25/22 1823  Vitals shown include unvalidated device data.  Last Pain:  Vitals:   03/25/22 1337  TempSrc: Oral  PainSc: 0-No pain         Complications: No notable events documented.

## 2022-03-25 NOTE — H&P (Signed)
Douglas Harbour. is an 71 y.o. male.   Chief Complaint:Loss of vision right eye HPI: Had Ozurdex implant placed in vitreous OD on 01-25-2022.  Noted loss of vision today. Found to have Ozurdex implant in anterior chamber right eye.  Past Medical History:  Diagnosis Date   COPD (chronic obstructive pulmonary disease) (HCC)    no inhalers used   DM type 1 (diabetes mellitus, type 1) (HCC)    Dyspnea    with heavy exertion   Glaucoma    Hypercholesteremia    Hypertension    Prostate cancer [facility])    Wears glasses    for reading    Past Surgical History:  Procedure Laterality Date   AIR/FLUID EXCHANGE Left 01/26/2021   Procedure: AIR/FLUID EXCHANGE;  Surgeon: [name], MD;  Location: [place];  Service: Ophthalmology;  Laterality: Left;   AIR/FLUID EXCHANGE Right 07/06/2021   Procedure: AIR/FLUID EXCHANGE;  Surgeon: [name], MD;  Location: [place];  Service: Ophthalmology;  Laterality: Right;   ANTERIOR CHAMBER WASHOUT Left 03/09/2022   Procedure: ANTERIOR CHAMBER WASHOUT LEFT EYE;  Surgeon: [name], MD;  Location: [place];  Service: Ophthalmology;  Laterality: Left;   COLONOSCOPY N/A 04/05/2013   Procedure: COLONOSCOPY;  Surgeon: Danie Binder, MD;  [facility];  Service: Endoscopy;  Laterality: N/A;  8:30 AM   CYSTOSCOPY N/A 07/20/2020   Procedure: CYSTOSCOPY;  Surgeon: Cleon Gustin, MD;  Location: [facility];  Service: Urology;  Laterality: N/A;  NO SEEDS DETECTED BY DR. Alyson Ingles   INTRAOCULAR LENS REMOVAL Left 01/26/2021   Procedure: REMOVAL OF INTRAOCULAR LENS FROM THE VITREOUS;  Surgeon: [name], MD;  Location: [place];  Service: Ophthalmology;  Laterality: Left;   PARS PLANA VITRECTOMY Left 01/26/2021   Procedure: PARS PLANA VITRECTOMY WITH 25G;  Surgeon: [name], MD;  Location: [place];  Service: Ophthalmology;  Laterality: Left;   PARS PLANA VITRECTOMY Right 07/06/2021   Procedure: TWENTY-FIVE GAUGE PARS PLANA  VITRECTOMY WITH REMOVAL/SUTURE INTRAOCULAR LENS;  Surgeon: [name], MD;  Location: [place];  Service: Ophthalmology;  Laterality: Right;   PHOTOCOAGULATION WITH LASER Right 07/06/2021   Procedure: PHOTOCOAGULATION WITH LASER;  Surgeon: [name], MD;  Location: [place];  Service: Ophthalmology;  Laterality: Right;   PLACEMENT AND SUTURE OF SECONDARY INTRAOCULAR LENS Left 01/26/2021   Procedure: PLACEMENT AND SUTURE OF SECONDARY INTRAOCULAR LENS;  Surgeon: [name], MD;  Location: [place];  Service: Ophthalmology;  Laterality: Left;   PROSTATE BIOPSY     RADIOACTIVE SEED IMPLANT N/A 07/20/2020   Procedure: RADIOACTIVE SEED IMPLANT/BRACHYTHERAPY IMPLANT;  Surgeon: Cleon Gustin, MD;  Location: [facility];  Service: Urology;  Laterality: N/A;  54 SEEDS IMPLANTED   SPACE OAR INSTILLATION N/A 07/20/2020   Procedure: SPACE OAR INSTILLATION;  Surgeon: Cleon Gustin, MD;  Location: [facility];  Service: Urology;  Laterality: N/A;    Family History  Problem Relation Age of Onset   Breast cancer Mother    Colon cancer Neg Hx    Pancreatic cancer Neg Hx    Prostate cancer Neg Hx    Social History:  reports that he has been smoking cigarettes. He has a 35.00 pack-year smoking history. He has never used smokeless tobacco. He reports that he does not drink alcohol and does not use drugs.  Allergies: No Known Allergies  No medications prior to admission.    Review of  systems otherwise negative  There were no vitals taken for this visit.  Physical exam: Mental status: oriented x3. Eyes: See eye exam associated with this date of surgery in media tab.  Scanned in by scanning center Ears, Nose, Throat: within normal limits Neck: Within Normal limits General: within normal limits Chest: Within normal limits Breast: deferred Heart: Within normal limits Abdomen: Within normal limits GU: deferred Extremities: within normal limits Skin:  within normal limits  Assessment/Plan Dislocation of medication implant right eye H44.711 Plan: To [facility] for Anterior chambe wash out of Ozurdex implant right eye.  [name] 03/25/2022, 12:01 PM

## 2022-03-26 ENCOUNTER — Encounter (HOSPITAL_COMMUNITY): Payer: Self-pay | Admitting: Ophthalmology

## 2022-03-26 DIAGNOSIS — E109 Type 1 diabetes mellitus without complications: Secondary | ICD-10-CM | POA: Diagnosis not present

## 2022-03-26 DIAGNOSIS — H44711 Retained (nonmagnetic) (old) foreign body in anterior chamber, right eye: Secondary | ICD-10-CM | POA: Diagnosis not present

## 2022-03-26 DIAGNOSIS — H5461 Unqualified visual loss, right eye, normal vision left eye: Secondary | ICD-10-CM | POA: Diagnosis not present

## 2022-03-26 LAB — GLUCOSE, CAPILLARY
Glucose-Capillary: 149 mg/dL — ABNORMAL HIGH (ref 70–99)
Glucose-Capillary: 289 mg/dL — ABNORMAL HIGH (ref 70–99)
Glucose-Capillary: 353 mg/dL — ABNORMAL HIGH (ref 70–99)
Glucose-Capillary: 430 mg/dL — ABNORMAL HIGH (ref 70–99)

## 2022-03-26 MED ORDER — DORZOLAMIDE HCL 2 % OP SOLN: 1.0000 [drp] | Freq: Three times a day (TID) | OPHTHALMIC | 12 refills | Status: AC

## 2022-03-26 MED ORDER — BACITRACIN-POLYMYXIN B 500-10000 UNIT/GM OP OINT: TOPICAL_OINTMENT | Freq: Three times a day (TID) | OPHTHALMIC | 0 refills | Status: AC

## 2022-03-26 MED ORDER — PREDNISOLONE ACETATE 1 % OP SUSP: 1.0000 [drp] | Freq: Four times a day (QID) | OPHTHALMIC | 0 refills | Status: AC

## 2022-03-26 MED ORDER — GATIFLOXACIN 0.5 % OP SOLN: 1.0000 [drp] | Freq: Four times a day (QID) | OPHTHALMIC | Status: AC

## 2022-03-26 NOTE — Progress Notes (Signed)
Patient transfer from [facility] and oriented,no c/o of pain,patient made comfortable in room,assessment done,will continue to monitor

## 2022-03-26 NOTE — Progress Notes (Signed)
Pt discharged home in stable condition 

## 2022-03-26 NOTE — Progress Notes (Signed)
03/26/2022, 8:22 AM  Mental Status:  Awake, Alert, Oriented  Anterior segment: Cornea  Clear    Anterior Chamber Clear deep    Lens:   Clear, IOL, No ozurdex fragments seen  Intra Ocular Pressure 13 mmHg with [place]  Vitreous: No view due to pilocarping  Retina:  Good red reflex  Impression: Excellent result  Final Diagnosis: Principal Problem:   Retained intraocular foreign body in anterior chamber of right eye   Plan: start post operative eye drops.  Discharge to home.  Give post operative instructions  Douglas [name] 03/26/2022, 8:22 AM

## 2022-03-28 LAB — HEMOGLOBIN A1C
Hgb A1c MFr Bld: 6.8 % — ABNORMAL HIGH (ref 4.8–5.6)
Mean Plasma Glucose: 148 mg/dL

## 2022-04-01 ENCOUNTER — Encounter (INDEPENDENT_AMBULATORY_CARE_PROVIDER_SITE_OTHER): Payer: Medicare Other | Admitting: Ophthalmology

## 2022-04-01 DIAGNOSIS — E113513 Type 2 diabetes mellitus with proliferative diabetic retinopathy with macular edema, bilateral: Secondary | ICD-10-CM

## 2022-04-22 ENCOUNTER — Encounter (INDEPENDENT_AMBULATORY_CARE_PROVIDER_SITE_OTHER): Payer: Medicare Other | Admitting: Ophthalmology

## 2022-04-22 DIAGNOSIS — I10 Essential (primary) hypertension: Secondary | ICD-10-CM

## 2022-04-22 DIAGNOSIS — H35033 Hypertensive retinopathy, bilateral: Secondary | ICD-10-CM

## 2022-04-22 DIAGNOSIS — E103513 Type 1 diabetes mellitus with proliferative diabetic retinopathy with macular edema, bilateral: Secondary | ICD-10-CM

## 2022-04-22 DIAGNOSIS — H43812 Vitreous degeneration, left eye: Secondary | ICD-10-CM | POA: Diagnosis not present

## 2022-05-19 ENCOUNTER — Encounter (INDEPENDENT_AMBULATORY_CARE_PROVIDER_SITE_OTHER): Payer: Medicare Other | Admitting: Ophthalmology

## 2022-05-19 DIAGNOSIS — I10 Essential (primary) hypertension: Secondary | ICD-10-CM

## 2022-05-19 DIAGNOSIS — H35033 Hypertensive retinopathy, bilateral: Secondary | ICD-10-CM | POA: Diagnosis not present

## 2022-05-19 DIAGNOSIS — E103513 Type 1 diabetes mellitus with proliferative diabetic retinopathy with macular edema, bilateral: Secondary | ICD-10-CM | POA: Diagnosis not present

## 2022-06-16 ENCOUNTER — Encounter (INDEPENDENT_AMBULATORY_CARE_PROVIDER_SITE_OTHER): Payer: Medicare Other | Admitting: Ophthalmology

## 2022-06-16 DIAGNOSIS — H35033 Hypertensive retinopathy, bilateral: Secondary | ICD-10-CM

## 2022-06-16 DIAGNOSIS — E113513 Type 2 diabetes mellitus with proliferative diabetic retinopathy with macular edema, bilateral: Secondary | ICD-10-CM

## 2022-06-16 DIAGNOSIS — I10 Essential (primary) hypertension: Secondary | ICD-10-CM | POA: Diagnosis not present

## 2022-07-15 ENCOUNTER — Encounter (INDEPENDENT_AMBULATORY_CARE_PROVIDER_SITE_OTHER): Payer: Medicare Other | Admitting: Ophthalmology

## 2022-07-15 DIAGNOSIS — I10 Essential (primary) hypertension: Secondary | ICD-10-CM | POA: Diagnosis not present

## 2022-07-15 DIAGNOSIS — H35033 Hypertensive retinopathy, bilateral: Secondary | ICD-10-CM | POA: Diagnosis not present

## 2022-07-15 DIAGNOSIS — E113513 Type 2 diabetes mellitus with proliferative diabetic retinopathy with macular edema, bilateral: Secondary | ICD-10-CM

## 2022-07-20 ENCOUNTER — Other Ambulatory Visit: Payer: Medicare Other

## 2022-07-25 ENCOUNTER — Other Ambulatory Visit: Payer: Self-pay

## 2022-07-27 ENCOUNTER — Ambulatory Visit: Payer: Medicare Other | Admitting: Urology

## 2022-07-27 DIAGNOSIS — C61 Malignant neoplasm of prostate: Secondary | ICD-10-CM

## 2022-08-12 ENCOUNTER — Encounter (INDEPENDENT_AMBULATORY_CARE_PROVIDER_SITE_OTHER): Payer: Medicare Other | Admitting: Ophthalmology

## 2022-08-12 DIAGNOSIS — H43813 Vitreous degeneration, bilateral: Secondary | ICD-10-CM | POA: Diagnosis not present

## 2022-08-12 DIAGNOSIS — H35033 Hypertensive retinopathy, bilateral: Secondary | ICD-10-CM

## 2022-08-12 DIAGNOSIS — Z794 Long term (current) use of insulin: Secondary | ICD-10-CM

## 2022-08-12 DIAGNOSIS — E103513 Type 1 diabetes mellitus with proliferative diabetic retinopathy with macular edema, bilateral: Secondary | ICD-10-CM | POA: Diagnosis not present

## 2022-08-12 DIAGNOSIS — I10 Essential (primary) hypertension: Secondary | ICD-10-CM | POA: Diagnosis not present

## 2022-08-26 ENCOUNTER — Other Ambulatory Visit: Payer: Self-pay

## 2022-08-27 LAB — PSA: Prostate Specific Ag, Serum: 2.8 ng/mL (ref 0.0–4.0)

## 2022-09-02 ENCOUNTER — Ambulatory Visit (INDEPENDENT_AMBULATORY_CARE_PROVIDER_SITE_OTHER): Payer: Medicare Other | Admitting: Urology

## 2022-09-02 VITALS — BP 151/67 | HR 84

## 2022-09-02 DIAGNOSIS — N5201 Erectile dysfunction due to arterial insufficiency: Secondary | ICD-10-CM

## 2022-09-02 DIAGNOSIS — R351 Nocturia: Secondary | ICD-10-CM

## 2022-09-02 DIAGNOSIS — C61 Malignant neoplasm of prostate: Secondary | ICD-10-CM

## 2022-09-02 LAB — URINALYSIS, ROUTINE W REFLEX MICROSCOPIC
Bilirubin, UA: NEGATIVE
Glucose, UA: NEGATIVE
Leukocytes,UA: NEGATIVE
Nitrite, UA: NEGATIVE
Protein,UA: NEGATIVE
RBC, UA: NEGATIVE
Specific Gravity, UA: 1.01 (ref 1.005–1.030)
Urobilinogen, Ur: 0.2 mg/dL (ref 0.2–1.0)
pH, UA: 6 (ref 5.0–7.5)

## 2022-09-02 NOTE — Progress Notes (Unsigned)
09/02/2022 11:48 AM   Douglas [name]. 05-27-1950 [id number]  Referring provider: Ponciano Ort [facility] [address] [place],  [place] [address]  No chief complaint on file.   HPI:  IPSS 1 QOL 0. He uses sildenafil 50mg  prn with good results  PMH: Past Medical History:  Diagnosis Date   COPD (chronic obstructive pulmonary disease) (HCC)    no inhalers used   DM type 1 (diabetes mellitus, type 1) (HCC)    Dyspnea    with heavy exertion   Glaucoma    Hypercholesteremia    Hypertension    Prostate cancer [facility])    Wears glasses    for reading    Surgical History: Past Surgical History:  Procedure Laterality Date   AIR/FLUID EXCHANGE Left 01/26/2021   Procedure: AIR/FLUID EXCHANGE;  Surgeon: Sherrie George, MD;  Location: [facility] OR;  Service: Ophthalmology;  Laterality: Left;   AIR/FLUID EXCHANGE Right 07/06/2021   Procedure: AIR/FLUID EXCHANGE;  Surgeon: Sherrie George, MD;  Location: [facility] OR;  Service: Ophthalmology;  Laterality: Right;   ANTERIOR CHAMBER WASHOUT Left 03/09/2022   Procedure: ANTERIOR CHAMBER WASHOUT LEFT EYE;  Surgeon: Sherrie George, MD;  Location: [facility] OR;  Service: Ophthalmology;  Laterality: Left;   ANTERIOR CHAMBER WASHOUT Right 03/25/2022   Procedure: RIGHT EYE ANTERIOR CHAMBER WASHOUT;  Surgeon: Sherrie George, MD;  Location: [facility] OR;  Service: Ophthalmology;  Laterality: Right;   COLONOSCOPY N/A 04/05/2013   Procedure: COLONOSCOPY;  Surgeon: West Bali, MD;  [facility];  Service: Endoscopy;  Laterality: N/A;  8:30 AM   CYSTOSCOPY N/A 07/20/2020   Procedure: CYSTOSCOPY;  Surgeon: Malen Gauze, MD;  Location: [facility];  Service: Urology;  Laterality: N/A;  NO SEEDS DETECTED BY DR. Ronne Binning   INTRAOCULAR LENS REMOVAL Left 01/26/2021   Procedure: REMOVAL OF INTRAOCULAR LENS FROM THE VITREOUS;  Surgeon: Sherrie George, MD;  Location: [facility] OR;  Service: Ophthalmology;  Laterality: Left;   IRIDECTOMY Right  03/25/2022   Procedure: IRIDECTOMY RIGHT EYE;  Surgeon: Sherrie George, MD;  Location: [facility] OR;  Service: Ophthalmology;  Laterality: Right;   PARS PLANA VITRECTOMY Left 01/26/2021   Procedure: PARS PLANA VITRECTOMY WITH 25G;  Surgeon: Sherrie George, MD;  Location: [facility] OR;  Service: Ophthalmology;  Laterality: Left;   PARS PLANA VITRECTOMY Right 07/06/2021   Procedure: TWENTY-FIVE GAUGE PARS PLANA VITRECTOMY WITH REMOVAL/SUTURE INTRAOCULAR LENS;  Surgeon: Sherrie George, MD;  Location: [facility] OR;  Service: Ophthalmology;  Laterality: Right;   PHOTOCOAGULATION WITH LASER Right 07/06/2021   Procedure: PHOTOCOAGULATION WITH LASER;  Surgeon: Sherrie George, MD;  Location: [facility] OR;  Service: Ophthalmology;  Laterality: Right;   PLACEMENT AND SUTURE OF SECONDARY INTRAOCULAR LENS Left 01/26/2021   Procedure: PLACEMENT AND SUTURE OF SECONDARY INTRAOCULAR LENS;  Surgeon: Sherrie George, MD;  Location: [facility] OR;  Service: Ophthalmology;  Laterality: Left;   PROSTATE BIOPSY     RADIOACTIVE SEED IMPLANT N/A 07/20/2020   Procedure: RADIOACTIVE SEED IMPLANT/BRACHYTHERAPY IMPLANT;  Surgeon: Malen Gauze, MD;  Location: [facility];  Service: Urology;  Laterality: N/A;  54 SEEDS IMPLANTED   SPACE OAR INSTILLATION N/A 07/20/2020   Procedure: SPACE OAR INSTILLATION;  Surgeon: Malen Gauze, MD;  Location: [facility];  Service: Urology;  Laterality: N/A;    Home Medications:  Allergies as of 09/02/2022   No Known Allergies      Medication List  Accurate as of Sep 02, 2022 11:48 AM. If you have any questions, ask your nurse or doctor.          atorvastatin 40 MG tablet Commonly known as: LIPITOR Take 40 mg by mouth in the morning.   bacitracin-polymyxin b ophthalmic ointment Commonly known as: POLYSPORIN Place into the right eye 3 (three) times daily. apply to eye every 12 hours while awake   brimonidine 0.2 % ophthalmic solution Commonly known as:  ALPHAGAN Place 1 drop into both eyes 2 (two) times daily. What changed: when to take this   dorzolamide 2 % ophthalmic solution Commonly known as: TRUSOPT Place 1 drop into both eyes 2 (two) times daily. What changed: when to take this   dorzolamide 2 % ophthalmic solution Commonly known as: TRUSOPT Place 1 drop into the right eye 3 (three) times daily. What changed: Another medication with the same name was changed. Make sure you understand how and when to take each.   enalapril 10 MG tablet Commonly known as: VASOTEC Take 10 mg by mouth in the morning.   gatifloxacin 0.5 % Soln Commonly known as: ZYMAXID Place 1 drop into the right eye 4 (four) times daily.   hydrochlorothiazide 25 MG tablet Commonly known as: HYDRODIURIL Take 25 mg by mouth in the morning.   insulin lispro 100 UNIT/ML KwikPen Commonly known as: HUMALOG Inject 5-25 Units into the skin with breakfast, with lunch, and with evening meal. Sliding scale as needed 1 to 6 carb ratio   Lantus SoloStar 100 UNIT/ML Solostar Pen Generic drug: insulin glargine Inject 16 Units into the skin at bedtime.   multivitamin tablet Take 1 tablet by mouth in the morning.   prednisoLONE acetate 1 % ophthalmic suspension Commonly known as: PRED FORTE Place 1 drop into the left eye 4 (four) times daily. What changed: when to take this   prednisoLONE acetate 1 % ophthalmic suspension Commonly known as: PRED FORTE Place 1 drop into the right eye 4 (four) times daily. What changed: Another medication with the same name was changed. Make sure you understand how and when to take each.   sildenafil 50 MG tablet Commonly known as: VIAGRA Take 1 tablet (50 mg total) by mouth daily as needed for erectile dysfunction.   timolol 0.25 % ophthalmic solution Commonly known as: TIMOPTIC Place 1 drop into both eyes 2 (two) times daily.   trimethoprim-polymyxin b ophthalmic solution Commonly known as: POLYTRIM Place 1 drop into the  left eye 3 (three) times daily.        Allergies: No Known Allergies  Family History: Family History  Problem Relation Age of Onset   Breast cancer Mother    Colon cancer Neg Hx    Pancreatic cancer Neg Hx    Prostate cancer Neg Hx     Social History:  reports that he has been smoking cigarettes. He has a 35.00 pack-year smoking history. He has never used smokeless tobacco. He reports that he does not drink alcohol and does not use drugs.  ROS: All other review of systems were reviewed and are negative except what is noted above in HPI  Physical Exam: BP (!) 151/67   Pulse 84   Constitutional:  Alert and oriented, No acute distress. HEENT: [place] AT, moist mucus membranes.  Trachea midline, no masses. Cardiovascular: No clubbing, cyanosis, or edema. Respiratory: Normal respiratory effort, no increased work of breathing. GI: Abdomen is soft, nontender, nondistended, no abdominal masses GU: No CVA tenderness.  Lymph: No  cervical or inguinal lymphadenopathy. Skin: No rashes, bruises or suspicious lesions. Neurologic: Grossly intact, no focal deficits, moving all 4 extremities. Psychiatric: Normal mood and affect.  Laboratory Data: Lab Results  Component Value Date   WBC 8.6 03/09/2022   HGB 16.0 03/09/2022   HCT 43.9 03/09/2022   MCV 92.8 03/09/2022   PLT 282 03/09/2022    Lab Results  Component Value Date   CREATININE 0.80 03/09/2022    No results found for: "PSA"  No results found for: "TESTOSTERONE"  Lab Results  Component Value Date   HGBA1C 6.8 (H) 03/25/2022    Urinalysis    Component Value Date/Time   COLORURINE AMBER (A) 08/01/2020 0838   APPEARANCEUR Clear 01/25/2022 0925   LABSPEC 1.009 08/01/2020 0838   PHURINE 6.0 08/01/2020 0838   GLUCOSEU Negative 01/25/2022 0925   HGBUR MODERATE (A) 08/01/2020 0838   BILIRUBINUR Negative 01/25/2022 0925   KETONESUR 20 (A) 08/01/2020 0838   PROTEINUR Negative 01/25/2022 0925   PROTEINUR 100 (A) 08/01/2020  0838   NITRITE Negative 01/25/2022 0925   NITRITE NEGATIVE 08/01/2020 0838   LEUKOCYTESUR Negative 01/25/2022 0925   LEUKOCYTESUR LARGE (A) 08/01/2020 0838    Lab Results  Component Value Date   LABMICR Comment 01/25/2022   WBCUA >30 (A) 09/28/2020   LABEPIT 0-10 09/28/2020   MUCUS Present 09/28/2020   BACTERIA Many (A) 09/28/2020    Pertinent Imaging: *** No results found for this or any previous visit.  No results found for this or any previous visit.  No results found for this or any previous visit.  No results found for this or any previous visit.  No results found for this or any previous visit.  No valid procedures specified. No results found for this or any previous visit.  No results found for this or any previous visit.   Assessment & Plan:    1. Prostate cancer (HCC) -PSA today. If it remains above 2.0 we will proceed with PMSA PET - Urinalysis, Routine w reflex microscopic  2. Nocturia ***  3. Erectile dysfunction due to arterial insufficiency ***   No follow-ups on file.  Wilkie Aye, MD  [facility] [place]

## 2022-09-04 LAB — PSA: Prostate Specific Ag, Serum: 3.5 ng/mL (ref 0.0–4.0)

## 2022-09-06 ENCOUNTER — Other Ambulatory Visit: Payer: Self-pay | Admitting: Urology

## 2022-09-06 ENCOUNTER — Telehealth: Payer: Self-pay

## 2022-09-06 ENCOUNTER — Encounter: Payer: Self-pay | Admitting: Urology

## 2022-09-06 DIAGNOSIS — C61 Malignant neoplasm of prostate: Secondary | ICD-10-CM

## 2022-09-06 NOTE — Telephone Encounter (Signed)
-----   Message from Malen Gauze, MD sent at 09/06/2022 10:06 AM EDT ----- PSA continjue to rise. I will order PSMA PET ----- Message ----- From: Gustavus Messing, LPN Sent: 1/61/0960   9:53 PM EDT To: Malen Gauze, MD  Please review

## 2022-09-06 NOTE — Progress Notes (Signed)
Letter sent.

## 2022-09-06 NOTE — Telephone Encounter (Signed)
Patient called with no answer. Message left to return call to office. Patient also made aware that a letter will be sent via mail.

## 2022-09-06 NOTE — Patient Instructions (Signed)

## 2022-09-07 NOTE — Telephone Encounter (Signed)
Patient return call. Patient is made aware of Dr. Ronne Binning recommendation and letter sent out. Patient voiced understanding.

## 2022-09-16 ENCOUNTER — Encounter (INDEPENDENT_AMBULATORY_CARE_PROVIDER_SITE_OTHER): Payer: Medicare Other | Admitting: Ophthalmology

## 2022-09-16 DIAGNOSIS — E103513 Type 1 diabetes mellitus with proliferative diabetic retinopathy with macular edema, bilateral: Secondary | ICD-10-CM

## 2022-09-16 DIAGNOSIS — H35033 Hypertensive retinopathy, bilateral: Secondary | ICD-10-CM | POA: Diagnosis not present

## 2022-09-16 DIAGNOSIS — I10 Essential (primary) hypertension: Secondary | ICD-10-CM

## 2022-09-30 ENCOUNTER — Encounter (INDEPENDENT_AMBULATORY_CARE_PROVIDER_SITE_OTHER): Payer: Medicare Other | Admitting: Ophthalmology

## 2022-09-30 DIAGNOSIS — I10 Essential (primary) hypertension: Secondary | ICD-10-CM | POA: Diagnosis not present

## 2022-09-30 DIAGNOSIS — H35033 Hypertensive retinopathy, bilateral: Secondary | ICD-10-CM

## 2022-09-30 DIAGNOSIS — E103513 Type 1 diabetes mellitus with proliferative diabetic retinopathy with macular edema, bilateral: Secondary | ICD-10-CM

## 2022-10-06 ENCOUNTER — Ambulatory Visit (HOSPITAL_COMMUNITY)
Admission: RE | Admit: 2022-10-06 | Discharge: 2022-10-06 | Disposition: A | Discharge status: Home / Self Care | Payer: Medicare Other | Source: Ambulatory Visit | Attending: Urology | Admitting: Urology

## 2022-10-06 DIAGNOSIS — J439 Emphysema, unspecified: Secondary | ICD-10-CM | POA: Insufficient documentation

## 2022-10-06 DIAGNOSIS — C61 Malignant neoplasm of prostate: Secondary | ICD-10-CM | POA: Diagnosis present

## 2022-10-06 DIAGNOSIS — I700 Atherosclerosis of aorta: Secondary | ICD-10-CM | POA: Insufficient documentation

## 2022-10-06 MED ORDER — PIFLIFOLASTAT F 18 (PYLARIFY) INJECTION
9.0000 | Freq: Once | INTRAVENOUS | Status: AC
  Administered 2022-10-06: 8.05 via INTRAVENOUS

## 2022-10-17 ENCOUNTER — Encounter (INDEPENDENT_AMBULATORY_CARE_PROVIDER_SITE_OTHER): Payer: Medicare Other | Admitting: Ophthalmology

## 2022-10-17 DIAGNOSIS — H35033 Hypertensive retinopathy, bilateral: Secondary | ICD-10-CM | POA: Diagnosis not present

## 2022-10-17 DIAGNOSIS — I10 Essential (primary) hypertension: Secondary | ICD-10-CM

## 2022-10-17 DIAGNOSIS — E103513 Type 1 diabetes mellitus with proliferative diabetic retinopathy with macular edema, bilateral: Secondary | ICD-10-CM

## 2022-10-17 NOTE — Progress Notes (Signed)
Letter sent.

## 2022-11-09 ENCOUNTER — Telehealth: Payer: Self-pay

## 2022-11-09 NOTE — Telephone Encounter (Signed)
Patient called needing results for Pet Scan

## 2022-11-10 NOTE — Telephone Encounter (Signed)
Please review imaging. 

## 2022-11-11 NOTE — Telephone Encounter (Signed)
Return call to patient. Patient has a question, Patient states he wants to know if the radiation causing his PSA go up and if there is anything that can be done to lower his PSA. Patient is aware a message will be sent to Dr. Ronne Binning for recommendation. Patient voiced understanding

## 2022-11-15 NOTE — Telephone Encounter (Signed)
Pt imaging states that he had thickening of bladder wall causing chronic cystitis, he was asking if this can affect his PSA and cause it to rise.

## 2022-11-16 NOTE — Telephone Encounter (Signed)
Verbal from Dr. Ronne Binning that yes, the cystitis can cause his PSA to rise.  Patient informed of MD response, nothing further needed at this time.

## 2022-11-25 ENCOUNTER — Encounter (INDEPENDENT_AMBULATORY_CARE_PROVIDER_SITE_OTHER): Payer: Medicare Other | Admitting: Ophthalmology

## 2022-11-25 DIAGNOSIS — H35033 Hypertensive retinopathy, bilateral: Secondary | ICD-10-CM | POA: Diagnosis not present

## 2022-11-25 DIAGNOSIS — I10 Essential (primary) hypertension: Secondary | ICD-10-CM | POA: Diagnosis not present

## 2022-11-25 DIAGNOSIS — E113513 Type 2 diabetes mellitus with proliferative diabetic retinopathy with macular edema, bilateral: Secondary | ICD-10-CM | POA: Diagnosis not present

## 2022-11-25 DIAGNOSIS — Z794 Long term (current) use of insulin: Secondary | ICD-10-CM | POA: Diagnosis not present

## 2022-11-25 DIAGNOSIS — H33011 Retinal detachment with single break, right eye: Secondary | ICD-10-CM

## 2022-12-01 ENCOUNTER — Encounter (INDEPENDENT_AMBULATORY_CARE_PROVIDER_SITE_OTHER): Payer: Medicare Other | Admitting: Ophthalmology

## 2022-12-01 DIAGNOSIS — E113513 Type 2 diabetes mellitus with proliferative diabetic retinopathy with macular edema, bilateral: Secondary | ICD-10-CM | POA: Diagnosis not present

## 2022-12-01 DIAGNOSIS — Z794 Long term (current) use of insulin: Secondary | ICD-10-CM

## 2022-12-22 ENCOUNTER — Encounter (INDEPENDENT_AMBULATORY_CARE_PROVIDER_SITE_OTHER): Payer: Medicare Other | Admitting: Ophthalmology

## 2022-12-22 DIAGNOSIS — H33301 Unspecified retinal break, right eye: Secondary | ICD-10-CM

## 2023-01-06 ENCOUNTER — Encounter (INDEPENDENT_AMBULATORY_CARE_PROVIDER_SITE_OTHER): Payer: Medicare Other | Admitting: Ophthalmology

## 2023-01-06 DIAGNOSIS — Z794 Long term (current) use of insulin: Secondary | ICD-10-CM | POA: Diagnosis not present

## 2023-01-06 DIAGNOSIS — H35033 Hypertensive retinopathy, bilateral: Secondary | ICD-10-CM

## 2023-01-06 DIAGNOSIS — H33301 Unspecified retinal break, right eye: Secondary | ICD-10-CM

## 2023-01-06 DIAGNOSIS — I10 Essential (primary) hypertension: Secondary | ICD-10-CM

## 2023-01-06 DIAGNOSIS — E103513 Type 1 diabetes mellitus with proliferative diabetic retinopathy with macular edema, bilateral: Secondary | ICD-10-CM | POA: Diagnosis not present

## 2023-02-10 ENCOUNTER — Encounter (INDEPENDENT_AMBULATORY_CARE_PROVIDER_SITE_OTHER): Payer: Medicare Other | Admitting: Ophthalmology

## 2023-02-10 DIAGNOSIS — H33011 Retinal detachment with single break, right eye: Secondary | ICD-10-CM

## 2023-02-10 DIAGNOSIS — E113513 Type 2 diabetes mellitus with proliferative diabetic retinopathy with macular edema, bilateral: Secondary | ICD-10-CM

## 2023-02-10 DIAGNOSIS — H35033 Hypertensive retinopathy, bilateral: Secondary | ICD-10-CM | POA: Diagnosis not present

## 2023-02-10 DIAGNOSIS — Z794 Long term (current) use of insulin: Secondary | ICD-10-CM | POA: Diagnosis not present

## 2023-02-10 DIAGNOSIS — I10 Essential (primary) hypertension: Secondary | ICD-10-CM

## 2023-03-02 ENCOUNTER — Other Ambulatory Visit: Payer: Medicare Other

## 2023-03-02 DIAGNOSIS — C61 Malignant neoplasm of prostate: Secondary | ICD-10-CM

## 2023-03-03 LAB — PSA: Prostate Specific Ag, Serum: 1 ng/mL (ref 0.0–4.0)

## 2023-03-08 ENCOUNTER — Ambulatory Visit: Payer: Medicare Other | Admitting: Urology

## 2023-03-08 VITALS — BP 132/71 | HR 78

## 2023-03-08 DIAGNOSIS — R351 Nocturia: Secondary | ICD-10-CM

## 2023-03-08 DIAGNOSIS — Z8546 Personal history of malignant neoplasm of prostate: Secondary | ICD-10-CM | POA: Diagnosis not present

## 2023-03-08 DIAGNOSIS — C61 Malignant neoplasm of prostate: Secondary | ICD-10-CM

## 2023-03-08 DIAGNOSIS — N5201 Erectile dysfunction due to arterial insufficiency: Secondary | ICD-10-CM

## 2023-03-08 LAB — URINALYSIS, ROUTINE W REFLEX MICROSCOPIC
Bilirubin, UA: NEGATIVE
Glucose, UA: NEGATIVE
Leukocytes,UA: NEGATIVE
Nitrite, UA: NEGATIVE
Protein,UA: NEGATIVE
RBC, UA: NEGATIVE
Specific Gravity, UA: 1.015 (ref 1.005–1.030)
Urobilinogen, Ur: 0.2 mg/dL (ref 0.2–1.0)
pH, UA: 6 (ref 5.0–7.5)

## 2023-03-08 NOTE — Progress Notes (Signed)
03/08/2023 1:55 PM   Douglas [name]. [birth year] [id number]  Referring provider: Ponciano Ort [facility] [address] [place],  [place] [address]  Followup prostate cancer   HPI: Douglas [name] is a 72yo here for followup for prostate cancer and erectile dysfunction. PSA decreased to 1.0 from 3.5. IPSS 7 QOL 2 on no BPH therapy. Nocturia 1x. He uses sildenafil prn for his erections which works well.    PMH: Past Medical History:  Diagnosis Date   COPD (chronic obstructive pulmonary disease) (HCC)    no inhalers used   DM type 1 (diabetes mellitus, type 1) (HCC)    Dyspnea    with heavy exertion   Glaucoma    Hypercholesteremia    Hypertension    Prostate cancer [facility])    Wears glasses    for reading    Surgical History: Past Surgical History:  Procedure Laterality Date   AIR/FLUID EXCHANGE Left 01/26/2021   Procedure: AIR/FLUID EXCHANGE;  Surgeon: Sherrie George, MD;  Location[facility] OR;  Service: Ophthalmology;  Laterality: Left;   AIR/FLUID EXCHANGE Right 07/06/2021   Procedure: AIR/FLUID EXCHANGE;  Surgeon: Sherrie George, MD;  Location: [facility] OR;  Service: Ophthalmology;  Laterality: Right;   ANTERIOR CHAMBER WASHOUT Left 03/09/2022   Procedure: ANTERIOR CHAMBER WASHOUT LEFT EYE;  Surgeon: Sherrie George, MD;  Location: [facility] OR;  Service: Ophthalmology;  Laterality: Left;   ANTERIOR CHAMBER WASHOUT Right 03/25/2022   Procedure: RIGHT EYE ANTERIOR CHAMBER WASHOUT;  Surgeon: Sherrie George, MD;  Location: [facility] OR;  Service: Ophthalmology;  Laterality: Right;   COLONOSCOPY N/A 04/05/2013   Procedure: COLONOSCOPY;  Surgeon: West Bali, MD;  [facility];  Service: Endoscopy;  Laterality: N/A;  8:30 AM   CYSTOSCOPY N/A 07/20/2020   Procedure: CYSTOSCOPY;  Surgeon: Malen Gauze, MD;  Location: [facility];  Service: Urology;  Laterality: N/A;  NO SEEDS DETECTED BY DR. Ronne Binning   INTRAOCULAR LENS REMOVAL Left 01/26/2021   Procedure: REMOVAL OF  INTRAOCULAR LENS FROM THE VITREOUS;  Surgeon: Sherrie George, MD;  Location: [facility] OR;  Service: Ophthalmology;  Laterality: Left;   IRIDECTOMY Right 03/25/2022   Procedure: IRIDECTOMY RIGHT EYE;  Surgeon: Sherrie George, MD;  Location: [facility] OR;  Service: Ophthalmology;  Laterality: Right;   PARS PLANA VITRECTOMY Left 01/26/2021   Procedure: PARS PLANA VITRECTOMY WITH 25G;  Surgeon: Sherrie George, MD;  Location: [facility] OR;  Service: Ophthalmology;  Laterality: Left;   PARS PLANA VITRECTOMY Right 07/06/2021   Procedure: TWENTY-FIVE GAUGE PARS PLANA VITRECTOMY WITH REMOVAL/SUTURE INTRAOCULAR LENS;  Surgeon: Sherrie George, MD;  Location[facility] OR;  Service: Ophthalmology;  Laterality: Right;   PHOTOCOAGULATION WITH LASER Right 07/06/2021   Procedure: PHOTOCOAGULATION WITH LASER;  Surgeon: Sherrie George, MD;  Location: [facility] OR;  Service: Ophthalmology;  Laterality: Right;   PLACEMENT AND SUTURE OF SECONDARY INTRAOCULAR LENS Left 01/26/2021   Procedure: PLACEMENT AND SUTURE OF SECONDARY INTRAOCULAR LENS;  Surgeon: Sherrie George, MD;  Location: [facility] OR;  Service: Ophthalmology;  Laterality: Left;   PROSTATE BIOPSY     RADIOACTIVE SEED IMPLANT N/A 07/20/2020   Procedure: RADIOACTIVE SEED IMPLANT/BRACHYTHERAPY IMPLANT;  Surgeon: Malen Gauze, MD;  Location: [facility];  Service: Urology;  Laterality: N/A;  54 SEEDS IMPLANTED   SPACE OAR INSTILLATION N/A 07/20/2020   Procedure: SPACE OAR INSTILLATION;  Surgeon: Malen Gauze, MD;  Location: [facility];  Service: Urology;  Laterality: N/A;    Home Medications:  Allergies as of 03/08/2023   No Known Allergies      Medication List        Accurate as of March 08, 2023  1:55 PM. If you have any questions, ask your nurse or doctor.          atorvastatin 40 MG tablet Commonly known as: LIPITOR Take 40 mg by mouth in the morning.   bacitracin-polymyxin b ophthalmic ointment Commonly known as:  POLYSPORIN Place into the right eye 3 (three) times daily. apply to eye every 12 hours while awake   brimonidine 0.2 % ophthalmic solution Commonly known as: ALPHAGAN Place 1 drop into both eyes 2 (two) times daily. What changed: when to take this   dorzolamide 2 % ophthalmic solution Commonly known as: TRUSOPT Place 1 drop into both eyes 2 (two) times daily. What changed: when to take this   dorzolamide 2 % ophthalmic solution Commonly known as: TRUSOPT Place 1 drop into the right eye 3 (three) times daily. What changed: Another medication with the same name was changed. Make sure you understand how and when to take each.   enalapril 10 MG tablet Commonly known as: VASOTEC Take 10 mg by mouth in the morning.   gatifloxacin 0.5 % Soln Commonly known as: ZYMAXID Place 1 drop into the right eye 4 (four) times daily.   hydrochlorothiazide 25 MG tablet Commonly known as: HYDRODIURIL Take 25 mg by mouth in the morning.   insulin lispro 100 UNIT/ML KwikPen Commonly known as: HUMALOG Inject 5-25 Units into the skin with breakfast, with lunch, and with evening meal. Sliding scale as needed 1 to 6 carb ratio   Lantus SoloStar 100 UNIT/ML Solostar Pen Generic drug: insulin glargine Inject 16 Units into the skin at bedtime.   multivitamin tablet Take 1 tablet by mouth in the morning.   prednisoLONE acetate 1 % ophthalmic suspension Commonly known as: PRED FORTE Place 1 drop into the left eye 4 (four) times daily. What changed: when to take this   prednisoLONE acetate 1 % ophthalmic suspension Commonly known as: PRED FORTE Place 1 drop into the right eye 4 (four) times daily. What changed: Another medication with the same name was changed. Make sure you understand how and when to take each.   sildenafil 50 MG tablet Commonly known as: VIAGRA Take 1 tablet (50 mg total) by mouth daily as needed for erectile dysfunction.   timolol 0.25 % ophthalmic solution Commonly known  as: TIMOPTIC Place 1 drop into both eyes 2 (two) times daily.   trimethoprim-polymyxin b ophthalmic solution Commonly known as: POLYTRIM Place 1 drop into the left eye 3 (three) times daily.        Allergies: No Known Allergies  Family History: Family History  Problem Relation Age of Onset   Breast cancer Mother    Colon cancer Neg Hx    Pancreatic cancer Neg Hx    Prostate cancer Neg Hx     Social History:  reports that he has been smoking cigarettes. He has a 35 pack-year smoking history. He has never used smokeless tobacco. He reports that he does not drink alcohol and does not use drugs.  ROS: All other review of systems were reviewed and are negative except what is noted above in HPI  Physical Exam: BP 132/71   Pulse 78   Constitutional:  Alert and oriented, No acute distress. HEENT: [place] AT, moist mucus membranes.  Trachea midline, no masses.  Cardiovascular: No clubbing, cyanosis, or edema. Respiratory: Normal respiratory effort, no increased work of breathing. GI: Abdomen is soft, nontender, nondistended, no abdominal masses GU: No CVA tenderness.  Lymph: No cervical or inguinal lymphadenopathy. Skin: No rashes, bruises or suspicious lesions. Neurologic: Grossly intact, no focal deficits, moving all 4 extremities. Psychiatric: Normal mood and affect.  Laboratory Data: Lab Results  Component Value Date   WBC 8.6 03/09/2022   HGB 16.0 03/09/2022   HCT 43.9 03/09/2022   MCV 92.8 03/09/2022   PLT 282 03/09/2022    Lab Results  Component Value Date   CREATININE 0.80 03/09/2022    No results found for: "PSA"  No results found for: "TESTOSTERONE"  Lab Results  Component Value Date   HGBA1C 6.8 (H) 03/25/2022    Urinalysis    Component Value Date/Time   COLORURINE AMBER (A) 08/01/2020 0838   APPEARANCEUR Clear 09/02/2022 1137   LABSPEC 1.009 08/01/2020 0838   PHURINE 6.0 08/01/2020 0838   GLUCOSEU Negative 09/02/2022 1137   HGBUR MODERATE (A)  08/01/2020 0838   BILIRUBINUR Negative 09/02/2022 1137   KETONESUR 20 (A) 08/01/2020 0838   PROTEINUR Negative 09/02/2022 1137   PROTEINUR 100 (A) 08/01/2020 0838   NITRITE Negative 09/02/2022 1137   NITRITE NEGATIVE 08/01/2020 0838   LEUKOCYTESUR Negative 09/02/2022 1137   LEUKOCYTESUR LARGE (A) 08/01/2020 0838    Lab Results  Component Value Date   LABMICR Comment 09/02/2022   WBCUA >30 (A) 09/28/2020   LABEPIT 0-10 09/28/2020   MUCUS Present 09/28/2020   BACTERIA Many (A) 09/28/2020    Pertinent Imaging:  No results found for this or any previous visit.  No results found for this or any previous visit.  No results found for this or any previous visit.  No results found for this or any previous visit.  No results found for this or any previous visit.  No valid procedures specified. No results found for this or any previous visit.  No results found for this or any previous visit.   Assessment & Plan:    1. Prostate cancer (HCC) -followup 6 months with a PSA - Urinalysis, Routine w reflex microscopic  2. Nocturia -resolved  3. Erectile dysfunction due to arterial insufficiency -sildenafil prn   No follow-ups on file.  Wilkie Aye, MD  [facility] [place]

## 2023-03-10 ENCOUNTER — Encounter (INDEPENDENT_AMBULATORY_CARE_PROVIDER_SITE_OTHER): Payer: Medicare Other | Admitting: Ophthalmology

## 2023-03-10 DIAGNOSIS — Z794 Long term (current) use of insulin: Secondary | ICD-10-CM | POA: Diagnosis not present

## 2023-03-10 DIAGNOSIS — H35033 Hypertensive retinopathy, bilateral: Secondary | ICD-10-CM

## 2023-03-10 DIAGNOSIS — E113513 Type 2 diabetes mellitus with proliferative diabetic retinopathy with macular edema, bilateral: Secondary | ICD-10-CM | POA: Diagnosis not present

## 2023-03-10 DIAGNOSIS — I10 Essential (primary) hypertension: Secondary | ICD-10-CM | POA: Diagnosis not present

## 2023-03-10 DIAGNOSIS — H33011 Retinal detachment with single break, right eye: Secondary | ICD-10-CM

## 2023-03-14 ENCOUNTER — Encounter: Payer: Self-pay | Admitting: Urology

## 2023-03-14 NOTE — Patient Instructions (Signed)

## 2023-04-07 ENCOUNTER — Encounter (INDEPENDENT_AMBULATORY_CARE_PROVIDER_SITE_OTHER): Payer: Medicare Other | Admitting: Ophthalmology

## 2023-04-07 DIAGNOSIS — E103513 Type 1 diabetes mellitus with proliferative diabetic retinopathy with macular edema, bilateral: Secondary | ICD-10-CM

## 2023-04-07 DIAGNOSIS — H35033 Hypertensive retinopathy, bilateral: Secondary | ICD-10-CM

## 2023-04-07 DIAGNOSIS — I10 Essential (primary) hypertension: Secondary | ICD-10-CM

## 2023-04-07 DIAGNOSIS — H43812 Vitreous degeneration, left eye: Secondary | ICD-10-CM

## 2023-04-07 DIAGNOSIS — Z794 Long term (current) use of insulin: Secondary | ICD-10-CM

## 2023-05-05 ENCOUNTER — Encounter (INDEPENDENT_AMBULATORY_CARE_PROVIDER_SITE_OTHER): Payer: Medicare Other | Admitting: Ophthalmology

## 2023-05-05 DIAGNOSIS — I10 Essential (primary) hypertension: Secondary | ICD-10-CM | POA: Diagnosis not present

## 2023-05-05 DIAGNOSIS — Z794 Long term (current) use of insulin: Secondary | ICD-10-CM | POA: Diagnosis not present

## 2023-05-05 DIAGNOSIS — E113513 Type 2 diabetes mellitus with proliferative diabetic retinopathy with macular edema, bilateral: Secondary | ICD-10-CM

## 2023-05-05 DIAGNOSIS — H35033 Hypertensive retinopathy, bilateral: Secondary | ICD-10-CM

## 2023-05-05 DIAGNOSIS — H33011 Retinal detachment with single break, right eye: Secondary | ICD-10-CM

## 2023-06-05 ENCOUNTER — Encounter (INDEPENDENT_AMBULATORY_CARE_PROVIDER_SITE_OTHER): Payer: Medicare Other | Admitting: Ophthalmology

## 2023-06-05 DIAGNOSIS — I10 Essential (primary) hypertension: Secondary | ICD-10-CM | POA: Diagnosis not present

## 2023-06-05 DIAGNOSIS — E103513 Type 1 diabetes mellitus with proliferative diabetic retinopathy with macular edema, bilateral: Secondary | ICD-10-CM

## 2023-06-05 DIAGNOSIS — H35033 Hypertensive retinopathy, bilateral: Secondary | ICD-10-CM | POA: Diagnosis not present

## 2023-06-05 DIAGNOSIS — Z794 Long term (current) use of insulin: Secondary | ICD-10-CM

## 2023-06-05 DIAGNOSIS — H33301 Unspecified retinal break, right eye: Secondary | ICD-10-CM

## 2023-07-03 ENCOUNTER — Encounter (INDEPENDENT_AMBULATORY_CARE_PROVIDER_SITE_OTHER): Payer: Medicare Other | Admitting: Ophthalmology

## 2023-07-03 DIAGNOSIS — H35033 Hypertensive retinopathy, bilateral: Secondary | ICD-10-CM

## 2023-07-03 DIAGNOSIS — Z794 Long term (current) use of insulin: Secondary | ICD-10-CM | POA: Diagnosis not present

## 2023-07-03 DIAGNOSIS — I10 Essential (primary) hypertension: Secondary | ICD-10-CM

## 2023-07-03 DIAGNOSIS — E113513 Type 2 diabetes mellitus with proliferative diabetic retinopathy with macular edema, bilateral: Secondary | ICD-10-CM

## 2023-08-07 ENCOUNTER — Encounter (INDEPENDENT_AMBULATORY_CARE_PROVIDER_SITE_OTHER): Admitting: Ophthalmology

## 2023-08-08 ENCOUNTER — Encounter (INDEPENDENT_AMBULATORY_CARE_PROVIDER_SITE_OTHER): Admitting: Ophthalmology

## 2023-08-08 DIAGNOSIS — E103513 Type 1 diabetes mellitus with proliferative diabetic retinopathy with macular edema, bilateral: Secondary | ICD-10-CM | POA: Diagnosis not present

## 2023-08-08 DIAGNOSIS — I10 Essential (primary) hypertension: Secondary | ICD-10-CM | POA: Diagnosis not present

## 2023-08-08 DIAGNOSIS — H35033 Hypertensive retinopathy, bilateral: Secondary | ICD-10-CM | POA: Diagnosis not present

## 2023-08-08 DIAGNOSIS — H33301 Unspecified retinal break, right eye: Secondary | ICD-10-CM

## 2023-08-08 DIAGNOSIS — Z794 Long term (current) use of insulin: Secondary | ICD-10-CM | POA: Diagnosis not present

## 2023-08-29 ENCOUNTER — Other Ambulatory Visit: Payer: Medicare Other

## 2023-08-31 ENCOUNTER — Other Ambulatory Visit: Payer: Self-pay

## 2023-08-31 DIAGNOSIS — C61 Malignant neoplasm of prostate: Secondary | ICD-10-CM

## 2023-09-01 LAB — PSA: Prostate Specific Ag, Serum: 0.9 ng/mL (ref 0.0–4.0)

## 2023-09-05 ENCOUNTER — Ambulatory Visit: Payer: Self-pay

## 2023-09-06 ENCOUNTER — Ambulatory Visit: Payer: Medicare Other | Admitting: Urology

## 2023-09-12 ENCOUNTER — Encounter (INDEPENDENT_AMBULATORY_CARE_PROVIDER_SITE_OTHER): Admitting: Ophthalmology

## 2023-09-12 DIAGNOSIS — E113513 Type 2 diabetes mellitus with proliferative diabetic retinopathy with macular edema, bilateral: Secondary | ICD-10-CM | POA: Diagnosis not present

## 2023-09-12 DIAGNOSIS — Z794 Long term (current) use of insulin: Secondary | ICD-10-CM | POA: Diagnosis not present

## 2023-09-12 DIAGNOSIS — H35033 Hypertensive retinopathy, bilateral: Secondary | ICD-10-CM | POA: Diagnosis not present

## 2023-09-12 DIAGNOSIS — H33011 Retinal detachment with single break, right eye: Secondary | ICD-10-CM

## 2023-09-12 DIAGNOSIS — I10 Essential (primary) hypertension: Secondary | ICD-10-CM | POA: Diagnosis not present

## 2023-10-16 ENCOUNTER — Encounter (INDEPENDENT_AMBULATORY_CARE_PROVIDER_SITE_OTHER): Admitting: Ophthalmology

## 2023-10-16 DIAGNOSIS — I10 Essential (primary) hypertension: Secondary | ICD-10-CM

## 2023-10-16 DIAGNOSIS — E113513 Type 2 diabetes mellitus with proliferative diabetic retinopathy with macular edema, bilateral: Secondary | ICD-10-CM

## 2023-10-16 DIAGNOSIS — H35033 Hypertensive retinopathy, bilateral: Secondary | ICD-10-CM | POA: Diagnosis not present

## 2023-10-16 DIAGNOSIS — Z794 Long term (current) use of insulin: Secondary | ICD-10-CM

## 2023-11-21 ENCOUNTER — Encounter (INDEPENDENT_AMBULATORY_CARE_PROVIDER_SITE_OTHER): Admitting: Ophthalmology

## 2023-11-21 DIAGNOSIS — H35033 Hypertensive retinopathy, bilateral: Secondary | ICD-10-CM | POA: Diagnosis not present

## 2023-11-21 DIAGNOSIS — Z794 Long term (current) use of insulin: Secondary | ICD-10-CM

## 2023-11-21 DIAGNOSIS — E113513 Type 2 diabetes mellitus with proliferative diabetic retinopathy with macular edema, bilateral: Secondary | ICD-10-CM

## 2023-11-21 DIAGNOSIS — I10 Essential (primary) hypertension: Secondary | ICD-10-CM | POA: Diagnosis not present

## 2023-11-24 ENCOUNTER — Ambulatory Visit: Admitting: Urology

## 2023-12-28 ENCOUNTER — Encounter (INDEPENDENT_AMBULATORY_CARE_PROVIDER_SITE_OTHER): Admitting: Ophthalmology

## 2023-12-28 DIAGNOSIS — Z794 Long term (current) use of insulin: Secondary | ICD-10-CM

## 2023-12-28 DIAGNOSIS — E113513 Type 2 diabetes mellitus with proliferative diabetic retinopathy with macular edema, bilateral: Secondary | ICD-10-CM

## 2023-12-28 DIAGNOSIS — H35033 Hypertensive retinopathy, bilateral: Secondary | ICD-10-CM | POA: Diagnosis not present

## 2023-12-28 DIAGNOSIS — I10 Essential (primary) hypertension: Secondary | ICD-10-CM | POA: Diagnosis not present

## 2024-01-02 ENCOUNTER — Encounter (INDEPENDENT_AMBULATORY_CARE_PROVIDER_SITE_OTHER): Admitting: Ophthalmology

## 2024-01-08 ENCOUNTER — Ambulatory Visit (INDEPENDENT_AMBULATORY_CARE_PROVIDER_SITE_OTHER): Admitting: Urology

## 2024-01-08 ENCOUNTER — Encounter: Payer: Self-pay | Admitting: Urology

## 2024-01-08 VITALS — BP 152/71 | HR 67

## 2024-01-08 DIAGNOSIS — R351 Nocturia: Secondary | ICD-10-CM

## 2024-01-08 DIAGNOSIS — C61 Malignant neoplasm of prostate: Secondary | ICD-10-CM | POA: Diagnosis not present

## 2024-01-08 DIAGNOSIS — N5201 Erectile dysfunction due to arterial insufficiency: Secondary | ICD-10-CM | POA: Diagnosis not present

## 2024-01-08 LAB — URINALYSIS, ROUTINE W REFLEX MICROSCOPIC
Bilirubin, UA: NEGATIVE
Glucose, UA: NEGATIVE
Ketones, UA: NEGATIVE
Leukocytes,UA: NEGATIVE
Nitrite, UA: NEGATIVE
Protein,UA: NEGATIVE
RBC, UA: NEGATIVE
Specific Gravity, UA: 1.01 (ref 1.005–1.030)
Urobilinogen, Ur: 0.2 mg/dL (ref 0.2–1.0)
pH, UA: 6.5 (ref 5.0–7.5)

## 2024-01-08 NOTE — Patient Instructions (Signed)

## 2024-01-08 NOTE — Progress Notes (Unsigned)
 01/08/2024 11:37 AM   Alm LITTIE Douglas [name]. Dec 18, 1950 [id number]  Referring provider: Roni [facility] [address] [place],  [place] [address]  No chief complaint on file.   HPI: PSA 0.9 which is stable. He uses sildenafil  50mg  PRN with good results. IPSS 2 QOL 0 on no BPH medication. Nocturia 0x.    PMH: Past Medical History:  Diagnosis Date   COPD (chronic obstructive pulmonary disease) (HCC)    no inhalers used   DM type 1 (diabetes mellitus, type 1) (HCC)    Dyspnea    with heavy exertion   Glaucoma    Hypercholesteremia    Hypertension    Prostate cancer [facility])    Wears glasses    for reading    Surgical History: Past Surgical History:  Procedure Laterality Date   AIR/FLUID EXCHANGE Left 01/26/2021   Procedure: AIR/FLUID EXCHANGE;  Surgeon: Alvia Norleen BIRCH, MD;  Location: [facility] OR;  Service: Ophthalmology;  Laterality: Left;   AIR/FLUID EXCHANGE Right 07/06/2021   Procedure: AIR/FLUID EXCHANGE;  Surgeon: Alvia Norleen BIRCH, MD;  Location: [facility] OR;  Service: Ophthalmology;  Laterality: Right;   ANTERIOR CHAMBER WASHOUT Left 03/09/2022   Procedure: ANTERIOR CHAMBER WASHOUT LEFT EYE;  Surgeon: Alvia Norleen BIRCH, MD;  Location: [facility] OR;  Service: Ophthalmology;  Laterality: Left;   ANTERIOR CHAMBER WASHOUT Right 03/25/2022   Procedure: RIGHT EYE ANTERIOR CHAMBER WASHOUT;  Surgeon: Alvia Norleen BIRCH, MD;  Location: [facility] OR;  Service: Ophthalmology;  Laterality: Right;   COLONOSCOPY N/A 04/05/2013   Procedure: COLONOSCOPY;  Surgeon: Margo LITTIE Haddock, MD;  [facility];  Service: Endoscopy;  Laterality: N/A;  8:30 AM   CYSTOSCOPY N/A 07/20/2020   Procedure: CYSTOSCOPY;  Surgeon: Sherrilee Belvie LITTIE, MD;  Location: [facility];  Service: Urology;  Laterality: N/A;  NO SEEDS DETECTED BY DR. SHERRILEE   INTRAOCULAR LENS REMOVAL Left 01/26/2021   Procedure: REMOVAL OF INTRAOCULAR LENS FROM THE VITREOUS;  Surgeon: Alvia Norleen BIRCH, MD;  Location: [facility] OR;   Service: Ophthalmology;  Laterality: Left;   IRIDECTOMY Right 03/25/2022   Procedure: IRIDECTOMY RIGHT EYE;  Surgeon: Alvia Norleen BIRCH, MD;  Location: [facility] OR;  Service: Ophthalmology;  Laterality: Right;   PARS PLANA VITRECTOMY Left 01/26/2021   Procedure: PARS PLANA VITRECTOMY WITH 25G;  Surgeon: Alvia Norleen BIRCH, MD;  Location: [facility] OR;  Service: Ophthalmology;  Laterality: Left;   PARS PLANA VITRECTOMY Right 07/06/2021   Procedure: TWENTY-FIVE GAUGE PARS PLANA VITRECTOMY WITH REMOVAL/SUTURE INTRAOCULAR LENS;  Surgeon: Alvia Norleen BIRCH, MD;  Location: [facility] OR;  Service: Ophthalmology;  Laterality: Right;   PHOTOCOAGULATION WITH LASER Right 07/06/2021   Procedure: PHOTOCOAGULATION WITH LASER;  Surgeon: Alvia Norleen BIRCH, MD;  Location: [facility] OR;  Service: Ophthalmology;  Laterality: Right;   PLACEMENT AND SUTURE OF SECONDARY INTRAOCULAR LENS Left 01/26/2021   Procedure: PLACEMENT AND SUTURE OF SECONDARY INTRAOCULAR LENS;  Surgeon: Alvia Norleen BIRCH, MD;  Location: [facility] OR;  Service: Ophthalmology;  Laterality: Left;   PROSTATE BIOPSY     RADIOACTIVE SEED IMPLANT N/A 07/20/2020   Procedure: RADIOACTIVE SEED IMPLANT/BRACHYTHERAPY IMPLANT;  Surgeon: Sherrilee Belvie LITTIE, MD;  Location: [facility];  Service: Urology;  Laterality: N/A;  54 SEEDS IMPLANTED   SPACE OAR INSTILLATION N/A 07/20/2020   Procedure: SPACE OAR INSTILLATION;  Surgeon: Sherrilee Belvie LITTIE, MD;  Location: [facility];  Service: Urology;  Laterality: N/A;    Home Medications:  Allergies as of 01/08/2024   No  Known Allergies      Medication List        Accurate as of January 08, 2024 11:37 AM. If you have any questions, ask your nurse or doctor.          atorvastatin  40 MG tablet Commonly known as: LIPITOR Take 40 mg by mouth in the morning.   bacitracin -polymyxin b  ophthalmic ointment Commonly known as: POLYSPORIN  Place into the right eye 3 (three) times daily. apply to eye every 12 hours while  awake   brimonidine  0.2 % ophthalmic solution Commonly known as: ALPHAGAN  Place 1 drop into both eyes 2 (two) times daily. What changed: when to take this   dorzolamide  2 % ophthalmic solution Commonly known as: TRUSOPT  Place 1 drop into both eyes 2 (two) times daily. What changed: when to take this   dorzolamide  2 % ophthalmic solution Commonly known as: TRUSOPT  Place 1 drop into the right eye 3 (three) times daily. What changed: Another medication with the same name was changed. Make sure you understand how and when to take each.   enalapril  10 MG tablet Commonly known as: VASOTEC  Take 10 mg by mouth in the morning.   gatifloxacin  0.5 % Soln Commonly known as: ZYMAXID  Place 1 drop into the right eye 4 (four) times daily.   hydrochlorothiazide  25 MG tablet Commonly known as: HYDRODIURIL  Take 25 mg by mouth in the morning.   insulin  lispro 100 UNIT/ML KwikPen Commonly known as: HUMALOG  Inject 5-25 Units into the skin with breakfast, with lunch, and with evening meal. Sliding scale as needed 1 to 6 carb ratio   Lantus  SoloStar 100 UNIT/ML Solostar Pen Generic drug: insulin  glargine Inject 16 Units into the skin at bedtime.   multivitamin tablet Take 1 tablet by mouth in the morning.   prednisoLONE  acetate 1 % ophthalmic suspension Commonly known as: PRED FORTE  Place 1 drop into the left eye 4 (four) times daily. What changed: when to take this   prednisoLONE  acetate 1 % ophthalmic suspension Commonly known as: PRED FORTE  Place 1 drop into the right eye 4 (four) times daily. What changed: Another medication with the same name was changed. Make sure you understand how and when to take each.   sildenafil  50 MG tablet Commonly known as: VIAGRA  Take 1 tablet (50 mg total) by mouth daily as needed for erectile dysfunction.   timolol  0.25 % ophthalmic solution Commonly known as: TIMOPTIC  Place 1 drop into both eyes 2 (two) times daily.   trimethoprim -polymyxin b   ophthalmic solution Commonly known as: POLYTRIM  Place 1 drop into the left eye 3 (three) times daily.        Allergies: No Known Allergies  Family History: Family History  Problem Relation Age of Onset   Breast cancer Mother    Colon cancer Neg Hx    Pancreatic cancer Neg Hx    Prostate cancer Neg Hx     Social History:  reports that he has been smoking cigarettes. He has a 35 pack-year smoking history. He has never used smokeless tobacco. He reports that he does not drink alcohol and does not use drugs.  ROS: All other review of systems were reviewed and are negative except what is noted above in HPI  Physical Exam: BP (!) 152/71   Pulse 67   Constitutional:  Alert and oriented, No acute distress. HEENT: [place] AT, moist mucus membranes.  Trachea midline, no masses. Cardiovascular: No clubbing, cyanosis, or edema. Respiratory: Normal respiratory effort, no increased work of breathing.  GI: Abdomen is soft, nontender, nondistended, no abdominal masses GU: No CVA tenderness.  Lymph: No cervical or inguinal lymphadenopathy. Skin: No rashes, bruises or suspicious lesions. Neurologic: Grossly intact, no focal deficits, moving all 4 extremities. Psychiatric: Normal mood and affect.  Laboratory Data: Lab Results  Component Value Date   WBC 8.6 03/09/2022   HGB 16.0 03/09/2022   HCT 43.9 03/09/2022   MCV 92.8 03/09/2022   PLT 282 03/09/2022    Lab Results  Component Value Date   CREATININE 0.80 03/09/2022    No results found for: PSA  No results found for: TESTOSTERONE  Lab Results  Component Value Date   HGBA1C 6.8 (H) 03/25/2022    Urinalysis    Component Value Date/Time   COLORURINE AMBER (A) 08/01/2020 0838   APPEARANCEUR Clear 03/08/2023 1328   LABSPEC 1.009 08/01/2020 0838   PHURINE 6.0 08/01/2020 0838   GLUCOSEU Negative 03/08/2023 1328   HGBUR MODERATE (A) 08/01/2020 0838   BILIRUBINUR Negative 03/08/2023 1328   KETONESUR 20 (A) 08/01/2020 0838    PROTEINUR Negative 03/08/2023 1328   PROTEINUR 100 (A) 08/01/2020 0838   NITRITE Negative 03/08/2023 1328   NITRITE NEGATIVE 08/01/2020 0838   LEUKOCYTESUR Negative 03/08/2023 1328   LEUKOCYTESUR LARGE (A) 08/01/2020 0838    Lab Results  Component Value Date   LABMICR Comment 03/08/2023   WBCUA >30 (A) 09/28/2020   LABEPIT 0-10 09/28/2020   MUCUS Present 09/28/2020   BACTERIA Many (A) 09/28/2020    Pertinent Imaging: *** No results found for this or any previous visit.  No results found for this or any previous visit.  No results found for this or any previous visit.  No results found for this or any previous visit.  No results found for this or any previous visit.  No results found for this or any previous visit.  No results found for this or any previous visit.  No results found for this or any previous visit.   Assessment & Plan:    1. Prostate cancer (HCC) (Primary) *** - Urinalysis, Routine w reflex microscopic  2. Nocturia ***  3. Erectile dysfunction due to arterial insufficiency ***   No follow-ups on file.  Belvie Clara, MD  [facility] [place]

## 2024-02-08 ENCOUNTER — Encounter (INDEPENDENT_AMBULATORY_CARE_PROVIDER_SITE_OTHER): Admitting: Ophthalmology

## 2024-02-09 ENCOUNTER — Encounter (INDEPENDENT_AMBULATORY_CARE_PROVIDER_SITE_OTHER): Admitting: Ophthalmology

## 2024-02-09 DIAGNOSIS — I10 Essential (primary) hypertension: Secondary | ICD-10-CM | POA: Diagnosis not present

## 2024-02-09 DIAGNOSIS — Z794 Long term (current) use of insulin: Secondary | ICD-10-CM | POA: Diagnosis not present

## 2024-02-09 DIAGNOSIS — H35033 Hypertensive retinopathy, bilateral: Secondary | ICD-10-CM

## 2024-02-09 DIAGNOSIS — E103513 Type 1 diabetes mellitus with proliferative diabetic retinopathy with macular edema, bilateral: Secondary | ICD-10-CM

## 2024-03-22 ENCOUNTER — Encounter (INDEPENDENT_AMBULATORY_CARE_PROVIDER_SITE_OTHER): Admitting: Ophthalmology

## 2024-03-22 DIAGNOSIS — H35033 Hypertensive retinopathy, bilateral: Secondary | ICD-10-CM

## 2024-03-22 DIAGNOSIS — E113513 Type 2 diabetes mellitus with proliferative diabetic retinopathy with macular edema, bilateral: Secondary | ICD-10-CM

## 2024-03-22 DIAGNOSIS — Z794 Long term (current) use of insulin: Secondary | ICD-10-CM

## 2024-03-22 DIAGNOSIS — I10 Essential (primary) hypertension: Secondary | ICD-10-CM

## 2024-05-03 ENCOUNTER — Encounter (INDEPENDENT_AMBULATORY_CARE_PROVIDER_SITE_OTHER): Admitting: Ophthalmology

## 2024-05-03 DIAGNOSIS — Z794 Long term (current) use of insulin: Secondary | ICD-10-CM

## 2024-05-03 DIAGNOSIS — H35033 Hypertensive retinopathy, bilateral: Secondary | ICD-10-CM

## 2024-05-03 DIAGNOSIS — I10 Essential (primary) hypertension: Secondary | ICD-10-CM

## 2024-05-03 DIAGNOSIS — E113513 Type 2 diabetes mellitus with proliferative diabetic retinopathy with macular edema, bilateral: Secondary | ICD-10-CM

## 2024-06-14 ENCOUNTER — Encounter (INDEPENDENT_AMBULATORY_CARE_PROVIDER_SITE_OTHER): Admitting: Ophthalmology

## 2024-07-02 ENCOUNTER — Other Ambulatory Visit

## 2024-07-10 ENCOUNTER — Ambulatory Visit: Admitting: Urology
